# Patient Record
Sex: Female | Born: 1945 | Race: White | Hispanic: No | Marital: Married | Smoking: Former smoker
Health system: Southern US, Community
[De-identification: ages and names within clinical notes are randomized; demographics above are authoritative.]

## PROBLEM LIST (undated history)

## (undated) DIAGNOSIS — R42 Dizziness and giddiness: Secondary | ICD-10-CM

## (undated) DIAGNOSIS — Z9221 Personal history of antineoplastic chemotherapy: Secondary | ICD-10-CM

## (undated) DIAGNOSIS — R87619 Unspecified abnormal cytological findings in specimens from cervix uteri: Secondary | ICD-10-CM

## (undated) DIAGNOSIS — M810 Age-related osteoporosis without current pathological fracture: Secondary | ICD-10-CM

## (undated) DIAGNOSIS — Z87891 Personal history of nicotine dependence: Principal | ICD-10-CM

## (undated) DIAGNOSIS — K219 Gastro-esophageal reflux disease without esophagitis: Secondary | ICD-10-CM

## (undated) DIAGNOSIS — M1990 Unspecified osteoarthritis, unspecified site: Secondary | ICD-10-CM

## (undated) DIAGNOSIS — F988 Other specified behavioral and emotional disorders with onset usually occurring in childhood and adolescence: Secondary | ICD-10-CM

## (undated) DIAGNOSIS — A6000 Herpesviral infection of urogenital system, unspecified: Secondary | ICD-10-CM

## (undated) DIAGNOSIS — Z9049 Acquired absence of other specified parts of digestive tract: Secondary | ICD-10-CM

## (undated) DIAGNOSIS — E039 Hypothyroidism, unspecified: Secondary | ICD-10-CM

## (undated) DIAGNOSIS — L219 Seborrheic dermatitis, unspecified: Secondary | ICD-10-CM

## (undated) DIAGNOSIS — D649 Anemia, unspecified: Secondary | ICD-10-CM

## (undated) DIAGNOSIS — E785 Hyperlipidemia, unspecified: Secondary | ICD-10-CM

## (undated) DIAGNOSIS — I10 Essential (primary) hypertension: Secondary | ICD-10-CM

## (undated) DIAGNOSIS — C50919 Malignant neoplasm of unspecified site of unspecified female breast: Secondary | ICD-10-CM

## (undated) DIAGNOSIS — M719 Bursopathy, unspecified: Secondary | ICD-10-CM

## (undated) DIAGNOSIS — Z87898 Personal history of other specified conditions: Secondary | ICD-10-CM

## (undated) DIAGNOSIS — K529 Noninfective gastroenteritis and colitis, unspecified: Secondary | ICD-10-CM

## (undated) DIAGNOSIS — M8580 Other specified disorders of bone density and structure, unspecified site: Secondary | ICD-10-CM

## (undated) DIAGNOSIS — B009 Herpesviral infection, unspecified: Secondary | ICD-10-CM

## (undated) HISTORY — DX: Unspecified abnormal cytological findings in specimens from cervix uteri: R87.619

## (undated) HISTORY — DX: Personal history of nicotine dependence: Z87.891

## (undated) HISTORY — PX: TUBAL LIGATION: SHX77

## (undated) HISTORY — PX: CERVICAL BIOPSY  W/ LOOP ELECTRODE EXCISION: SUR135

## (undated) HISTORY — DX: Other specified disorders of bone density and structure, unspecified site: M85.80

## (undated) HISTORY — DX: Herpesviral infection of urogenital system, unspecified: A60.00

## (undated) HISTORY — DX: Essential (primary) hypertension: I10

## (undated) HISTORY — DX: Age-related osteoporosis without current pathological fracture: M81.0

## (undated) HISTORY — PX: ENDOMETRIAL BIOPSY: SHX622

## (undated) HISTORY — DX: Herpesviral infection, unspecified: B00.9

## (undated) HISTORY — DX: Bursopathy, unspecified: M71.9

## (undated) HISTORY — DX: Malignant neoplasm of unspecified site of unspecified female breast: C50.919

## (undated) HISTORY — PX: DILATION AND CURETTAGE OF UTERUS: SHX78

## (undated) HISTORY — PX: APPENDECTOMY: SHX54

## (undated) HISTORY — DX: Anemia, unspecified: D64.9

## (undated) HISTORY — DX: Acquired absence of other specified parts of digestive tract: Z90.49

## (undated) HISTORY — PX: MASTECTOMY: SHX3

## (undated) HISTORY — PX: COSMETIC SURGERY: SHX468

## (undated) HISTORY — PX: TONSILLECTOMY: SUR1361

## (undated) HISTORY — DX: Hyperlipidemia, unspecified: E78.5

## (undated) HISTORY — PX: OTHER SURGICAL HISTORY: SHX169

## (undated) HISTORY — PX: CARDIAC CATHETERIZATION: SHX172

## (undated) HISTORY — DX: Noninfective gastroenteritis and colitis, unspecified: K52.9

---

## 1979-03-24 DIAGNOSIS — Z9049 Acquired absence of other specified parts of digestive tract: Secondary | ICD-10-CM

## 1979-03-24 HISTORY — DX: Acquired absence of other specified parts of digestive tract: Z90.49

## 1980-03-23 HISTORY — PX: AUGMENTATION MAMMAPLASTY: SUR837

## 2003-03-24 HISTORY — PX: BREAST SURGERY: SHX581

## 2004-03-23 HISTORY — PX: BREAST SURGERY: SHX581

## 2004-07-08 ENCOUNTER — Ambulatory Visit: Payer: Self-pay | Admitting: Internal Medicine

## 2004-07-11 ENCOUNTER — Ambulatory Visit: Payer: Self-pay | Admitting: Internal Medicine

## 2004-07-25 ENCOUNTER — Ambulatory Visit: Payer: Self-pay

## 2004-07-31 ENCOUNTER — Ambulatory Visit: Payer: Self-pay | Admitting: Internal Medicine

## 2004-08-05 ENCOUNTER — Ambulatory Visit: Payer: Self-pay | Admitting: Internal Medicine

## 2004-08-26 ENCOUNTER — Ambulatory Visit: Payer: Self-pay | Admitting: Internal Medicine

## 2004-09-01 ENCOUNTER — Ambulatory Visit: Payer: Self-pay | Admitting: Internal Medicine

## 2004-10-14 ENCOUNTER — Other Ambulatory Visit: Admission: RE | Admit: 2004-10-14 | Discharge: 2004-10-14 | Payer: Self-pay | Admitting: Family Medicine

## 2004-11-13 ENCOUNTER — Ambulatory Visit: Payer: Self-pay | Admitting: Internal Medicine

## 2005-02-23 ENCOUNTER — Ambulatory Visit: Payer: Self-pay | Admitting: Internal Medicine

## 2008-03-05 ENCOUNTER — Other Ambulatory Visit: Admission: RE | Admit: 2008-03-05 | Discharge: 2008-03-05 | Payer: Self-pay | Admitting: Obstetrics & Gynecology

## 2008-03-13 ENCOUNTER — Encounter: Admission: RE | Admit: 2008-03-13 | Discharge: 2008-03-13 | Payer: Self-pay | Admitting: Obstetrics & Gynecology

## 2008-03-22 ENCOUNTER — Encounter: Admission: RE | Admit: 2008-03-22 | Discharge: 2008-03-22 | Payer: Self-pay | Admitting: Obstetrics & Gynecology

## 2008-05-23 ENCOUNTER — Ambulatory Visit: Payer: Self-pay

## 2009-02-21 ENCOUNTER — Ambulatory Visit: Payer: Self-pay | Admitting: Family Medicine

## 2009-02-21 DIAGNOSIS — E7800 Pure hypercholesterolemia, unspecified: Secondary | ICD-10-CM | POA: Insufficient documentation

## 2009-02-21 DIAGNOSIS — F900 Attention-deficit hyperactivity disorder, predominantly inattentive type: Secondary | ICD-10-CM | POA: Insufficient documentation

## 2009-02-21 DIAGNOSIS — J309 Allergic rhinitis, unspecified: Secondary | ICD-10-CM | POA: Insufficient documentation

## 2009-03-21 ENCOUNTER — Encounter: Admission: RE | Admit: 2009-03-21 | Discharge: 2009-03-21 | Payer: Self-pay | Admitting: Unknown Physician Specialty

## 2009-03-26 DIAGNOSIS — A6000 Herpesviral infection of urogenital system, unspecified: Secondary | ICD-10-CM | POA: Insufficient documentation

## 2009-03-26 HISTORY — DX: Herpesviral infection of urogenital system, unspecified: A60.00

## 2009-07-01 DIAGNOSIS — I10 Essential (primary) hypertension: Secondary | ICD-10-CM | POA: Insufficient documentation

## 2009-11-13 ENCOUNTER — Inpatient Hospital Stay: Payer: Self-pay | Admitting: Internal Medicine

## 2009-11-13 ENCOUNTER — Ambulatory Visit: Payer: Self-pay | Admitting: Cardiovascular Disease

## 2009-11-14 ENCOUNTER — Encounter: Payer: Self-pay | Admitting: Cardiovascular Disease

## 2009-11-15 ENCOUNTER — Encounter: Payer: Self-pay | Admitting: Cardiovascular Disease

## 2009-11-29 ENCOUNTER — Ambulatory Visit: Payer: Self-pay | Admitting: Family Medicine

## 2009-12-04 ENCOUNTER — Telehealth: Payer: Self-pay | Admitting: Cardiovascular Disease

## 2010-03-26 ENCOUNTER — Encounter
Admission: RE | Admit: 2010-03-26 | Discharge: 2010-03-26 | Payer: Self-pay | Source: Home / Self Care | Attending: Family Medicine | Admitting: Family Medicine

## 2010-04-02 ENCOUNTER — Encounter
Admission: RE | Admit: 2010-04-02 | Discharge: 2010-04-02 | Payer: Self-pay | Source: Home / Self Care | Attending: Family Medicine | Admitting: Family Medicine

## 2010-04-13 ENCOUNTER — Encounter: Payer: Self-pay | Admitting: Obstetrics & Gynecology

## 2010-04-22 NOTE — Progress Notes (Signed)
Summary: Leg  ---- Converted from flag ---- ---- 11/25/2009 1:55 PM, Dossie Arbour MD wrote: Can we call to see how her leg is? She had post cath hematoma. Does she want to be seen for quick look at leg?> ------------------------------  Phone Note Outgoing Call   Call placed by: Benedict Needy, RN,  December 04, 2009 2:18 PM Call placed to: Patient Summary of Call: Attempted to call to check on her leg. Pt has post cath hemotoma. Number is not a working number.  Initial call taken by: Benedict Needy, RN,  December 04, 2009 2:19 PM

## 2010-04-22 NOTE — Cardiovascular Report (Signed)
[facility]   Imported By: Harlon Flor 11/21/2009 14:17:49  _____________________________________________________________________  External Attachment:    Type:   Image     Comment:   External Document

## 2010-04-22 NOTE — Letter (Signed)
[facility]   Imported By: Harlon Flor 11/21/2009 14:18:07  _____________________________________________________________________  External Attachment:    Type:   Image     Comment:   External Document

## 2011-02-20 ENCOUNTER — Other Ambulatory Visit: Payer: Self-pay | Admitting: Family Medicine

## 2011-02-20 DIAGNOSIS — Z1231 Encounter for screening mammogram for malignant neoplasm of breast: Secondary | ICD-10-CM

## 2011-03-30 ENCOUNTER — Ambulatory Visit
Admission: RE | Admit: 2011-03-30 | Discharge: 2011-03-30 | Disposition: A | Discharge status: Home / Self Care | Payer: Federal, State, Local not specified - PPO | Source: Ambulatory Visit | Attending: Family Medicine | Admitting: Family Medicine

## 2011-03-30 DIAGNOSIS — Z1231 Encounter for screening mammogram for malignant neoplasm of breast: Secondary | ICD-10-CM

## 2012-02-16 ENCOUNTER — Ambulatory Visit: Payer: Self-pay

## 2012-02-25 ENCOUNTER — Other Ambulatory Visit: Payer: Self-pay | Admitting: Family Medicine

## 2012-02-25 DIAGNOSIS — Z1231 Encounter for screening mammogram for malignant neoplasm of breast: Secondary | ICD-10-CM

## 2012-03-10 ENCOUNTER — Ambulatory Visit: Payer: Self-pay

## 2012-04-06 ENCOUNTER — Ambulatory Visit
Admission: RE | Admit: 2012-04-06 | Discharge: 2012-04-06 | Disposition: A | Discharge status: Home / Self Care | Payer: Federal, State, Local not specified - PPO | Source: Ambulatory Visit | Attending: Family Medicine | Admitting: Family Medicine

## 2012-04-06 DIAGNOSIS — Z1231 Encounter for screening mammogram for malignant neoplasm of breast: Secondary | ICD-10-CM

## 2012-05-04 ENCOUNTER — Ambulatory Visit: Payer: Self-pay | Admitting: Family Medicine

## 2012-05-04 LAB — URINALYSIS, COMPLETE
Bilirubin,UR: NEGATIVE
Glucose,UR: NEGATIVE mg/dL (ref 0–75)
Ketone: NEGATIVE
Ph: 7 (ref 4.5–8.0)
Specific Gravity: 1.01 (ref 1.003–1.030)

## 2012-06-17 ENCOUNTER — Encounter: Payer: Self-pay | Admitting: *Deleted

## 2012-06-20 ENCOUNTER — Encounter: Payer: Self-pay | Admitting: Gynecology

## 2012-06-20 ENCOUNTER — Other Ambulatory Visit: Payer: Self-pay | Admitting: Gynecology

## 2012-06-20 ENCOUNTER — Ambulatory Visit (INDEPENDENT_AMBULATORY_CARE_PROVIDER_SITE_OTHER): Payer: Federal, State, Local not specified - PPO | Admitting: Gynecology

## 2012-06-20 VITALS — BP 130/64 | Ht 63.78 in | Wt 147.0 lb

## 2012-06-20 DIAGNOSIS — Z87898 Personal history of other specified conditions: Secondary | ICD-10-CM

## 2012-06-20 DIAGNOSIS — Z01419 Encounter for gynecological examination (general) (routine) without abnormal findings: Secondary | ICD-10-CM

## 2012-06-20 DIAGNOSIS — N952 Postmenopausal atrophic vaginitis: Secondary | ICD-10-CM

## 2012-06-20 DIAGNOSIS — Z8742 Personal history of other diseases of the female genital tract: Secondary | ICD-10-CM

## 2012-06-20 DIAGNOSIS — Z853 Personal history of malignant neoplasm of breast: Secondary | ICD-10-CM

## 2012-06-20 MED ORDER — ESTRADIOL 10 MCG VA TABS: 10.0000 ug | ORAL_TABLET | VAGINAL | Status: DC

## 2012-06-20 NOTE — Progress Notes (Addendum)
67 y.o. MarriedCaucasian female   G1P1001 here for annual exam.  Overall doing well, no postmeopausal bleeding, no dysparunia  Patient's last menstrual period was 03/23/1994.          Sexually active: yes  The current method of family planning is post menopausal status.    Exercising: walk; golf, yoga 3x/wk Last mammogram:  1/14 Last pap smear: 06/15/11 History of abnormal pap: no Smoking: No Alcohol: No Last colonoscopy: 2007 f/u in 10 years Last Bone Density:  2009 Last tetanus shot: 2013 Last cholesterol check: 2013 BSE: yes  Hgb:  PCP             Urine: PCP    Health Maintenance  Topic Date Due  . Influenza Vaccine  11/21/2012  . Mammogram  04/06/2013  . Colonoscopy  03/24/2015  . Pap Smear  06/21/2015  . Tetanus/tdap  08/21/2020  . Zostavax  Completed    Family History  Problem Relation Age of Onset  . Breast cancer Mother 73  . Heart disease Father   . Skin cancer Brother     Patient Active Problem List  Diagnosis  . Encounter for routine gynecological examination  . Personal history of malignant neoplasm of breast  . History of abnormal Pap smear    Past Medical History  Diagnosis Date  . HSV-2 (herpes simplex virus 2) infection   . Hyperlipidemia   . Hypertension   . Breast cancer     Left; Age 67  . S/P appendectomy 1981  . Anemia     Past Surgical History  Procedure Laterality Date  . Tonsillectomy  child  . Tubal ligation    . Cesarean section  1985  . Mastectomy Left     new implant on the Right  . Augmentation mammaplasty  1982  . Breast surgery  2005    Breast Implant replaceddue to leaking  . Breast surgery Left 2006    due to asymmetry  . Cervical biopsy  w/ loop electrode excision      unsure pathology     Allergies: Review of patient's allergies indicates no known allergies.  Current Outpatient Prescriptions  Medication Sig Dispense Refill  . amphetamine-dextroamphetamine (ADDERALL) 10 MG tablet Take 10 mg by mouth daily.       . Ascorbic Acid (VITAMIN C PO) Take by mouth.      . cholecalciferol (VITAMIN D) 1000 UNITS tablet Take 1,000 Units by mouth daily.      . cyanocobalamin (,VITAMIN B-12,) 1000 MCG/ML injection Inject 1,000 mcg into the muscle once a week.      . Estradiol (VAGIFEM) 10 MCG TABS Place 1 tablet (10 mcg total) vaginally 2 (two) times a week.  26 tablet  3  . IRON PO Take by mouth.      . losartan (COZAAR) 50 MG tablet Take 50 mg by mouth daily.      . Multiple Vitamins-Minerals (MULTIVITAMIN PO) Take by mouth.      . Probiotic Product (PROBIOTIC DAILY PO) Take by mouth.      . ValACYclovir HCl (VALTREX PO) Take by mouth as needed.      Marland Kitchen amoxicillin-clavulanate (AUGMENTIN) 875-125 MG per tablet       . hydrOXYzine (ATARAX/VISTARIL) 25 MG tablet        No current facility-administered medications for this visit.    ROS: Pertinent items are noted in HPI.  Exam:    BP 130/64  Ht 5' 3.78" (1.62 m)  Wt 147 lb [phone]  kg)  BMI 25.41 kg/m2  LMP 03/23/1994   Wt Readings from Last 3 Encounters:  06/20/12 147 lb (66.679 kg)     Ht Readings from Last 3 Encounters:  06/20/12 5' 3.78" (1.62 m)    General appearance: alert, cooperative and appears stated age Head: Normocephalic, without obvious abnormality, atraumatic Neck: no adenopathy, supple, symmetrical, trachea midline and thyroid not enlarged, symmetric, no tenderness/mass/nodules Lungs: clear to auscultation bilaterally Breasts:Right s/p implant-no mass, discharge or skin changes No nipple retraction or dimpling, No nipple discharge or bleeding, No axillary or supraclavicular adenopathy, Normal to palpation without dominant masses.  Left surgically absent-no subclavicular masses or skin changes Heart: regular rate and rhythm Abdomen: soft, non-tender; bowel sounds normal; no masses,  no organomegaly Extremities: extremities normal, atraumatic, no cyanosis or edema Skin: Skin color, texture, turgor normal. No rashes or lesions Lymph  nodes: Cervical, supraclavicular, and axillary nodes normal. No abnormal inguinal nodes palpated Neurologic: Grossly normal   Pelvic: External genitalia:  no lesions              Urethra:  normal appearing urethra with no masses, tenderness or lesions              Bartholins and Skenes: normal                 Vagina: normal appearing vagina with normal color and discharge, no lesions              Cervix: markedly scarred c/w prior LEEP,               Pap taken: yes        Bimanual Exam:  Uterus:  uterus is normal size, shape, consistency and nontender retroverted                                      Adnexa: no fullness                                      Rectovaginal: Confirms                                      Anus:  normal sphincter tone, no lesions  A: well woman History of abnormal PAP     P: mammogram pap smear with HR HPV S/p LEEP, almost 20y ago, will get records regarding path, if unavailable will continue PAP qyear x20 return annually or prn     An After Visit Summary was printed and given to the patient.  Pt called to update her history regarding dates on Tdap, zovirax, DEXA. Additionally pt reports having BRCA1/2 testing done 12/13-both negative 2003 cx bx-negative for CIN done in [place]

## 2012-06-20 NOTE — Patient Instructions (Signed)

## 2012-06-22 LAB — IPS PAP TEST WITH REFLEX TO HPV

## 2012-06-23 ENCOUNTER — Other Ambulatory Visit: Payer: Self-pay | Admitting: Gynecology

## 2012-06-23 NOTE — Addendum Note (Signed)
Addended by: Douglass Rivers on: 06/23/2012 01:57 PM   Modules accepted: Orders

## 2012-06-24 ENCOUNTER — Encounter: Payer: Self-pay | Admitting: Gynecology

## 2012-06-27 ENCOUNTER — Encounter: Payer: Self-pay | Admitting: Gynecology

## 2012-06-27 LAB — IPS HPV ON A LIQUID BASED SPECIMEN

## 2012-07-01 NOTE — Telephone Encounter (Signed)
Pt wanting to know if she should come back in because she had an abnormal pap result.

## 2012-07-04 ENCOUNTER — Encounter: Payer: Self-pay | Admitting: Gynecology

## 2012-07-07 ENCOUNTER — Encounter: Payer: Self-pay | Admitting: Gynecology

## 2012-08-09 ENCOUNTER — Ambulatory Visit (INDEPENDENT_AMBULATORY_CARE_PROVIDER_SITE_OTHER): Payer: Federal, State, Local not specified - PPO | Admitting: Gynecology

## 2012-08-09 VITALS — BP 130/76 | Resp 18 | Wt 149.0 lb

## 2012-08-09 DIAGNOSIS — N762 Acute vulvitis: Secondary | ICD-10-CM

## 2012-08-09 DIAGNOSIS — N760 Acute vaginitis: Secondary | ICD-10-CM

## 2012-08-09 LAB — POCT WET PREP (WET MOUNT)

## 2012-08-09 MED ORDER — DESONIDE 0.05 % EX OINT: TOPICAL_OINTMENT | Freq: Two times a day (BID) | CUTANEOUS | Status: DC

## 2012-08-09 NOTE — Progress Notes (Signed)
67 y.o.Married Caucasian female G1P1001 with a 2 week(s) history of the following:blisters, dyspareunia and local irritation Sexually active: yes Last sexual activity:2days ago. Pt also reports the following associated symptoms: none pt reports recent UTI, seen by PCP and rx with amoxicillin, reports initially got better but reports a second infection about 1w later treated without being seen also noted a single vesicle at that time.  Pt was also given an rx for a vaginal cream for yeast infection.  Pt has had HSV for years, last outbreak about 78m before, pt treated with valtrex 500mg  BID x3d then continued QD.       Exam:  Ext:see diagram of ext genitalia                     Vag:no lesions, discharge: normal and physiologic, pH 4.5, wet prep done                Cx:  scarred                Uterus:not examined                Adnexa: not evaluated  Wet Prep shows:vaginal discharge   ZO:XWRUEA vulvar lesion   VW:[id number] local care discussed. topical steroid to area F/u 10-14d

## 2012-08-09 NOTE — Patient Instructions (Signed)
Pea size amount of steroid to affected area.  EVOO or cocoanut oil prn

## 2013-01-04 ENCOUNTER — Ambulatory Visit: Payer: Self-pay | Admitting: Family Medicine

## 2013-01-09 ENCOUNTER — Other Ambulatory Visit: Payer: Self-pay

## 2013-01-09 NOTE — Telephone Encounter (Signed)
aex was 06-20-12 7 no rx was given then. Please approve or deny rx

## 2013-01-10 MED ORDER — VALACYCLOVIR HCL 500 MG PO TABS: 500.0000 mg | ORAL_TABLET | Freq: Two times a day (BID) | ORAL | Status: DC

## 2013-01-10 NOTE — Telephone Encounter (Signed)
Per Dr. Farrel Gobble patient only needs 30 since she only takes Valtrex 3 times a day @ onset of outbreak. If she needs more than that she should be taking it daily.  #30/0rfs sent to pharmacy.

## 2013-01-10 NOTE — Telephone Encounter (Signed)
done

## 2013-01-26 ENCOUNTER — Other Ambulatory Visit: Payer: Self-pay

## 2013-03-10 ENCOUNTER — Other Ambulatory Visit: Payer: Self-pay

## 2013-03-10 DIAGNOSIS — Z1231 Encounter for screening mammogram for malignant neoplasm of breast: Secondary | ICD-10-CM

## 2013-04-06 ENCOUNTER — Other Ambulatory Visit: Payer: Self-pay

## 2013-04-06 DIAGNOSIS — Z1231 Encounter for screening mammogram for malignant neoplasm of breast: Secondary | ICD-10-CM

## 2013-04-06 DIAGNOSIS — Z9882 Breast implant status: Secondary | ICD-10-CM

## 2013-04-18 DIAGNOSIS — Z9889 Other specified postprocedural states: Secondary | ICD-10-CM | POA: Insufficient documentation

## 2013-04-26 ENCOUNTER — Other Ambulatory Visit: Payer: Self-pay | Admitting: Family Medicine

## 2013-04-26 ENCOUNTER — Ambulatory Visit
Admission: RE | Admit: 2013-04-26 | Discharge: 2013-04-26 | Disposition: A | Discharge status: Home / Self Care | Payer: Medicare Other | Source: Ambulatory Visit

## 2013-04-26 DIAGNOSIS — Z9882 Breast implant status: Secondary | ICD-10-CM

## 2013-04-26 DIAGNOSIS — Z1231 Encounter for screening mammogram for malignant neoplasm of breast: Secondary | ICD-10-CM

## 2013-04-26 DIAGNOSIS — N63 Unspecified lump in unspecified breast: Secondary | ICD-10-CM

## 2013-04-26 DIAGNOSIS — Z9012 Acquired absence of left breast and nipple: Secondary | ICD-10-CM

## 2013-05-05 ENCOUNTER — Ambulatory Visit
Admission: RE | Admit: 2013-05-05 | Discharge: 2013-05-05 | Disposition: A | Discharge status: Home / Self Care | Payer: Federal, State, Local not specified - PPO | Source: Ambulatory Visit | Attending: Family Medicine | Admitting: Family Medicine

## 2013-05-05 ENCOUNTER — Other Ambulatory Visit: Payer: Self-pay | Admitting: Family Medicine

## 2013-05-05 DIAGNOSIS — N6310 Unspecified lump in the right breast, unspecified quadrant: Secondary | ICD-10-CM

## 2013-05-05 DIAGNOSIS — N63 Unspecified lump in unspecified breast: Secondary | ICD-10-CM

## 2013-05-05 DIAGNOSIS — Z9012 Acquired absence of left breast and nipple: Secondary | ICD-10-CM

## 2013-05-18 ENCOUNTER — Other Ambulatory Visit: Payer: Self-pay | Admitting: Gynecology

## 2013-05-19 NOTE — Telephone Encounter (Signed)
Last AEX 06/20/12 Last refill 01/09/13 #30/0 refill Next appt 07/06/13  Please approve or deny rx.

## 2013-05-21 DIAGNOSIS — R87619 Unspecified abnormal cytological findings in specimens from cervix uteri: Secondary | ICD-10-CM

## 2013-05-21 HISTORY — DX: Unspecified abnormal cytological findings in specimens from cervix uteri: R87.619

## 2013-06-26 ENCOUNTER — Ambulatory Visit: Payer: Federal, State, Local not specified - PPO | Admitting: Gynecology

## 2013-07-11 ENCOUNTER — Ambulatory Visit: Payer: Federal, State, Local not specified - PPO | Admitting: Certified Nurse Midwife

## 2013-07-25 ENCOUNTER — Encounter: Payer: Self-pay | Admitting: Certified Nurse Midwife

## 2013-07-25 ENCOUNTER — Ambulatory Visit (INDEPENDENT_AMBULATORY_CARE_PROVIDER_SITE_OTHER): Payer: Federal, State, Local not specified - PPO | Admitting: Certified Nurse Midwife

## 2013-07-25 VITALS — BP 120/70 | HR 68 | Resp 16 | Ht 62.75 in | Wt 137.0 lb

## 2013-07-25 DIAGNOSIS — Z01419 Encounter for gynecological examination (general) (routine) without abnormal findings: Secondary | ICD-10-CM

## 2013-07-25 DIAGNOSIS — Z1211 Encounter for screening for malignant neoplasm of colon: Secondary | ICD-10-CM

## 2013-07-25 DIAGNOSIS — N952 Postmenopausal atrophic vaginitis: Secondary | ICD-10-CM

## 2013-07-25 DIAGNOSIS — Z0000 Encounter for general adult medical examination without abnormal findings: Secondary | ICD-10-CM

## 2013-07-25 LAB — POCT URINALYSIS DIPSTICK
Bilirubin, UA: NEGATIVE
Glucose, UA: NEGATIVE
KETONES UA: NEGATIVE
Leukocytes, UA: NEGATIVE
Nitrite, UA: NEGATIVE
PH UA: 5
Protein, UA: NEGATIVE
RBC UA: NEGATIVE
Urobilinogen, UA: NEGATIVE

## 2013-07-25 MED ORDER — ESTRADIOL 10 MCG VA TABS: 10.0000 ug | ORAL_TABLET | VAGINAL | Status: DC

## 2013-07-25 NOTE — Progress Notes (Signed)
68 y.o. G10P1001 Married Caucasian Fe here for annual exam.  Menopausal no HRT, denies vaginal bleeding, using Vagifem for vaginal dryness with good result. Mother injured in [place] 01/19/13. Had numerous fractures, but doing well. Stressful time for patient. Sees PCP for aex and medication management, and labs, no medication changes from PCP. No other health issues today.  Patient's last menstrual period was 03/23/1994.          Sexually active: yes  The current method of family planning is tubal ligation.    Exercising: yes  golf, gardening & walking Smoker:  no  Health Maintenance: Pap: 06-20-12 ASCUS HPV- MMG:  2015 rt breast negative Colonoscopy: 2007 f/u 8yrs BMD:   2015 TDaP:  2011 Labs: Poct urine-neg Self breast exam: done occ   reports that she has quit smoking. She does not have any smokeless tobacco history on file. She reports that she does not drink alcohol or use illicit drugs.  Past Medical History  Diagnosis Date  . HSV-2 (herpes simplex virus 2) infection   . Hyperlipidemia   . Hypertension   . Breast cancer     Left; Age 14  . S/P appendectomy 1981  . Anemia   . Abnormal Pap smear of cervix 3/15    ASCUS HPV not detected    Past Surgical History  Procedure Laterality Date  . Tonsillectomy  child  . Tubal ligation    . Cesarean section  1985  . Mastectomy Left     new implant on the Right  . Augmentation mammaplasty  1982  . Breast surgery  2005    Breast Implant replaceddue to leaking  . Breast surgery Left 2006    due to asymmetry  . Cervical biopsy  w/ loop electrode excision      unsure pathology   . Appendectomy      Current Outpatient Prescriptions  Medication Sig Dispense Refill  . ALPRAZolam (XANAX) 0.5 MG tablet at bedtime.      Marland Kitchen amphetamine-dextroamphetamine (ADDERALL) 20 MG tablet Take 20 mg by mouth daily.      . Ascorbic Acid (VITAMIN C PO) Take by mouth.      . cholecalciferol (VITAMIN D) 1000 UNITS tablet Take 1,000 Units by mouth  daily.      . cyanocobalamin (,VITAMIN B-12,) 1000 MCG/ML injection Inject 1,000 mcg into the muscle once a week.      . desonide (DESOWEN) 0.05 % ointment Apply topically as needed.      . Estradiol (VAGIFEM) 10 MCG TABS Place 1 tablet (10 mcg total) vaginally 2 (two) times a week.  26 tablet  3  . IRON PO Take by mouth.      . losartan (COZAAR) 50 MG tablet Take 50 mg by mouth daily.      . Multiple Vitamins-Minerals (MULTIVITAMIN PO) Take by mouth.      . S-Adenosylmethionine (SAM-E PO) Take by mouth daily.      . valACYclovir (VALTREX) 500 MG tablet TAKE 1 TABLET BY MOUTH 2 TIMES A DAY FOR 3 DAYS AT ONSET  30 tablet  0   No current facility-administered medications for this visit.    Family History  Problem Relation Age of Onset  . Breast cancer Mother 80  . Heart disease Father   . Skin cancer Brother   . Crohn's disease Brother     ROS:  Pertinent items are noted in HPI.  Otherwise, a comprehensive ROS was negative.  Exam:   BP 120/70  Pulse  68  Resp 16  Ht 5' 2.75" (1.594 m)  Wt 137 lb (62.143 kg)  BMI 24.46 kg/m2  LMP 03/23/1994 Height: 5' 2.75" (159.4 cm)  Ht Readings from Last 3 Encounters:  07/25/13 5' 2.75" (1.594 m)  06/20/12 5' 3.78" (1.62 m)    General appearance: alert, cooperative and appears stated age Head: Normocephalic, without obvious abnormality, atraumatic Neck: no adenopathy, supple, symmetrical, trachea midline and thyroid normal to inspection and palpation Lungs: clear to auscultation bilaterally Breasts: normal appearance, no masses or tenderness, No nipple retraction or dimpling, No nipple discharge or bleeding, No axillary or supraclavicular adenopathy on right with implant noted. Left transflap with tattoo, no change in skin or masses noted, implant noted intact. Heart: regular rate and rhythm Abdomen: soft, non-tender; no masses,  no organomegaly Extremities: extremities normal, atraumatic, no cyanosis or edema Skin: Skin color, texture,  turgor normal. No rashes or lesions Lymph nodes: Cervical, supraclavicular, and axillary nodes normal. No abnormal inguinal nodes palpated Neurologic: Grossly normal   Pelvic: External genitalia:  no lesions              Urethra:  normal appearing urethra with no masses, tenderness or lesions              Bartholin's and Skene's: normal                 Vagina: normal appearing vagina with normal color and discharge, no lesions              Cervix: flat appearance from LEEP, non tender, scant blood from pap              Pap taken: yes Bimanual Exam:  Uterus:  normal size, contour, position, consistency, mobility, non-tender and anteverted              Adnexa: normal adnexa and no mass, fullness, tenderness               Rectovaginal: Confirms               Anus:  normal sphincter tone, no lesions  A:  Well Woman with normal exam  Menopausal no HRT  Atrophic vaginitis Vagifem working well to control  History of abnomal pap with [place],? Date(many years ago) unknown findings   Previous pap 2014 ASCUS - HPVHR  History of Left breast cancer with Transflap, last mammogram normal  Hypertension stable medication with PCP management  History of HSV 2 has Valtrex for use, occasional outbreak, does not need RX  Social stress with mother accident/care  P:   Reviewed health and wellness pertinent to exam  Aware of need to evaluate if vaginal bleeding  Rx Vagifem see order  [facility] smear taken today with [facility]  Stressed importance of SBE  Mammogram yearly and as indicated  Continue follow up with PCP as indicated  Encouraged to seek friend and family support as needed.   counseled on breast self exam, mammography screening, adequate intake of calcium and vitamin D, diet and exercise. IFOB dispensed, colonoscopy due in 2017.  return annually or prn  An After Visit Summary was printed and given to the patient.

## 2013-07-25 NOTE — Progress Notes (Signed)
Reviewed personally.  M. Suzanne Marcio Hoque, MD.  

## 2013-07-25 NOTE — Patient Instructions (Signed)

## 2013-07-28 LAB — IPS PAP TEST WITH HPV

## 2013-08-08 ENCOUNTER — Telehealth: Payer: Self-pay | Admitting: Certified Nurse Midwife

## 2013-08-08 NOTE — Telephone Encounter (Signed)
Pt is calling to get her results from her last visit.

## 2013-08-09 NOTE — Telephone Encounter (Signed)
Spoke with patient. Results given as seen below. Patient agreeable and verbalizes understanding.  Notes Recorded by Regina Eck, CNM on 07/30/2013 at 6:15 PM Pap smear reviewed negative, HPVHR not detected 02  Routing to provider for final review. Patient agreeable to disposition. Will close encounter

## 2013-08-16 LAB — FECAL OCCULT BLOOD, IMMUNOCHEMICAL: IMMUNOLOGICAL FECAL OCCULT BLOOD TEST: NEGATIVE

## 2014-01-22 ENCOUNTER — Encounter: Payer: Self-pay | Admitting: Certified Nurse Midwife

## 2014-02-14 ENCOUNTER — Other Ambulatory Visit: Payer: Self-pay | Admitting: *Deleted

## 2014-02-14 MED ORDER — VALACYCLOVIR HCL 500 MG PO TABS: ORAL_TABLET | ORAL | Status: DC

## 2014-02-14 NOTE — Addendum Note (Signed)
Addended by: Alfonzo Feller on: 02/14/2014 04:55 PM   Modules accepted: Orders

## 2014-02-14 NOTE — Telephone Encounter (Signed)
Fax From: [facility] for Valtrex 500 mg #30/0 rfs Last Refilled:  05/19/13 #30/0 rfs by Dr. Charlies Constable Last AEX: 07/25/13 with Ms. Debbie rx was not needed at the time. Aex Scheduled: 08/06/14 with Ms. Debbie  Please advise okay to send x 1 year?

## 2014-04-11 ENCOUNTER — Other Ambulatory Visit: Payer: Self-pay

## 2014-04-11 DIAGNOSIS — Z1231 Encounter for screening mammogram for malignant neoplasm of breast: Secondary | ICD-10-CM

## 2014-04-20 ENCOUNTER — Ambulatory Visit: Payer: Self-pay | Admitting: Family Medicine

## 2014-05-08 ENCOUNTER — Ambulatory Visit: Payer: Federal, State, Local not specified - PPO

## 2014-05-09 ENCOUNTER — Other Ambulatory Visit: Payer: Self-pay

## 2014-05-09 ENCOUNTER — Ambulatory Visit
Admission: RE | Admit: 2014-05-09 | Discharge: 2014-05-09 | Disposition: A | Discharge status: Home / Self Care | Payer: Federal, State, Local not specified - PPO | Source: Ambulatory Visit

## 2014-05-09 DIAGNOSIS — Z1231 Encounter for screening mammogram for malignant neoplasm of breast: Secondary | ICD-10-CM

## 2014-07-11 ENCOUNTER — Other Ambulatory Visit: Payer: Self-pay | Admitting: Certified Nurse Midwife

## 2014-07-12 NOTE — Telephone Encounter (Signed)
02/14/14 #30/11 rfs was sent to [place].

## 2014-08-02 ENCOUNTER — Other Ambulatory Visit: Payer: Self-pay | Admitting: Certified Nurse Midwife

## 2014-08-02 NOTE — Telephone Encounter (Signed)
Medication refill request: Valtrex  Last AEX:  07/25/13 DL Next AEX: 08/06/14 DL Last MMG (if hormonal medication request): 05/09/14 [id number]:Neg Refill authorized: 02/14/14 #30tabs/ 11R To [facility] [place]

## 2014-08-06 ENCOUNTER — Encounter: Payer: Self-pay | Admitting: Certified Nurse Midwife

## 2014-08-06 ENCOUNTER — Ambulatory Visit (INDEPENDENT_AMBULATORY_CARE_PROVIDER_SITE_OTHER): Payer: Federal, State, Local not specified - PPO | Admitting: Certified Nurse Midwife

## 2014-08-06 VITALS — BP 130/72 | HR 72 | Resp 20 | Ht 62.75 in | Wt 140.0 lb

## 2014-08-06 DIAGNOSIS — Z01419 Encounter for gynecological examination (general) (routine) without abnormal findings: Secondary | ICD-10-CM | POA: Diagnosis not present

## 2014-08-06 DIAGNOSIS — A609 Anogenital herpesviral infection, unspecified: Secondary | ICD-10-CM

## 2014-08-06 DIAGNOSIS — Z124 Encounter for screening for malignant neoplasm of cervix: Secondary | ICD-10-CM | POA: Diagnosis not present

## 2014-08-06 DIAGNOSIS — Z0000 Encounter for general adult medical examination without abnormal findings: Secondary | ICD-10-CM

## 2014-08-06 DIAGNOSIS — A6009 Herpesviral infection of other urogenital tract: Secondary | ICD-10-CM

## 2014-08-06 DIAGNOSIS — N952 Postmenopausal atrophic vaginitis: Secondary | ICD-10-CM | POA: Diagnosis not present

## 2014-08-06 LAB — POCT URINALYSIS DIPSTICK
BILIRUBIN UA: NEGATIVE
Blood, UA: NEGATIVE
Glucose, UA: NEGATIVE
KETONES UA: NEGATIVE
Leukocytes, UA: NEGATIVE
Nitrite, UA: NEGATIVE
PH UA: 5
PROTEIN UA: NEGATIVE
Urobilinogen, UA: NEGATIVE

## 2014-08-06 MED ORDER — ESTRADIOL 10 MCG VA TABS: 10.0000 ug | ORAL_TABLET | VAGINAL | Status: DC

## 2014-08-06 MED ORDER — VALACYCLOVIR HCL 500 MG PO TABS: ORAL_TABLET | ORAL | Status: DC

## 2014-08-06 NOTE — Patient Instructions (Signed)

## 2014-08-06 NOTE — Progress Notes (Signed)
69 y.o. G77P1001 Married  Caucasian Fe here for annual exam. Menopausal no HRT.  Denies vaginal bleeding or vaginal dryness. Vaginal dryness better. Has had several outbreaks in past year at least 1-2 a month. 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 No health issues today.  Patient's last menstrual period was 03/23/1994.          Sexually active: Yes.    The current method of family planning is post menopausal status.    Exercising: Yes.    golf,walk Smoker:  no  Health Maintenance: Pap: 07-25-13 neg HPV HR neg MMG: 05-09-14 category b density,birads 1;neg Colonoscopy:  2007 f/u 3yrs BMD:  2015 TDaP:  2011 Labs: Poct urine-neg Self breast exam:done occ   reports that she has quit smoking. She does not have any smokeless tobacco history on file. She reports that she does not drink alcohol or use illicit drugs.  Past Medical History  Diagnosis Date  . HSV-2 (herpes simplex virus 2) infection   . Hyperlipidemia   . Hypertension   . Breast cancer     Left; Age 64  . S/P appendectomy 1981  . Anemia   . Abnormal Pap smear of cervix 3/15    ASCUS HPV not detected    Past Surgical History  Procedure Laterality Date  . Tonsillectomy  child  . Tubal ligation    . Cesarean section  1985  . Mastectomy Left     new implant on the Right  . Augmentation mammaplasty  1982  . Breast surgery  2005    Breast Implant replaceddue to leaking  . Breast surgery Left 2006    due to asymmetry  . Cervical biopsy  w/ loop electrode excision      unsure pathology   . Appendectomy      Current Outpatient Prescriptions  Medication Sig Dispense Refill  . ALPRAZolam (XANAX) 0.5 MG tablet at bedtime. Takes 1/2    . amphetamine-dextroamphetamine (ADDERALL) 20 MG tablet Take 20 mg by mouth daily.    . Ascorbic Acid (VITAMIN C PO) Take by mouth.    . cholecalciferol  (VITAMIN D) 1000 UNITS tablet Take 1,000 Units by mouth daily.    . cyanocobalamin (,VITAMIN B-12,) 1000 MCG/ML injection Inject 1,000 mcg into the muscle once a week.    . desonide (DESOWEN) 0.05 % ointment Apply topically as needed.    . Estradiol (VAGIFEM) 10 MCG TABS vaginal tablet Place 1 tablet (10 mcg total) vaginally 2 (two) times a week. 24 tablet 4  . levothyroxine (SYNTHROID, LEVOTHROID) 75 MCG tablet Take 75 mcg by mouth daily before breakfast.    . losartan (COZAAR) 50 MG tablet Take 50 mg by mouth daily.    . Multiple Vitamins-Minerals (MULTIVITAMIN PO) Take by mouth.    . S-Adenosylmethionine (SAM-E PO) Take by mouth daily.    Marland Kitchen UNABLE TO FIND daily. vitamins    . valACYclovir (VALTREX) 500 MG tablet One daily and increase to 2 x daily x 3 d at onset of outbreak 45 tablet 12   No current facility-administered medications for this visit.    Family History  Problem Relation Age of Onset  . Breast cancer Mother 75  . Heart disease Father   . Skin cancer Brother   . Crohn's disease Brother     ROS:  Pertinent items are noted in HPI.  Otherwise, a comprehensive ROS was negative.  Exam:   BP 130/72 mmHg  Pulse 72  Resp 20  Ht 5' 2.75" (1.594 m)  Wt 140 lb (63.504 kg)  BMI 24.99 kg/m2  LMP 03/23/1994 Height: 5' 2.75" (159.4 cm) Ht Readings from Last 3 Encounters:  08/06/14 5' 2.75" (1.594 m)  07/25/13 5' 2.75" (1.594 m)  06/20/12 5' 3.78" (1.62 m)    General appearance: alert, cooperative and appears stated age Head: Normocephalic, without obvious abnormality, atraumatic Neck: no adenopathy, supple, symmetrical, trachea midline and thyroid normal to inspection and palpation Lungs: clear to auscultation bilaterally Breasts: normal appearance, no masses or tenderness, No nipple retraction or dimpling, No nipple discharge or bleeding, No axillary or supraclavicular adenopathy, transflap repair with implant noted on left Heart: regular rate and rhythm Abdomen: soft,  non-tender; no masses,  no organomegaly Extremities: extremities normal, atraumatic, no cyanosis or edema Skin: Skin color, texture, turgor normal. No rashes or lesions Lymph nodes: Cervical, supraclavicular, and axillary nodes normal. No abnormal inguinal nodes palpated Neurologic: Grossly normal   Pelvic: External genitalia:  no lesions              Urethra:  normal appearing urethra with no masses, tenderness or lesions              Bartholin's and Skene's: normal                 Vagina: normal appearing vagina with normal color and discharge, no lesions              Cervix: adhered to posterior vaginal wall, very little cervical appearance, no lesions              Pap taken: Yes.   Bimanual Exam:  Uterus:  normal size, contour, position, consistency, mobility, non-tender              Adnexa: normal adnexa and no mass, fullness, tenderness               Rectovaginal: Confirms               Anus:  normal sphincter tone, no lesions  Chaperone present: Yes  A:  Well Woman with normal exam  Post Menopausal no HRT  Atrophic vaginitis with Vagifem prn use  History of Breast cancer left age 64  Hypertension/cholesterol/hypothyroid stable medication with PCP management  HSV 2 frequent outbreaks  P:   Reviewed health and wellness pertinent to exam  Aware of need to evaluate if vaginal bleeding  Rx Vagifem see order, continue coconut oil use prn  Continue yearly mammograms and SBE  Follow up with MD as indicated  Discussed Valtrex suppression dosage daily to decrease outbreaks again. Patient would like to go on daily. Patient will advise in 3 months of outcome with use.  Rx Valtrex see order  Pap smear taken today with HPV reflex   counseled on breast self exam, mammography screening, menopause, adequate intake of calcium and vitamin D, diet and exercise  return annually or prn  An After Visit Summary was printed and given to the patient.

## 2014-08-07 LAB — IPS PAP TEST WITH REFLEX TO HPV

## 2014-08-07 NOTE — Progress Notes (Signed)
Reviewed personally.  M. Suzanne Hilari Wethington, MD.  

## 2014-09-04 ENCOUNTER — Telehealth: Payer: Self-pay | Admitting: Family Medicine

## 2014-09-04 NOTE — Telephone Encounter (Signed)
Needs TSH for hypothyroid. Probably good chart to abstract also. .Thanks

## 2014-09-04 NOTE — Telephone Encounter (Signed)
Pt is requesting a lab slip to recheck thyroid.  CB#[phone]

## 2014-09-05 ENCOUNTER — Other Ambulatory Visit: Payer: Self-pay | Admitting: Family Medicine

## 2014-09-05 DIAGNOSIS — G4700 Insomnia, unspecified: Secondary | ICD-10-CM

## 2014-09-08 ENCOUNTER — Other Ambulatory Visit: Payer: Self-pay | Admitting: Family Medicine

## 2014-09-08 DIAGNOSIS — G4700 Insomnia, unspecified: Secondary | ICD-10-CM

## 2014-09-09 ENCOUNTER — Other Ambulatory Visit: Payer: Self-pay | Admitting: Family Medicine

## 2014-09-09 DIAGNOSIS — E039 Hypothyroidism, unspecified: Secondary | ICD-10-CM

## 2014-09-10 ENCOUNTER — Other Ambulatory Visit: Payer: Self-pay

## 2014-09-10 ENCOUNTER — Other Ambulatory Visit: Payer: Self-pay | Admitting: Family Medicine

## 2014-09-10 DIAGNOSIS — E039 Hypothyroidism, unspecified: Secondary | ICD-10-CM

## 2014-09-15 LAB — TSH: TSH: 1.48 u[IU]/mL (ref 0.450–4.500)

## 2014-09-17 ENCOUNTER — Telehealth: Payer: Self-pay

## 2014-09-17 NOTE — Telephone Encounter (Signed)
LMTCB Kaydee Magel Drozdowski, CMA  

## 2014-09-17 NOTE — Telephone Encounter (Signed)
Informed pt of lab results. Renaldo Fiddler, CMA

## 2014-09-17 NOTE — Telephone Encounter (Signed)
-----   Message from Margarita Rana, MD sent at 09/15/2014  1:55 PM EDT ----- TSH normal. Please notify patient.  Thanks.

## 2014-11-06 ENCOUNTER — Other Ambulatory Visit: Payer: Self-pay | Admitting: Family Medicine

## 2014-12-19 ENCOUNTER — Other Ambulatory Visit: Payer: Self-pay | Admitting: Family Medicine

## 2014-12-19 DIAGNOSIS — I10 Essential (primary) hypertension: Secondary | ICD-10-CM

## 2014-12-20 NOTE — Telephone Encounter (Signed)
Last OV 06/2014  Thanks,   -Diezel Mazur  

## 2015-01-03 ENCOUNTER — Other Ambulatory Visit (INDEPENDENT_AMBULATORY_CARE_PROVIDER_SITE_OTHER): Payer: Federal, State, Local not specified - PPO | Admitting: Family Medicine

## 2015-01-03 ENCOUNTER — Ambulatory Visit (INDEPENDENT_AMBULATORY_CARE_PROVIDER_SITE_OTHER): Payer: Federal, State, Local not specified - PPO

## 2015-01-03 DIAGNOSIS — Z23 Encounter for immunization: Secondary | ICD-10-CM | POA: Diagnosis not present

## 2015-01-29 ENCOUNTER — Ambulatory Visit (INDEPENDENT_AMBULATORY_CARE_PROVIDER_SITE_OTHER): Payer: Federal, State, Local not specified - PPO | Admitting: Family Medicine

## 2015-01-29 ENCOUNTER — Encounter: Payer: Self-pay | Admitting: Family Medicine

## 2015-01-29 VITALS — BP 134/72 | HR 76 | Temp 97.6°F | Resp 16 | Ht 63.0 in | Wt 143.0 lb

## 2015-01-29 DIAGNOSIS — R252 Cramp and spasm: Secondary | ICD-10-CM | POA: Diagnosis not present

## 2015-01-29 DIAGNOSIS — M1990 Unspecified osteoarthritis, unspecified site: Secondary | ICD-10-CM

## 2015-01-29 DIAGNOSIS — E039 Hypothyroidism, unspecified: Secondary | ICD-10-CM

## 2015-01-29 NOTE — Progress Notes (Signed)
Patient ID: Adriana Hicks, female   DOB: 05/12/1945, 69 y.o.   MRN: [id number]       Patient: Adriana Hicks Female    DOB: [birth year]   69 y.o.   MRN: [id number] Visit Date: 01/29/2015  Today's Provider: Margarita Rana, MD   Chief Complaint  Patient presents with  . Joint Swelling    Describes it has a pain. Patient reports that its been going on for 2 weeks.    Subjective:    HPI Patient reports that she has had joint pain all over for about 2 weeks. She reports that the pain is constant, and she also has muscle pain. Patient reports that she has been taking Aleve, which has not helped.  Leg cramps at night. Also, with some hand pain. Knees are really sore. Knees and thighs are sore.  Shoulder is throbbing.  No change in medication. Under a lot of stress with her brother.  Does not always sleep all night.  No recent illness.  Did get her flu shot, and then her booster.      No Known Allergies Previous Medications   ALPRAZOLAM (XANAX) 0.5 MG TABLET    at bedtime. Takes 1/2   AMPHETAMINE-DEXTROAMPHETAMINE (ADDERALL) 20 MG TABLET    Take 20 mg by mouth daily.   ASCORBIC ACID (VITAMIN C PO)    Take by mouth.   CHOLECALCIFEROL (VITAMIN D) 1000 UNITS TABLET    Take 1,000 Units by mouth daily.   CYANOCOBALAMIN (,VITAMIN B-12,) 1000 MCG/ML INJECTION    Inject 1,000 mcg into the muscle once a week.   DESONIDE (DESOWEN) 0.05 % OINTMENT    Apply topically as needed.   DOXEPIN (SINEQUAN) 10 MG CAPSULE    TAKE 1 CAPSULE BY MOUTH AT BEDTIME   ESTRADIOL (VAGIFEM) 10 MCG TABS VAGINAL TABLET    Place 1 tablet (10 mcg total) vaginally 2 (two) times a week.   LEVOTHYROXINE (SYNTHROID, LEVOTHROID) 75 MCG TABLET    TAKE 1 TABLET BY MOUTH DAILY   LOSARTAN (COZAAR) 50 MG TABLET    TAKE 1 TABLET BY MOUTH EVERY DAY   MULTIPLE VITAMINS-MINERALS (MULTIVITAMIN PO)    Take by mouth.   S-ADENOSYLMETHIONINE (SAM-E PO)    Take by mouth daily.   UNABLE TO FIND    daily. vitamins   VALACYCLOVIR (VALTREX) 500 MG  TABLET    One daily and increase to 2 x daily x 3 d at onset of outbreak    Review of Systems  Constitutional: Negative.   Cardiovascular: Negative.   Musculoskeletal: Positive for myalgias and joint swelling.  Neurological: Negative.     Social History  Substance Use Topics  . Smoking status: Former Research scientist (life sciences)  . Smokeless tobacco: Not on file  . Alcohol Use: No   Objective:   BP 134/72 mmHg  Pulse 76  Temp(Src) 97.6 F (36.4 C)  Resp 16  Ht 5\' 3"  (1.6 m)  Wt 143 lb (64.864 kg)  BMI 25.34 kg/m2  LMP 03/23/1994  Physical Exam  Constitutional: She is oriented to person, place, and time. She appears well-developed and well-nourished.  Cardiovascular: Normal rate and regular rhythm.   Pulmonary/Chest: Effort normal and breath sounds normal.  Musculoskeletal: Normal range of motion. She exhibits no edema.  Neurological: She is alert and oriented to person, place, and time.  Psychiatric: She has a normal mood and affect. Her behavior is normal. Judgment and thought content normal.      Assessment & Plan:  1. Arthritis New problem. Worsening.  Will check labs.  - Sedimentation rate - B. Burgdorfi Antibodies - Rheumatoid Factor - Cyclic citrul peptide antibody, IgG - CBC with Differential/Platelet  2. Hypothyroidism, unspecified hypothyroidism type Stable. Will check labs.   - TSH  3. Cramp of both lower extremities Will check labs.  - Magnesium    Margarita Rana, MD  [facility]

## 2015-01-31 LAB — CBC WITH DIFFERENTIAL/PLATELET
BASOS ABS: 0.1 10*3/uL (ref 0.0–0.2)
Basos: 1 %
EOS (ABSOLUTE): 0.1 10*3/uL (ref 0.0–0.4)
Eos: 2 %
Hematocrit: 36 % (ref 34.0–46.6)
Hemoglobin: 11.9 g/dL (ref 11.1–15.9)
Immature Grans (Abs): 0 10*3/uL (ref 0.0–0.1)
Immature Granulocytes: 0 %
LYMPHS ABS: 1.9 10*3/uL (ref 0.7–3.1)
Lymphs: 35 %
MCH: 30.1 pg (ref 26.6–33.0)
MCHC: 33.1 g/dL (ref 31.5–35.7)
MCV: 91 fL (ref 79–97)
MONOCYTES: 12 %
MONOS ABS: 0.7 10*3/uL (ref 0.1–0.9)
Neutrophils Absolute: 2.7 10*3/uL (ref 1.4–7.0)
Neutrophils: 50 %
Platelets: 300 10*3/uL (ref 150–379)
RBC: 3.95 x10E6/uL (ref 3.77–5.28)
RDW: 13.9 % (ref 12.3–15.4)
WBC: 5.4 10*3/uL (ref 3.4–10.8)

## 2015-01-31 LAB — RHEUMATOID FACTOR

## 2015-01-31 LAB — SEDIMENTATION RATE: SED RATE: 8 mm/h (ref 0–40)

## 2015-01-31 LAB — MAGNESIUM: MAGNESIUM: 2.5 mg/dL — AB (ref 1.6–2.3)

## 2015-01-31 LAB — B. BURGDORFI ANTIBODIES: Lyme IgG/IgM Ab: <0.91 {ISR} (ref 0.00–0.90)

## 2015-01-31 LAB — TSH: TSH: 3.23 u[IU]/mL (ref 0.450–4.500)

## 2015-02-01 ENCOUNTER — Telehealth: Payer: Self-pay

## 2015-02-01 NOTE — Telephone Encounter (Signed)
-----   Message from Margarita Rana, MD sent at 01/31/2015  2:58 PM EST ----- Labs normal except CCP was ordered to wrong lab. Please call lab and see if we can order from blood already drawn. Also, please notify patient of results so far. Thanks.

## 2015-02-01 NOTE — Telephone Encounter (Signed)
Spoke with Danae Chen at [facility] and ordered the [place]. Advised pt of lab results. Renaldo Fiddler, CMA

## 2015-02-12 ENCOUNTER — Encounter: Payer: Self-pay | Admitting: Family Medicine

## 2015-02-12 ENCOUNTER — Ambulatory Visit (INDEPENDENT_AMBULATORY_CARE_PROVIDER_SITE_OTHER): Payer: Federal, State, Local not specified - PPO | Admitting: Family Medicine

## 2015-02-12 VITALS — BP 124/70 | HR 71 | Temp 97.6°F | Resp 16 | Wt 144.8 lb

## 2015-02-12 DIAGNOSIS — K219 Gastro-esophageal reflux disease without esophagitis: Secondary | ICD-10-CM | POA: Insufficient documentation

## 2015-02-12 DIAGNOSIS — H9190 Unspecified hearing loss, unspecified ear: Secondary | ICD-10-CM | POA: Insufficient documentation

## 2015-02-12 DIAGNOSIS — R300 Dysuria: Secondary | ICD-10-CM

## 2015-02-12 DIAGNOSIS — E538 Deficiency of other specified B group vitamins: Secondary | ICD-10-CM | POA: Insufficient documentation

## 2015-02-12 DIAGNOSIS — R7309 Other abnormal glucose: Secondary | ICD-10-CM | POA: Insufficient documentation

## 2015-02-12 DIAGNOSIS — R252 Cramp and spasm: Secondary | ICD-10-CM | POA: Insufficient documentation

## 2015-02-12 DIAGNOSIS — K5900 Constipation, unspecified: Secondary | ICD-10-CM

## 2015-02-12 LAB — POCT URINALYSIS DIPSTICK
Bilirubin, UA: NEGATIVE
GLUCOSE UA: NEGATIVE
Ketones, UA: NEGATIVE
Leukocytes, UA: NEGATIVE
NITRITE UA: NEGATIVE
PH UA: 7
PROTEIN UA: NEGATIVE
Spec Grav, UA: 1.02
UROBILINOGEN UA: 0.2

## 2015-02-12 NOTE — Progress Notes (Signed)
Patient: Adriana Hicks Female    DOB: 1945-04-28   69 y.o.   MRN: [id number] Visit Date: 02/12/2015  Today's Provider: Margarita Rana, MD   Chief Complaint  Patient presents with  . Urinary Tract Infection   Subjective:    Urinary Tract Infection  Associated symptoms include urgency. Pertinent negatives include no chills or hematuria.  69 yo female here for possible UTI. Started Sunday with bilateral lower abdominal pain, intermittent. 4 to 5 out of 10. Also has fatigue and constipation.  Does have some frequency, urge, darker, no blood.  No back pain, no fever.  Several small stools. Also history of UTIs, and yeast infections.   No dysuria.  Has not taken Miralax for her stool.  Would definitely say she is still constipated.        No Known Allergies Previous Medications   AMPHETAMINE-DEXTROAMPHETAMINE (ADDERALL) 20 MG TABLET    Take 20 mg by mouth daily.   ASCORBIC ACID (VITAMIN C PO)    Take by mouth.   CHOLECALCIFEROL (VITAMIN D) 1000 UNITS TABLET    Take 1,000 Units by mouth daily.   CYANOCOBALAMIN (,VITAMIN B-12,) 1000 MCG/ML INJECTION    Inject 1,000 mcg into the muscle once a week.   DESONIDE (DESOWEN) 0.05 % OINTMENT    Apply topically as needed.   DOXEPIN (SINEQUAN) 10 MG CAPSULE    TAKE 1 CAPSULE BY MOUTH AT BEDTIME   ESTRADIOL (VAGIFEM) 10 MCG TABS VAGINAL TABLET    Place 1 tablet (10 mcg total) vaginally 2 (two) times a week.   FERROUS SULFATE 324 (65 FE) MG TBEC    Take by mouth.   LEVOTHYROXINE (SYNTHROID, LEVOTHROID) 75 MCG TABLET    TAKE 1 TABLET BY MOUTH DAILY   LOSARTAN (COZAAR) 50 MG TABLET    TAKE 1 TABLET BY MOUTH EVERY DAY   MULTIPLE VITAMINS-MINERALS (MULTIVITAMIN PO)    Take by mouth.   S-ADENOSYLMETHIONINE (SAM-E PO)    Take by mouth daily.   UNABLE TO FIND    daily. vitamins   VALACYCLOVIR (VALTREX) 500 MG TABLET    One daily and increase to 2 x daily x 3 d at onset of outbreak    Review of Systems  Constitutional: Positive for fatigue.  Negative for fever, chills, diaphoresis, activity change and appetite change.                                      Gastrointestinal: Positive for abdominal pain.  Genitourinary: Positive for urgency. Negative for hematuria, vaginal discharge and pelvic pain.    Social History  Substance Use Topics  . Smoking status: Former Research scientist (life sciences)  . Smokeless tobacco: Not on file  . Alcohol Use: No   Objective:   LMP 03/23/1994  Physical Exam  Constitutional: She is oriented to person, place, and time. She appears well-developed and well-nourished.  Cardiovascular: Normal rate and regular rhythm.   Pulmonary/Chest: Effort normal and breath sounds normal.  Abdominal: Soft. Bowel sounds are normal. There is no tenderness. There is no rebound.  Neurological: She is alert and oriented to person, place, and time.  BP 124/70 mmHg  Pulse 71  Temp(Src) 97.6 F (36.4 C) (Oral)  Resp 16  Wt 144 lb 12.8 oz (65.681 kg)  LMP 03/23/1994      Assessment & Plan:     1. Dysuria New problem. Dip normal. Will send  for culture to be sure not UTI.  - POCT urinalysis dipstick - Urinalysis, microscopic only - Urine Culture  2. Constipation, unspecified constipation type New problem Suspect cause of current symptoms. Restart Miralax daily, and increase fluid intake. Call if worsens or does not improve.  Margarita Rana, MD  [facility]

## 2015-02-13 DIAGNOSIS — K5900 Constipation, unspecified: Secondary | ICD-10-CM | POA: Insufficient documentation

## 2015-02-13 LAB — URINALYSIS, MICROSCOPIC ONLY
Bacteria, UA: NONE SEEN
Casts: NONE SEEN /LPF

## 2015-02-13 LAB — URINE CULTURE

## 2015-02-15 ENCOUNTER — Telehealth: Payer: Self-pay

## 2015-02-15 NOTE — Telephone Encounter (Signed)
Left message to call back  

## 2015-02-15 NOTE — Telephone Encounter (Signed)
-----   Message from Margarita Rana, MD sent at 02/14/2015  6:06 PM EST ----- No infection. Please see how patient is doing. Thanks.

## 2015-02-15 NOTE — Telephone Encounter (Signed)
Advised patient as below.  

## 2015-02-16 ENCOUNTER — Other Ambulatory Visit: Payer: Self-pay | Admitting: Family Medicine

## 2015-02-18 NOTE — Telephone Encounter (Signed)
Dr. Venia Minks  This came to Dr Darnell Level but I think she is your patient.  Thanks  ED

## 2015-02-19 ENCOUNTER — Telehealth: Payer: Self-pay

## 2015-02-19 ENCOUNTER — Telehealth: Payer: Self-pay | Admitting: Student

## 2015-02-19 LAB — CYCLIC CITRUL PEPTIDE ANTIBODY, IGG/IGA: Cyclic Citrullin Peptide Ab: 8 units (ref 0–19)

## 2015-02-19 LAB — SPECIMEN STATUS REPORT

## 2015-02-19 NOTE — Telephone Encounter (Signed)
Pt advised.   Thanks,   -Ofilia Rayon  

## 2015-02-19 NOTE — Telephone Encounter (Signed)
Called pt to f/u on lab results and UTI sx. Pt agrees urinary frequency, urgency is related to constipation; resolved with Miralax. Feeling better. She did not take the abx. She received the message about labs negative for infection. Romie Minus Rayquan Amrhein, MS3

## 2015-02-19 NOTE — Telephone Encounter (Signed)
-----   Message from Margarita Rana, MD sent at 02/19/2015 12:03 PM EST ----- Last arthritis test finally  Came back. Was negative. Thanks.

## 2015-02-21 ENCOUNTER — Ambulatory Visit: Payer: Federal, State, Local not specified - PPO | Admitting: Certified Nurse Midwife

## 2015-02-26 ENCOUNTER — Ambulatory Visit: Payer: Federal, State, Local not specified - PPO | Admitting: Certified Nurse Midwife

## 2015-02-26 ENCOUNTER — Encounter: Payer: Self-pay | Admitting: Certified Nurse Midwife

## 2015-04-01 ENCOUNTER — Other Ambulatory Visit: Payer: Self-pay | Admitting: Family Medicine

## 2015-04-01 DIAGNOSIS — E538 Deficiency of other specified B group vitamins: Secondary | ICD-10-CM

## 2015-04-05 ENCOUNTER — Other Ambulatory Visit: Payer: Self-pay

## 2015-04-05 DIAGNOSIS — Z1231 Encounter for screening mammogram for malignant neoplasm of breast: Secondary | ICD-10-CM

## 2015-05-10 ENCOUNTER — Other Ambulatory Visit: Payer: Self-pay

## 2015-05-10 DIAGNOSIS — G4700 Insomnia, unspecified: Secondary | ICD-10-CM

## 2015-05-10 MED ORDER — DOXEPIN HCL 10 MG PO CAPS: 10.0000 mg | ORAL_CAPSULE | Freq: Every day | ORAL | Status: DC

## 2015-05-10 NOTE — Telephone Encounter (Signed)
Pharmacy requesting refill.

## 2015-05-15 ENCOUNTER — Other Ambulatory Visit: Payer: Self-pay

## 2015-05-15 ENCOUNTER — Ambulatory Visit
Admission: RE | Admit: 2015-05-15 | Discharge: 2015-05-15 | Disposition: A | Discharge status: Home / Self Care | Payer: Federal, State, Local not specified - PPO | Source: Ambulatory Visit

## 2015-05-15 DIAGNOSIS — Z1231 Encounter for screening mammogram for malignant neoplasm of breast: Secondary | ICD-10-CM

## 2015-05-17 ENCOUNTER — Ambulatory Visit (INDEPENDENT_AMBULATORY_CARE_PROVIDER_SITE_OTHER): Payer: Federal, State, Local not specified - PPO | Admitting: Family Medicine

## 2015-05-17 ENCOUNTER — Encounter: Payer: Self-pay | Admitting: Family Medicine

## 2015-05-17 VITALS — BP 98/56 | HR 72 | Temp 98.0°F | Resp 16 | Ht 62.5 in | Wt 143.0 lb

## 2015-05-17 DIAGNOSIS — Z87891 Personal history of nicotine dependence: Secondary | ICD-10-CM | POA: Diagnosis not present

## 2015-05-17 DIAGNOSIS — E039 Hypothyroidism, unspecified: Secondary | ICD-10-CM | POA: Diagnosis not present

## 2015-05-17 DIAGNOSIS — I10 Essential (primary) hypertension: Secondary | ICD-10-CM

## 2015-05-17 DIAGNOSIS — Z0000 Encounter for general adult medical examination without abnormal findings: Secondary | ICD-10-CM | POA: Diagnosis not present

## 2015-05-17 DIAGNOSIS — Z1211 Encounter for screening for malignant neoplasm of colon: Secondary | ICD-10-CM

## 2015-05-17 DIAGNOSIS — E7800 Pure hypercholesterolemia, unspecified: Secondary | ICD-10-CM

## 2015-05-17 DIAGNOSIS — Z780 Asymptomatic menopausal state: Secondary | ICD-10-CM

## 2015-05-17 DIAGNOSIS — R7309 Other abnormal glucose: Secondary | ICD-10-CM | POA: Diagnosis not present

## 2015-05-17 MED ORDER — LOSARTAN POTASSIUM 25 MG PO TABS: 25.0000 mg | ORAL_TABLET | Freq: Every day | ORAL | Status: DC

## 2015-05-17 NOTE — Progress Notes (Signed)
Patient ID: Adriana Hicks, female   DOB: 1945/10/16, 70 y.o.   MRN: [id number]       Patient: Adriana Hicks, Female    DOB: July 04, 1945, 70 y.o.   MRN: [id number] Visit Date: 05/17/2015  Today's Provider: Margarita Rana, MD   Chief Complaint  Patient presents with  . Annual Exam   Subjective:    Annual physical exam Adriana Hicks is a 70 y.o. female who presents today for health maintenance and complete physical. She feels well. She reports exercising 2 times a week. She reports she is sleeping well.  01/08/14 CPE 08/06/14  Pap-normal at [place] 05/15/15 Mammo-BI-RADS 1 01/06/13 BMD-osteopenia 06/04/05 Colonoscopy-internal hemorrhoids, recheck in 10 yrs  -----------------------------------------------------------------   Review of Systems  Constitutional: Negative.   HENT: Positive for postnasal drip.   Eyes: Negative.   Respiratory: Negative.   Cardiovascular: Negative.   Gastrointestinal: Negative.   Endocrine: Negative.   Genitourinary: Negative.   Musculoskeletal: Negative.   Skin: Negative.   Allergic/Immunologic: Negative.   Neurological: Negative.   Hematological: Negative.   Psychiatric/Behavioral: Negative.     Social History      She  reports that she quit smoking about 11 years ago. She has never used smokeless tobacco. She reports that she does not drink alcohol or use illicit drugs.       Social History   Social History  . Marital Status: Married    Spouse Name: N/A  . Number of Children: N/A  . Years of Education: N/A   Social History Main Topics  . Smoking status: Former Smoker    Quit date: 03/22/2004  . Smokeless tobacco: Never Used  . Alcohol Use: No  . Drug Use: No  . Sexual Activity:    Partners: Male    Birth Control/ Protection: Surgical     Comment: btl   Other Topics Concern  . None   Social History Narrative    Past Medical History  Diagnosis Date  . HSV-2 (herpes simplex virus 2) infection   .  Hyperlipidemia   . Hypertension   . Breast cancer ([place])     Left; Age 89  . S/P appendectomy 1981  . Anemia   . Abnormal Pap smear of cervix 3/15    ASCUS HPV not detected  . Genital herpes 03/26/2009     Patient Active Problem List   Diagnosis Date Noted  . Constipation 02/13/2015  . Cramp in lower leg 02/12/2015  . Difficulty hearing 02/12/2015  . Acid reflux 02/12/2015  . B12 deficiency 02/12/2015  . Abnormal blood sugar 02/12/2015  . Hypothyroidism 09/10/2014  . Insomnia 09/05/2014  . Other specified postprocedural states 04/18/2013  . Personal history of malignant neoplasm of breast 06/20/2012  . History of abnormal Pap smear 06/20/2012  . Benign hypertension 07/01/2009  . Genital herpes 03/26/2009  . 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 Hypercholesterolemia without hypertriglyceridemia 02/21/2009  . Allergic rhinitis 02/21/2009    Past Surgical History  Procedure Laterality Date  . Tonsillectomy  child  . Tubal ligation    . Cesarean section  1985  . Mastectomy Left     new implant on the Right  . Augmentation mammaplasty  1982  . Breast surgery  2005    Breast Implant replaceddue to leaking  . Breast surgery Left 2006    due to asymmetry  . Cervical biopsy  w/ loop electrode excision      unsure pathology   .  Appendectomy      Family History        Family Status  Relation Status Death Age  . Mother Alive   . Father Deceased   . Brother Alive         Her family history includes Breast cancer (age of onset: 28) in her mother; Crohn's disease in her brother; Heart disease in her father; Skin cancer in her brother.    Allergies  Allergen Reactions  . Sulfamethoxazole Other (See Comments)    Vaginal itching     Previous Medications   AMPHETAMINE-DEXTROAMPHETAMINE (ADDERALL) 20 MG TABLET    Take 20 mg by mouth daily.   ASCORBIC ACID (VITAMIN C PO)    Take by mouth.   CHOLECALCIFEROL (VITAMIN D) 1000 UNITS  TABLET    Take 1,000 Units by mouth daily.   CYANOCOBALAMIN (,VITAMIN B-12,) 1000 MCG/ML INJECTION    USE 1 ML PER INJECTION, IM WEEKLY   DOXEPIN (SINEQUAN) 10 MG CAPSULE    Take 1 capsule (10 mg total) by mouth at bedtime.   ESTRADIOL (VAGIFEM) 10 MCG TABS VAGINAL TABLET    Place 1 tablet (10 mcg total) vaginally 2 (two) times a week.   FERROUS SULFATE 324 (65 FE) MG TBEC    Take by mouth as needed.    LEVOTHYROXINE (SYNTHROID, LEVOTHROID) 75 MCG TABLET    TAKE 1 TABLET BY MOUTH DAILY   LOSARTAN (COZAAR) 50 MG TABLET    TAKE 1 TABLET BY MOUTH EVERY DAY   MULTIPLE VITAMINS-MINERALS (MULTIVITAMIN PO)    Take by mouth.   S-ADENOSYLMETHIONINE (SAM-E PO)    Take by mouth daily.   VALACYCLOVIR (VALTREX) 500 MG TABLET    One daily and increase to 2 x daily x 3 d at onset of outbreak    Patient Care Team: Margarita Rana, MD as PCP - General (Family Medicine)     Objective:   Vitals: BP 98/56 mmHg  Pulse 72  Temp(Src) 98 F (36.7 C) (Oral)  Resp 16  Ht 5' 2.5" (1.588 m)  Wt 143 lb (64.864 kg)  BMI 25.72 kg/m2  SpO2 98%  LMP 03/23/1994   Physical Exam  Constitutional: She is oriented to person, place, and time. She appears well-developed and well-nourished.  HENT:  Head: Normocephalic and atraumatic.  Right Ear: Tympanic membrane, external ear and ear canal normal.  Left Ear: Tympanic membrane, external ear and ear canal normal.  Nose: Nose normal.  Mouth/Throat: Uvula is midline, oropharynx is clear and moist and mucous membranes are normal.  Eyes: Conjunctivae, EOM and lids are normal. Pupils are equal, round, and reactive to light.  Neck: Trachea normal and normal range of motion. Neck supple. Carotid bruit is not present. No thyroid mass and no thyromegaly present.  Cardiovascular: Normal rate, regular rhythm and normal heart sounds.   Pulmonary/Chest: Effort normal and breath sounds normal.  Abdominal: Soft. Normal appearance and bowel sounds are normal. There is no  hepatosplenomegaly. There is no tenderness.  Musculoskeletal: Normal range of motion.  Lymphadenopathy:    She has no cervical adenopathy.    She has no axillary adenopathy.  Neurological: She is alert and oriented to person, place, and time. She has normal strength. No cranial nerve deficit.  Skin: Skin is warm, dry and intact.  Psychiatric: She has a normal mood and affect. Her speech is normal and behavior is normal. Judgment and thought content normal. Cognition and memory are normal.     Depression Screen PHQ 2/9 Scores 05/17/2015  PHQ - 2 Score 0      Assessment & Plan:     Routine Health Maintenance and Physical Exam  Exercise Activities and Dietary recommendations Goals    None      Immunization History  Administered Date(s) Administered  . Influenza, High Dose Seasonal PF 01/03/2015  . Pneumococcal Conjugate-13 01/03/2015  . Pneumococcal-Unspecified 03/23/2013  . Td 03/23/2009  . Zoster 04/23/2013       1. Annual physical exam Stable. Patient advised to continue eating healthy and exercise daily. Patient will continue to have pap and mammogram done by GYN.  2. Benign hypertension Blood pressure low today. Patient advised to start losartan 25 mg daily as below. Patient advised to discontinue losartan 50 mg. - CBC with Differential/Platelet - Comprehensive metabolic panel - losartan (COZAAR) 25 MG tablet; Take 1 tablet (25 mg total) by mouth daily.  Dispense: 30 tablet; Refill: 5  3. Hypothyroidism, unspecified hypothyroidism type - TSH  4. Abnormal blood sugar - Hemoglobin A1c  5. Colon cancer screening - Ambulatory referral to Gastroenterology  6. Postmenopausal - DG Bone Density; Future  7. History of smoking 30 or more pack years - CT CHEST LUNG CA SCREEN LOW DOSE W/O CM; Future  8. Hypercholesterolemia without hypertriglyceridemia - Lipid Panel With LDL/HDL Ratio     Patient seen and examined by Dr. Jerrell Belfast, and note scribed  by Philbert Riser. Dimas, CMA.  I have reviewed the document for accuracy and completeness and I agree with above. Jerrell Belfast, MD   Margarita Rana, MD   --------------------------------------------------------------------

## 2015-05-19 ENCOUNTER — Other Ambulatory Visit: Payer: Self-pay | Admitting: Family Medicine

## 2015-05-19 DIAGNOSIS — E538 Deficiency of other specified B group vitamins: Secondary | ICD-10-CM

## 2015-05-20 ENCOUNTER — Other Ambulatory Visit: Payer: Self-pay | Admitting: Family Medicine

## 2015-05-20 DIAGNOSIS — E538 Deficiency of other specified B group vitamins: Secondary | ICD-10-CM

## 2015-05-22 LAB — COMPREHENSIVE METABOLIC PANEL
A/G RATIO: 1.7 (ref 1.1–2.5)
ALK PHOS: 63 IU/L (ref 39–117)
ALT: 25 IU/L (ref 0–32)
AST: 34 IU/L (ref 0–40)
Albumin: 4.5 g/dL (ref 3.6–4.8)
BUN/Creatinine Ratio: 27 — ABNORMAL HIGH (ref 11–26)
BUN: 18 mg/dL (ref 8–27)
Bilirubin Total: 0.3 mg/dL (ref 0.0–1.2)
CALCIUM: 9.4 mg/dL (ref 8.7–10.3)
CO2: 19 mmol/L (ref 18–29)
Chloride: 101 mmol/L (ref 96–106)
Creatinine, Ser: 0.67 mg/dL (ref 0.57–1.00)
GFR calc Af Amer: 105 mL/min/{1.73_m2} (ref >59)
GFR, EST NON AFRICAN AMERICAN: 91 mL/min/{1.73_m2} (ref >59)
GLOBULIN, TOTAL: 2.6 g/dL (ref 1.5–4.5)
Glucose: 89 mg/dL (ref 65–99)
POTASSIUM: 4.6 mmol/L (ref 3.5–5.2)
SODIUM: 139 mmol/L (ref 134–144)
Total Protein: 7.1 g/dL (ref 6.0–8.5)

## 2015-05-22 LAB — CBC WITH DIFFERENTIAL/PLATELET
BASOS ABS: 0.1 10*3/uL (ref 0.0–0.2)
Basos: 1 %
EOS (ABSOLUTE): 0.1 10*3/uL (ref 0.0–0.4)
Eos: 2 %
HEMATOCRIT: 35 % (ref 34.0–46.6)
Hemoglobin: 12.1 g/dL (ref 11.1–15.9)
Immature Grans (Abs): 0 10*3/uL (ref 0.0–0.1)
Immature Granulocytes: 0 %
LYMPHS ABS: 2 10*3/uL (ref 0.7–3.1)
Lymphs: 39 %
MCH: 30.4 pg (ref 26.6–33.0)
MCHC: 34.6 g/dL (ref 31.5–35.7)
MCV: 88 fL (ref 79–97)
MONOCYTES: 7 %
Monocytes Absolute: 0.4 10*3/uL (ref 0.1–0.9)
NEUTROS ABS: 2.6 10*3/uL (ref 1.4–7.0)
Neutrophils: 51 %
Platelets: 231 10*3/uL (ref 150–379)
RBC: 3.98 x10E6/uL (ref 3.77–5.28)
RDW: 13.6 % (ref 12.3–15.4)
WBC: 5.1 10*3/uL (ref 3.4–10.8)

## 2015-05-22 LAB — HEMOGLOBIN A1C
Est. average glucose Bld gHb Est-mCnc: 114 mg/dL
Hgb A1c MFr Bld: 5.6 % (ref 4.8–5.6)

## 2015-05-22 LAB — LIPID PANEL WITH LDL/HDL RATIO
Cholesterol, Total: 231 mg/dL — ABNORMAL HIGH (ref 100–199)
HDL: 86 mg/dL (ref >39)
LDL Calculated: 131 mg/dL — ABNORMAL HIGH (ref 0–99)
LDL/HDL RATIO: 1.5 ratio (ref 0.0–3.2)
TRIGLYCERIDES: 70 mg/dL (ref 0–149)
VLDL CHOLESTEROL CAL: 14 mg/dL (ref 5–40)

## 2015-05-22 LAB — TSH: TSH: 3.26 u[IU]/mL (ref 0.450–4.500)

## 2015-05-24 ENCOUNTER — Telehealth: Payer: Self-pay

## 2015-05-24 ENCOUNTER — Other Ambulatory Visit: Payer: Self-pay

## 2015-05-24 NOTE — Telephone Encounter (Signed)
LMTCB 05/24/2015  Thanks,   -Mickel Baas

## 2015-05-24 NOTE — Telephone Encounter (Signed)
-----   Message from Margarita Rana, MD sent at 05/24/2015  6:38 AM EST ----- Labs stable. Please notify patient.  Cholesterol is elevated, but has high good cholesterol. Thanks.

## 2015-05-24 NOTE — Telephone Encounter (Signed)
Gastroenterology Pre-Procedure Review  Request Date: 06/17/15 Requesting Physician: Dr. Venia Minks  PATIENT REVIEW QUESTIONS: The patient responded to the following health history questions as indicated:    1. Are you having any GI issues? no 2. Do you have a personal history of Polyps? no 3. Do you have a family history of Colon Cancer or Polyps? no 4. Diabetes Mellitus? no 5. Joint replacements in the past 12 months?no 6. Major health problems in the past 3 months?no 7. Any artificial heart valves, MVP, or defibrillator?no    MEDICATIONS & ALLERGIES:    Patient reports the following regarding taking any anticoagulation/antiplatelet therapy:   Plavix, Coumadin, Eliquis, Xarelto, Lovenox, Pradaxa, Brilinta, or Effient? no Aspirin? no  Patient confirms/reports the following medications:  Current Outpatient Prescriptions  Medication Sig Dispense Refill  . amphetamine-dextroamphetamine (ADDERALL) 20 MG tablet Take 20 mg by mouth daily.    . Ascorbic Acid (VITAMIN C PO) Take by mouth.    . cholecalciferol (VITAMIN D) 1000 UNITS tablet Take 1,000 Units by mouth daily.    . cyanocobalamin (,VITAMIN B-12,) 1000 MCG/ML injection USE 1 ML PER INJECTION, IM WEEKLY 10 mL 5  . doxepin (SINEQUAN) 10 MG capsule Take 1 capsule (10 mg total) by mouth at bedtime. 30 capsule 4  . Estradiol (VAGIFEM) 10 MCG TABS vaginal tablet Place 1 tablet (10 mcg total) vaginally 2 (two) times a week. 24 tablet 4  . ferrous sulfate 324 (65 FE) MG TBEC Take by mouth as needed.     Marland Kitchen levothyroxine (SYNTHROID, LEVOTHROID) 75 MCG tablet TAKE 1 TABLET BY MOUTH DAILY 30 tablet 12  . losartan (COZAAR) 25 MG tablet Take 1 tablet (25 mg total) by mouth daily. 30 tablet 5  . Multiple Vitamins-Minerals (MULTIVITAMIN PO) Take by mouth.    . S-Adenosylmethionine (SAM-E PO) Take by mouth daily.    . valACYclovir (VALTREX) 500 MG tablet One daily and increase to 2 x daily x 3 d at onset of outbreak 45 tablet 12   No current  facility-administered medications for this visit.    Patient confirms/reports the following allergies:  Allergies  Allergen Reactions  . Sulfamethoxazole Other (See Comments)    Vaginal itching     No orders of the defined types were placed in this encounter.    AUTHORIZATION INFORMATION Primary Insurance: 1D#: Group #:  Secondary Insurance: 1D#: Group #:  SCHEDULE INFORMATION: Date: 06/17/15 Time: Location: [place]

## 2015-05-27 ENCOUNTER — Other Ambulatory Visit: Payer: Self-pay | Admitting: Family Medicine

## 2015-05-27 DIAGNOSIS — E538 Deficiency of other specified B group vitamins: Secondary | ICD-10-CM

## 2015-05-28 NOTE — Telephone Encounter (Signed)
Please see if this has been done.  Thanks.

## 2015-05-28 NOTE — Telephone Encounter (Signed)
Pt advised of lab work.  She was also checking to see if her Chest CT was scheduled yet.  Thanks,   -Mickel Baas

## 2015-05-28 NOTE — Telephone Encounter (Signed)
[facility].He will contact pt with appointment

## 2015-05-28 NOTE — Telephone Encounter (Signed)
LMTCB 05/28/2015  Thanks,   -Mickel Baas

## 2015-06-06 ENCOUNTER — Ambulatory Visit
Admission: RE | Admit: 2015-06-06 | Discharge: 2015-06-06 | Disposition: A | Discharge status: Home / Self Care | Payer: Federal, State, Local not specified - PPO | Source: Ambulatory Visit | Attending: Family Medicine | Admitting: Family Medicine

## 2015-06-06 DIAGNOSIS — Z780 Asymptomatic menopausal state: Secondary | ICD-10-CM | POA: Diagnosis not present

## 2015-06-06 DIAGNOSIS — M85851 Other specified disorders of bone density and structure, right thigh: Secondary | ICD-10-CM | POA: Insufficient documentation

## 2015-06-06 DIAGNOSIS — M8588 Other specified disorders of bone density and structure, other site: Secondary | ICD-10-CM | POA: Diagnosis not present

## 2015-06-10 ENCOUNTER — Encounter: Payer: Self-pay | Admitting: *Deleted

## 2015-06-14 NOTE — Discharge Instructions (Signed)

## 2015-06-17 ENCOUNTER — Encounter
Admission: RE | Disposition: A | Discharge status: Home / Self Care | Payer: Self-pay | Source: Ambulatory Visit | Attending: Gastroenterology

## 2015-06-17 ENCOUNTER — Ambulatory Visit: Payer: Federal, State, Local not specified - PPO | Admitting: Anesthesiology

## 2015-06-17 ENCOUNTER — Ambulatory Visit
Admission: RE | Admit: 2015-06-17 | Discharge: 2015-06-17 | Disposition: A | Discharge status: Home / Self Care | Payer: Federal, State, Local not specified - PPO | Source: Ambulatory Visit | Attending: Gastroenterology | Admitting: Gastroenterology

## 2015-06-17 ENCOUNTER — Telehealth: Payer: Self-pay

## 2015-06-17 DIAGNOSIS — Z882 Allergy status to sulfonamides status: Secondary | ICD-10-CM | POA: Diagnosis not present

## 2015-06-17 DIAGNOSIS — Z8379 Family history of other diseases of the digestive system: Secondary | ICD-10-CM | POA: Insufficient documentation

## 2015-06-17 DIAGNOSIS — Z79899 Other long term (current) drug therapy: Secondary | ICD-10-CM | POA: Diagnosis not present

## 2015-06-17 DIAGNOSIS — F988 Other specified behavioral and emotional disorders with onset usually occurring in childhood and adolescence: Secondary | ICD-10-CM | POA: Insufficient documentation

## 2015-06-17 DIAGNOSIS — Z808 Family history of malignant neoplasm of other organs or systems: Secondary | ICD-10-CM | POA: Insufficient documentation

## 2015-06-17 DIAGNOSIS — K641 Second degree hemorrhoids: Secondary | ICD-10-CM | POA: Insufficient documentation

## 2015-06-17 DIAGNOSIS — K219 Gastro-esophageal reflux disease without esophagitis: Secondary | ICD-10-CM | POA: Diagnosis not present

## 2015-06-17 DIAGNOSIS — E039 Hypothyroidism, unspecified: Secondary | ICD-10-CM | POA: Diagnosis not present

## 2015-06-17 DIAGNOSIS — Z9012 Acquired absence of left breast and nipple: Secondary | ICD-10-CM | POA: Insufficient documentation

## 2015-06-17 DIAGNOSIS — Z8249 Family history of ischemic heart disease and other diseases of the circulatory system: Secondary | ICD-10-CM | POA: Insufficient documentation

## 2015-06-17 DIAGNOSIS — Z1211 Encounter for screening for malignant neoplasm of colon: Secondary | ICD-10-CM | POA: Insufficient documentation

## 2015-06-17 DIAGNOSIS — Z853 Personal history of malignant neoplasm of breast: Secondary | ICD-10-CM | POA: Insufficient documentation

## 2015-06-17 DIAGNOSIS — M1990 Unspecified osteoarthritis, unspecified site: Secondary | ICD-10-CM | POA: Insufficient documentation

## 2015-06-17 DIAGNOSIS — Z9010 Acquired absence of unspecified breast and nipple: Secondary | ICD-10-CM | POA: Insufficient documentation

## 2015-06-17 DIAGNOSIS — E785 Hyperlipidemia, unspecified: Secondary | ICD-10-CM | POA: Insufficient documentation

## 2015-06-17 DIAGNOSIS — I10 Essential (primary) hypertension: Secondary | ICD-10-CM | POA: Insufficient documentation

## 2015-06-17 DIAGNOSIS — Z803 Family history of malignant neoplasm of breast: Secondary | ICD-10-CM | POA: Diagnosis not present

## 2015-06-17 DIAGNOSIS — Z87891 Personal history of nicotine dependence: Secondary | ICD-10-CM | POA: Diagnosis not present

## 2015-06-17 DIAGNOSIS — K621 Rectal polyp: Secondary | ICD-10-CM | POA: Diagnosis not present

## 2015-06-17 HISTORY — DX: Unspecified osteoarthritis, unspecified site: M19.90

## 2015-06-17 HISTORY — DX: Hypothyroidism, unspecified: E03.9

## 2015-06-17 HISTORY — DX: Gastro-esophageal reflux disease without esophagitis: K21.9

## 2015-06-17 HISTORY — DX: Other specified behavioral and emotional disorders with onset usually occurring in childhood and adolescence: F98.8

## 2015-06-17 HISTORY — PX: POLYPECTOMY: SHX5525

## 2015-06-17 HISTORY — DX: Dizziness and giddiness: R42

## 2015-06-17 HISTORY — PX: COLONOSCOPY WITH PROPOFOL: SHX5780

## 2015-06-17 SURGERY — COLONOSCOPY WITH PROPOFOL
Anesthesia: Monitor Anesthesia Care

## 2015-06-17 MED ORDER — STERILE WATER FOR IRRIGATION IR SOLN
Status: DC | PRN
  Administered 2015-06-17: 09:00:00

## 2015-06-17 MED ORDER — SODIUM CHLORIDE 0.9 % IV SOLN: INTRAVENOUS | Status: DC

## 2015-06-17 MED ORDER — LIDOCAINE HCL (CARDIAC) 20 MG/ML IV SOLN
INTRAVENOUS | Status: DC | PRN
  Administered 2015-06-17: 50 mg via INTRAVENOUS

## 2015-06-17 MED ORDER — LACTATED RINGERS IV SOLN
INTRAVENOUS | Status: DC
  Administered 2015-06-17: 08:00:00 via INTRAVENOUS

## 2015-06-17 MED ORDER — PROPOFOL 10 MG/ML IV BOLUS
INTRAVENOUS | Status: DC | PRN
  Administered 2015-06-17: 50 mg via INTRAVENOUS
  Administered 2015-06-17: 150 mg via INTRAVENOUS
  Administered 2015-06-17: 30 mg via INTRAVENOUS
  Administered 2015-06-17: 20 mg via INTRAVENOUS
  Administered 2015-06-17 (×3): 50 mg via INTRAVENOUS

## 2015-06-17 SURGICAL SUPPLY — 21 items
CANISTER SUCT 1200ML W/VALVE (MISCELLANEOUS) ×3 IMPLANT
CLIP HMST 235XBRD CATH ROT (MISCELLANEOUS) IMPLANT
CLIP RESOLUTION 360 11X235 (MISCELLANEOUS)
FCP ESCP3.2XJMB 240X2.8X (MISCELLANEOUS)
FORCEPS BIOP RAD 4 LRG CAP 4 (CUTTING FORCEPS) ×1 IMPLANT
FORCEPS BIOP RJ4 240 W/NDL (MISCELLANEOUS)
FORCEPS ESCP3.2XJMB 240X2.8X (MISCELLANEOUS) IMPLANT
GOWN CVR UNV OPN BCK APRN NK (MISCELLANEOUS) ×4 IMPLANT
GOWN ISOL THUMB LOOP REG UNIV (MISCELLANEOUS) ×6
INJECTOR VARIJECT VIN23 (MISCELLANEOUS) IMPLANT
KIT DEFENDO VALVE AND CONN (KITS) IMPLANT
KIT ENDO PROCEDURE OLY (KITS) ×3 IMPLANT
MARKER SPOT ENDO TATTOO 5ML (MISCELLANEOUS) IMPLANT
PAD GROUND ADULT SPLIT (MISCELLANEOUS) IMPLANT
PROBE APC STR FIRE (PROBE) ×3 IMPLANT
SNARE SHORT THROW 13M SML OVAL (MISCELLANEOUS) IMPLANT
SNARE SHORT THROW 30M LRG OVAL (MISCELLANEOUS) IMPLANT
SPOT EX ENDOSCOPIC TATTOO (MISCELLANEOUS)
VARIJECT INJECTOR VIN23 (MISCELLANEOUS)
WATER STERILE IRR 250ML POUR (IV SOLUTION) ×3 IMPLANT
WIDE-EYE POLYPTRAP (MISCELLANEOUS) IMPLANT

## 2015-06-17 NOTE — Transfer of Care (Signed)
Immediate Anesthesia Transfer of Care Note  Patient: Adriana Hicks  Procedure(s) Performed: Procedure(s) with comments: COLONOSCOPY WITH PROPOFOL (N/A) - PT PREFERS EARLY AM POLYPECTOMY  Patient Location: PACU  Anesthesia Type: MAC  Level of Consciousness: awake, alert  and patient cooperative  Airway and Oxygen Therapy: Patient Spontanous Breathing and Patient connected to supplemental oxygen  Post-op Assessment: Post-op Vital signs reviewed, Patient's Cardiovascular Status Stable, Respiratory Function Stable, Patent Airway and No signs of Nausea or vomiting  Post-op Vital Signs: Reviewed and stable  Complications: No apparent anesthesia complications

## 2015-06-17 NOTE — Telephone Encounter (Signed)
Pt advised.   Thanks,   -Aundra Pung  

## 2015-06-17 NOTE — Op Note (Signed)
[facility] Patient Name: Adriana Hicks Procedure Date: 06/17/2015 8:26 AM MRN: [id number] Account #: [id number] Date of Birth: [birth year] Admit Type: Outpatient Age: 70 Room: [facility] OR ROOM 01 Gender: Female Note Status: Finalized Procedure:            Colonoscopy Indications:          Screening for colorectal malignant neoplasm Providers:            Lucilla Lame, MD Referring MD:         Jerrell Belfast, MD (Referring MD) Medicines:            Propofol per Anesthesia Complications:        No immediate complications. Procedure:            Pre-Anesthesia Assessment:                       - Prior to the procedure, a History and Physical was                        performed, and patient medications and allergies were                        reviewed. The patient's tolerance of previous                        anesthesia was also reviewed. The risks and benefits of                        the procedure and the sedation options and risks were                        discussed with the patient. All questions were                        answered, and informed consent was obtained. Prior                        Anticoagulants: The patient has taken no previous                        anticoagulant or antiplatelet agents. ASA Grade                        Assessment: II - A patient with mild systemic disease.                        After reviewing the risks and benefits, the patient was                        deemed in satisfactory condition to undergo the                        procedure.                       After obtaining informed consent, the colonoscope was                        passed under direct vision. Throughout the procedure,  the patient's blood pressure, pulse, and oxygen                        saturations were monitored continuously. The Olympus CF                        H180AL colonoscope (S#: [id number]) was introduced through                         the anus and advanced to the the cecum, identified by                        appendiceal orifice and ileocecal valve. The                        colonoscopy was performed without difficulty. The                        patient tolerated the procedure well. The quality of                        the bowel preparation was excellent. Findings:      The perianal and digital rectal examinations were normal.      A 3 mm polyp was found in the rectum. The polyp was sessile. The polyp       was removed with a cold biopsy forceps. Resection and retrieval were       complete.      Non-bleeding internal hemorrhoids were found during retroflexion. The       hemorrhoids were Grade II (internal hemorrhoids that prolapse but reduce       spontaneously). Impression:           - One 3 mm polyp in the rectum, removed with a cold                        biopsy forceps. Resected and retrieved.                       - Non-bleeding internal hemorrhoids. Recommendation:       - Await pathology results.                       - Repeat colonoscopy in 5 years if polyp adenoma and 10                        years if hyperplastic Procedure Code(s):    --- Professional ---                       [phone], Colonoscopy, flexible; with biopsy, single or                        multiple Diagnosis Code(s):    --- Professional ---                       Z12.11, Encounter for screening for malignant neoplasm                        of colon  K62.1, Rectal polyp CPT copyright 2016 American Medical Association. All rights reserved. The codes documented in this report are preliminary and upon coder review may  be revised to meet current compliance requirements. Lucilla Lame, MD 06/17/2015 9:02:34 AM This report has been signed electronically. Number of Addenda: 0 Note Initiated On: 06/17/2015 8:26 AM Scope Withdrawal Time: 0 hours 6 minutes 56 seconds  Total Procedure Duration: 0 hours 19  minutes 24 seconds       [facility]

## 2015-06-17 NOTE — Anesthesia Postprocedure Evaluation (Signed)
Anesthesia Post Note  Patient: Adriana Hicks  Procedure(s) Performed: Procedure(s) (LRB): COLONOSCOPY WITH PROPOFOL (N/A) POLYPECTOMY  Patient location during evaluation: PACU Anesthesia Type: MAC Level of consciousness: awake and alert and oriented Pain management: satisfactory to patient Vital Signs Assessment: post-procedure vital signs reviewed and stable Respiratory status: spontaneous breathing, nonlabored ventilation and respiratory function stable Cardiovascular status: blood pressure returned to baseline and stable Postop Assessment: Adequate PO intake and No signs of nausea or vomiting Anesthetic complications: no    Raliegh Ip

## 2015-06-17 NOTE — H&P (Signed)
[facility]  [address], [place], [place] [address] Phone: [phone] Fax : [phone]  Primary Care Physician:  Margarita Rana, MD Primary Gastroenterologist:  Dr. Allen Norris  Pre-Procedure History & Physical: HPI:  Adriana Hicks is a 70 y.o. female is here for a screening colonoscopy.   Past Medical History  Diagnosis Date  . HSV-2 (herpes simplex virus 2) infection   . Hyperlipidemia   . Hypertension   . Breast cancer ([place])     Left; Age 57  . S/P appendectomy 1981  . Anemia   . Abnormal Pap smear of cervix 3/15    ASCUS HPV not detected  . Genital herpes 03/26/2009  . Hypothyroidism   . GERD (gastroesophageal reflux disease)   . ADD (attention deficit disorder)   . Arthritis     fingers  . Vertigo     x1 - approx 5 yrs ago    Past Surgical History  Procedure Laterality Date  . Tonsillectomy  child  . Tubal ligation    . Cesarean section  1985  . Mastectomy Left     new implant on the Right  . Augmentation mammaplasty  1982  . Breast surgery  2005    Breast Implant replaceddue to leaking  . Breast surgery Left 2006    due to asymmetry  . Cervical biopsy  w/ loop electrode excision      unsure pathology   . Appendectomy      Prior to Admission medications   Medication Sig Start Date End Date Taking? Authorizing Provider  amphetamine-dextroamphetamine (ADDERALL) 20 MG tablet Take 20 mg by mouth daily.   Yes Historical Provider, MD  Ascorbic Acid (VITAMIN C PO) Take by mouth.   Yes Historical Provider, MD  cholecalciferol (VITAMIN D) 1000 UNITS tablet Take 1,000 Units by mouth daily.   Yes Historical Provider, MD  cyanocobalamin (,VITAMIN B-12,) 1000 MCG/ML injection USE 1 ML PER INJECTION, IM WEEKLY 05/28/15  Yes Margarita Rana, MD  doxepin (SINEQUAN) 10 MG capsule Take 1 capsule (10 mg total) by mouth at bedtime. 05/10/15  Yes Margarita Rana, MD  Estradiol (VAGIFEM) 10 MCG TABS vaginal tablet Place 1 tablet (10 mcg total) vaginally 2 (two)  times a week. 08/06/14  Yes Regina Eck, CNM  levothyroxine (SYNTHROID, LEVOTHROID) 75 MCG tablet TAKE 1 TABLET BY MOUTH DAILY 11/06/14  Yes Birdie Sons, MD  losartan (COZAAR) 25 MG tablet Take 1 tablet (25 mg total) by mouth daily. 05/17/15  Yes Margarita Rana, MD  Multiple Vitamins-Minerals (MULTIVITAMIN PO) Take by mouth.   Yes Historical Provider, MD  S-Adenosylmethionine (SAM-E PO) Take by mouth daily.   Yes Historical Provider, MD  valACYclovir (VALTREX) 500 MG tablet One daily and increase to 2 x daily x 3 d at onset of outbreak 08/06/14  Yes Regina Eck, CNM  ferrous sulfate 324 (65 FE) MG TBEC Take by mouth as needed. Reported on 06/17/2015    Historical Provider, MD    Allergies as of 05/24/2015 - Review Complete 05/24/2015  Allergen Reaction Noted  . Sulfamethoxazole Other (See Comments) 05/17/2015    Family History  Problem Relation Age of Onset  . Breast cancer Mother 24  . Heart disease Father   . Skin cancer Brother   . Crohn's disease Brother     Social History   Social History  . Marital Status: Married    Spouse Name: N/A  . Number of Children: N/A  . Years of Education: N/A  Occupational History  . Not on file.   Social History Main Topics  . Smoking status: Former Smoker    Quit date: 03/22/2004  . Smokeless tobacco: Never Used  . Alcohol Use: 1.2 oz/week    2 Glasses of wine per week  . Drug Use: No  . Sexual Activity:    Partners: Male    Birth Control/ Protection: Surgical     Comment: btl   Other Topics Concern  . Not on file   Social History Narrative    Review of Systems: See HPI, otherwise negative ROS  Physical Exam: BP 161/76 mmHg  Pulse 63  Temp(Src) 98.2 F (36.8 C) (Temporal)  Resp 16  Ht 5\' 3"  (1.6 m)  Wt 141 lb (63.957 kg)  BMI 24.98 kg/m2  SpO2 99%  LMP 03/23/1994 General:   Alert,  pleasant and cooperative in NAD Head:  Normocephalic and atraumatic. Neck:  Supple; no masses or thyromegaly. Lungs:  Clear  throughout to auscultation.    Heart:  Regular rate and rhythm. Abdomen:  Soft, nontender and nondistended. Normal bowel sounds, without guarding, and without rebound.   Neurologic:  Alert and  oriented x4;  grossly normal neurologically.  Impression/Plan: [name] is now here to undergo a screening colonoscopy.  Risks, benefits, and alternatives regarding colonoscopy have been reviewed with the patient.  Questions have been answered.  All parties agreeable.

## 2015-06-17 NOTE — Anesthesia Preprocedure Evaluation (Signed)
Anesthesia Evaluation  Patient identified by MRN, date of birth, ID band  Reviewed: Allergy & Precautions, H&P , NPO status , Patient's Chart, lab work & pertinent test results  Airway Mallampati: II  TM Distance: >3 FB Neck ROM: full    Dental no notable dental hx.    Pulmonary former smoker,    Pulmonary exam normal        Cardiovascular hypertension,  Rhythm:regular Rate:Normal     Neuro/Psych    GI/Hepatic GERD  ,  Endo/Other  Hypothyroidism   Renal/GU      Musculoskeletal   Abdominal   Peds  Hematology   Anesthesia Other Findings   Reproductive/Obstetrics                             Anesthesia Physical Anesthesia Plan  ASA: II  Anesthesia Plan: MAC   Post-op Pain Management:    Induction:   Airway Management Planned:   Additional Equipment:   Intra-op Plan:   Post-operative Plan:   Informed Consent: I have reviewed the patients History and Physical, chart, labs and discussed the procedure including the risks, benefits and alternatives for the proposed anesthesia with the patient or authorized representative who has indicated his/her understanding and acceptance.     Plan Discussed with: CRNA  Anesthesia Plan Comments:         Anesthesia Quick Evaluation

## 2015-06-17 NOTE — Anesthesia Procedure Notes (Signed)
Procedure Name: MAC Date/Time: 06/17/2015 8:33 AM Performed by: Cameron Ali Pre-anesthesia Checklist: Patient identified, Emergency Drugs available, Suction available, Timeout performed and Patient being monitored Patient Re-evaluated:Patient Re-evaluated prior to inductionOxygen Delivery Method: Nasal cannula Placement Confirmation: positive ETCO2

## 2015-06-17 NOTE — Telephone Encounter (Signed)
-----   Message from Margarita Rana, MD sent at 06/14/2015  1:39 PM EDT ----- Bone density shows bone thinning.  If not history of fracture, 10 year risk of hip fracture is 1.5 percent,  If had previous fracture, goes up to 2.5 percent.  Still below threshold for treatment, but only slightly. Eat healthy, exercise.  Take Vit d supplement and recheck in 2 years.

## 2015-06-17 NOTE — Telephone Encounter (Signed)
LMTCB 06/17/2015  Thanks,   -Mickel Baas

## 2015-06-18 ENCOUNTER — Encounter: Payer: Self-pay | Admitting: Gastroenterology

## 2015-06-19 ENCOUNTER — Encounter: Payer: Self-pay | Admitting: Gastroenterology

## 2015-06-26 ENCOUNTER — Other Ambulatory Visit: Payer: Self-pay | Admitting: Family Medicine

## 2015-06-26 ENCOUNTER — Ambulatory Visit
Admission: RE | Admit: 2015-06-26 | Discharge: 2015-06-26 | Disposition: A | Discharge status: Home / Self Care | Payer: Federal, State, Local not specified - PPO | Source: Ambulatory Visit | Attending: Family Medicine | Admitting: Family Medicine

## 2015-06-26 ENCOUNTER — Encounter: Payer: Self-pay | Admitting: Family Medicine

## 2015-06-26 ENCOUNTER — Inpatient Hospital Stay: Payer: Federal, State, Local not specified - PPO | Attending: Family Medicine | Admitting: Family Medicine

## 2015-06-26 DIAGNOSIS — Z87891 Personal history of nicotine dependence: Secondary | ICD-10-CM | POA: Insufficient documentation

## 2015-06-26 DIAGNOSIS — I2510 Atherosclerotic heart disease of native coronary artery without angina pectoris: Secondary | ICD-10-CM | POA: Diagnosis not present

## 2015-06-26 DIAGNOSIS — K449 Diaphragmatic hernia without obstruction or gangrene: Secondary | ICD-10-CM | POA: Insufficient documentation

## 2015-06-26 DIAGNOSIS — R918 Other nonspecific abnormal finding of lung field: Secondary | ICD-10-CM | POA: Insufficient documentation

## 2015-06-26 DIAGNOSIS — Z122 Encounter for screening for malignant neoplasm of respiratory organs: Secondary | ICD-10-CM

## 2015-06-26 DIAGNOSIS — J432 Centrilobular emphysema: Secondary | ICD-10-CM | POA: Diagnosis not present

## 2015-06-26 HISTORY — DX: Personal history of nicotine dependence: Z87.891

## 2015-06-26 NOTE — Progress Notes (Addendum)
In accordance with CMS guidelines, patient has meet eligibility criteria including age, absence of signs or symptoms of lung cancer, the specific calculation of cigarette smoking pack-years was 30 years and is a former smoker, having quit 12 years ago.   A shared decision-making session was conducted prior to the performance of CT scan. This includes one or more decision aids, includes benefits and harms of screening, follow-up diagnostic testing, over-diagnosis, false positive rate, and total radiation exposure.  Counseling on the importance of adherence to annual lung cancer LDCT screening, impact of co-morbidities, and ability or willingness to undergo diagnosis and treatment is imperative for compliance of the program.  Counseling on the importance of continued smoking cessation for former smokers; the importance of smoking cessation for current smokers and information about tobacco cessation interventions have been given to patient including the [place] at [facility], 1800 quit [place], as well as [place] specific smoking cessation programs.  Written order for lung cancer screening with LDCT has been given to the patient and any and all questions have been answered to the best of my abilities.   Yearly follow up will be scheduled by Burgess Estelle, Thoracic Navigator.

## 2015-06-28 ENCOUNTER — Telehealth: Payer: Self-pay | Admitting: *Deleted

## 2015-06-28 NOTE — Telephone Encounter (Signed)
Notified patient of LDCT lung cancer screening results of Lung Rads 3 finding with recommendation for 12 month follow up imaging. Also notified of incidental finding noted below. Patient verbalizes understanding.   IMPRESSION: 1. Lung-Rads category 3, probably benign findings. Short-term follow-up in 6 months is recommended with repeat low-dose chest CT without contrast (please use the following order, "CT CHEST LCS NODULE FOLLOW-UP W/O CM"). Centrilobular emphysema with pulmonary nodules measuring maximally 6.5 mm. These results will be called to the ordering clinician or representative by the Radiologist Assistant, and communication documented in the PACS or zVision Dashboard. 2. Age advanced coronary artery atherosclerosis. Recommend assessment of coronary risk factors and consideration of medical therapy. 3. Small hiatal hernia.

## 2015-07-01 ENCOUNTER — Encounter: Payer: Self-pay | Admitting: Family Medicine

## 2015-07-04 ENCOUNTER — Encounter: Payer: Self-pay | Admitting: Family Medicine

## 2015-07-04 DIAGNOSIS — R9389 Abnormal findings on diagnostic imaging of other specified body structures: Secondary | ICD-10-CM

## 2015-07-08 ENCOUNTER — Encounter: Payer: Self-pay | Admitting: Gastroenterology

## 2015-07-08 ENCOUNTER — Telehealth: Payer: Self-pay

## 2015-07-08 NOTE — Telephone Encounter (Signed)
Please review pathology 

## 2015-07-22 DIAGNOSIS — I2510 Atherosclerotic heart disease of native coronary artery without angina pectoris: Secondary | ICD-10-CM | POA: Insufficient documentation

## 2015-08-13 ENCOUNTER — Ambulatory Visit (INDEPENDENT_AMBULATORY_CARE_PROVIDER_SITE_OTHER): Payer: Federal, State, Local not specified - PPO | Admitting: Certified Nurse Midwife

## 2015-08-13 ENCOUNTER — Encounter: Payer: Self-pay | Admitting: Certified Nurse Midwife

## 2015-08-13 VITALS — BP 116/68 | HR 70 | Resp 16 | Ht 62.75 in | Wt 142.0 lb

## 2015-08-13 DIAGNOSIS — Z124 Encounter for screening for malignant neoplasm of cervix: Secondary | ICD-10-CM | POA: Diagnosis not present

## 2015-08-13 DIAGNOSIS — Z01419 Encounter for gynecological examination (general) (routine) without abnormal findings: Secondary | ICD-10-CM | POA: Diagnosis not present

## 2015-08-13 DIAGNOSIS — Z0000 Encounter for general adult medical examination without abnormal findings: Secondary | ICD-10-CM | POA: Diagnosis not present

## 2015-08-13 DIAGNOSIS — Z8739 Personal history of other diseases of the musculoskeletal system and connective tissue: Secondary | ICD-10-CM | POA: Diagnosis not present

## 2015-08-13 LAB — POCT URINALYSIS DIPSTICK
Bilirubin, UA: NEGATIVE
Blood, UA: NEGATIVE
GLUCOSE UA: NEGATIVE
KETONES UA: NEGATIVE
Leukocytes, UA: NEGATIVE
Nitrite, UA: NEGATIVE
Protein, UA: NEGATIVE
Urobilinogen, UA: NEGATIVE
pH, UA: 5

## 2015-08-13 LAB — CBC
HEMATOCRIT: 35.8 % (ref 35.0–45.0)
HEMOGLOBIN: 11.9 g/dL (ref 11.7–15.5)
MCH: 30.4 pg (ref 27.0–33.0)
MCHC: 33.2 g/dL (ref 32.0–36.0)
MCV: 91.3 fL (ref 80.0–100.0)
MPV: 9.3 fL (ref 7.5–12.5)
Platelets: 265 10*3/uL (ref 140–400)
RBC: 3.92 MIL/uL (ref 3.80–5.10)
RDW: 14.1 % (ref 11.0–15.0)
WBC: 6 10*3/uL (ref 3.8–10.8)

## 2015-08-13 NOTE — Progress Notes (Signed)
70 y.o. G11P1001 Married  Caucasian Fe here for annual exam. Menopausal no vaginal bleeding. 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 Seeing PCP yearly for labs and aex. Sees PCP for cholesterol/hypothyroid,hypertnesion,adderall medication with labs. Hypothyroid medication increased. No health issues today.  Patient's last menstrual period was 03/23/1994.          Sexually active: Yes.    The current method of family planning is tubal ligation.    Exercising: No.  exercise Smoker:  no  Health Maintenance: Pap:  08-12-14 neg  Abnormal pap history with LEEP years ago. MMG:  05-15-15 rt breast category b density birads 1:neg Colonoscopy:  2017 neg f/u 72yrs BMD:   2017 TDaP:  2011 Shingles: 2015 Pneumonia: 2016 Hep C and HIV: not done Labs: poct urine-neg, hgb-11.5 Self breast exam: done occ   reports that she quit smoking about 11 years ago. She has never used smokeless tobacco. She reports that she drinks about 4.2 oz of alcohol per week. She reports that she does not use illicit drugs.  Past Medical History  Diagnosis Date  . HSV-2 (herpes simplex virus 2) infection   . Hyperlipidemia   . Hypertension   . Breast cancer ([place])     Left; Age 43  . S/P appendectomy 1981  . Anemia   . Abnormal Pap smear of cervix 3/15    ASCUS HPV not detected  . Genital herpes 03/26/2009  . Hypothyroidism   . GERD (gastroesophageal reflux disease)   . ADD (attention deficit disorder)   . Arthritis     fingers  . Vertigo     x1 - approx 5 yrs ago  . Personal history of tobacco use, presenting hazards to health 06/26/2015    Past Surgical History  Procedure Laterality Date  . Tonsillectomy  child  . Tubal ligation    . Cesarean section  1985  . Mastectomy Left     new implant on the Right  . Augmentation mammaplasty  1982  . Breast surgery  2005    Breast Implant replaceddue to leaking  . Breast surgery  Left 2006    due to asymmetry  . Cervical biopsy  w/ loop electrode excision      unsure pathology   . Appendectomy    . Colonoscopy with propofol N/A 06/17/2015    Procedure: COLONOSCOPY WITH PROPOFOL;  Surgeon: Lucilla Lame, MD;  Location: [place];  Service: Endoscopy;  Laterality: N/A;  PT PREFERS EARLY AM  . Polypectomy  06/17/2015    Procedure: POLYPECTOMY;  Surgeon: Lucilla Lame, MD;  Location: [place];  Service: Endoscopy;;    Current Outpatient Prescriptions  Medication Sig Dispense Refill  . amphetamine-dextroamphetamine (ADDERALL) 20 MG tablet Take 20 mg by mouth daily.    . Ascorbic Acid (VITAMIN C PO) Take by mouth.    . cholecalciferol (VITAMIN D) 1000 UNITS tablet Take 1,000 Units by mouth daily.    . cyanocobalamin (,VITAMIN B-12,) 1000 MCG/ML injection USE 1 ML PER INJECTION, IM WEEKLY 10 mL 3  . doxepin (SINEQUAN) 10 MG capsule Take 1 capsule (10 mg total) by mouth at bedtime. 30 capsule 4  . Estradiol (VAGIFEM) 10 MCG TABS vaginal tablet Place 1 tablet (10 mcg total) vaginally 2 (two) times a week. 24 tablet 4  . ferrous sulfate 324 (65 FE) MG TBEC Take by mouth as needed. Reported on 06/17/2015    . levothyroxine (SYNTHROID, LEVOTHROID)  75 MCG tablet TAKE 1 TABLET BY MOUTH DAILY 30 tablet 12  . losartan (COZAAR) 25 MG tablet Take 1 tablet (25 mg total) by mouth daily. 30 tablet 5  . Multiple Vitamins-Minerals (MULTIVITAMIN PO) Take by mouth.    . Probiotic Product (PROBIOTIC PO) Take by mouth daily.    . S-Adenosylmethionine (SAM-E PO) Take by mouth daily.    . valACYclovir (VALTREX) 500 MG tablet One daily and increase to 2 x daily x 3 d at onset of outbreak 45 tablet 12   No current facility-administered medications for this visit.    Family History  Problem Relation Age of Onset  . Breast cancer Mother 5  . Heart disease Father   . Skin cancer Brother   . Crohn's disease Brother     ROS:  Pertinent items are noted in HPI.  Otherwise, a  comprehensive ROS was negative.  Exam:   BP 116/68 mmHg  Pulse 70  Resp 16  Ht 5' 2.75" (1.594 m)  Wt 142 lb (64.411 kg)  BMI 25.35 kg/m2  LMP 03/23/1994 Height: 5' 2.75" (159.4 cm) Ht Readings from Last 3 Encounters:  08/13/15 5' 2.75" (1.594 m)  06/26/15 5\' 3"  (1.6 m)  06/17/15 5\' 3"  (1.6 m)    General appearance: alert, cooperative and appears stated age Head: Normocephalic, without obvious abnormality, atraumatic Neck: no adenopathy, supple, symmetrical, trachea midline and thyroid normal to inspection and palpation Lungs: clear to auscultation bilaterally Breasts: normal appearance, no masses or tenderness, No nipple retraction or dimpling, No nipple discharge or bleeding, No axillary or supraclavicular adenopathy, mastectomy scarring and bilateral  Implants feel intact Heart: regular rate and rhythm Abdomen: soft, non-tender; no masses,  no organomegaly Extremities: extremities normal, atraumatic, no cyanosis or edema Skin: Skin color, texture, turgor normal. No rashes or lesions Lymph nodes: Cervical, supraclavicular, and axillary nodes normal. No abnormal inguinal nodes palpated Neurologic: Grossly normal   Pelvic: External genitalia:  no lesions              Urethra:  normal appearing urethra with no masses, tenderness or lesions              Bartholin's and Skene's: normal                 Vagina: atrophic appearing vagina with normal color and discharge, no lesions              Cervix: normal,non tender, no lesions, adhered to posterior vaginal wall              Pap taken: Yes.   Bimanual Exam:  Uterus:  normal size, contour, position, consistency, mobility, non-tender              Adnexa: normal adnexa and no mass, fullness, tenderness               Rectovaginal: Confirms               Anus:  normal sphincter tone, no lesions  Chaperone present: yes  A:  Well Woman with normal exam  Menopausal no HRT  Atrophic vaginitis with Vagifem use with good  results  Cholesterol/hypertension/hypothyroid with MD management  Screening labs  P:   Reviewed health and wellness pertinent to exam  Aware of need to evaluate if vaginal bleeding  Risks and benefits reviewed with Vagifem use, desires continuance.  Rx vagifem see order  Continue MD follow up as indicated  Lab: HIV,Hep C, Vitamin D,CBC   Pap smear  as above   counseled on breast self exam, mammography screening, osteoporosis, adequate intake of calcium and vitamin D, diet and exercise  return annually or prn  An After Visit Summary was printed and given to the patient.

## 2015-08-13 NOTE — Progress Notes (Signed)
Reviewed personally.  M. Suzanne Gregrey Bloyd, MD.  

## 2015-08-13 NOTE — Patient Instructions (Signed)

## 2015-08-14 LAB — HIV ANTIBODY (ROUTINE TESTING W REFLEX): HIV 1&2 Ab, 4th Generation: NONREACTIVE

## 2015-08-14 LAB — HEPATITIS C ANTIBODY: HCV Ab: NEGATIVE

## 2015-08-14 LAB — IPS PAP SMEAR ONLY

## 2015-08-14 LAB — VITAMIN D 25 HYDROXY (VIT D DEFICIENCY, FRACTURES): Vit D, 25-Hydroxy: 36 ng/mL (ref 30–100)

## 2015-08-15 LAB — HEMOGLOBIN, FINGERSTICK: Hemoglobin, fingerstick: 11.5 g/dL — ABNORMAL LOW (ref 12.0–16.0)

## 2015-08-28 ENCOUNTER — Other Ambulatory Visit: Payer: Self-pay | Admitting: Family Medicine

## 2015-09-14 ENCOUNTER — Other Ambulatory Visit: Payer: Self-pay | Admitting: Certified Nurse Midwife

## 2015-09-16 NOTE — Telephone Encounter (Signed)
Medication refill request: valACYclovir 500mg  Last AEX:  08/13/15 DL Next AEX: 08/18/16 Last MMG (if hormonal medication request): 05/15/15 BIRADS1 negative  Refill authorized: 08/05/14 #45 w/12 refills; today #45 w/12refills? Please advise

## 2015-09-19 ENCOUNTER — Other Ambulatory Visit: Payer: Self-pay | Admitting: Certified Nurse Midwife

## 2015-09-19 NOTE — Telephone Encounter (Signed)
Medication refill request: YUVAFEM 10MCG Last AEX:  08/13/15 DL Next AEX: 08/18/16 Last MMG (if hormonal medication request): 05/15/15 BIRADS1 negative Refill authorized: 08/06/14 #24tablets w/4 refills; today #24 w/3 refills? Please advise

## 2015-09-27 ENCOUNTER — Other Ambulatory Visit: Payer: Self-pay

## 2015-09-27 DIAGNOSIS — G4700 Insomnia, unspecified: Secondary | ICD-10-CM

## 2015-09-27 DIAGNOSIS — I10 Essential (primary) hypertension: Secondary | ICD-10-CM

## 2015-09-27 MED ORDER — LOSARTAN POTASSIUM 25 MG PO TABS: 25.0000 mg | ORAL_TABLET | Freq: Every day | ORAL | Status: DC

## 2015-09-27 MED ORDER — DOXEPIN HCL 10 MG PO CAPS: 10.0000 mg | ORAL_CAPSULE | Freq: Every day | ORAL | Status: DC

## 2015-09-27 NOTE — Telephone Encounter (Signed)
Pharmacy requesting 90 day supply

## 2015-12-24 ENCOUNTER — Telehealth: Payer: Self-pay | Admitting: *Deleted

## 2015-12-24 NOTE — Telephone Encounter (Signed)
Left voicemail for patient notifyng them that it is time to schedule lung cancer screening CT scan. Instructed patient to call back to verify information prior to the scan being scheduled.

## 2015-12-24 NOTE — Telephone Encounter (Signed)
Notified patient that lung cancer screening follow up CT scan is due. Confirmed that patient is within the age range of 55-77, and asymptomatic, (no signs or symptoms of lung cancer). Patient denies illness that would prevent curative treatment for lung cancer if found. The patient is a former smoker, quit 2005, with a 35 pack year history. The shared decision making visit was done 4/5/f17. Patient is agreeable for CT scan being scheduled.

## 2016-01-02 ENCOUNTER — Other Ambulatory Visit: Payer: Self-pay | Admitting: *Deleted

## 2016-01-02 DIAGNOSIS — R9389 Abnormal findings on diagnostic imaging of other specified body structures: Secondary | ICD-10-CM

## 2016-01-08 ENCOUNTER — Telehealth: Payer: Self-pay | Admitting: Certified Nurse Midwife

## 2016-01-08 NOTE — Telephone Encounter (Signed)
Spoke with patient. Patient states that 1 week ago she found a lump in her right breast. She has been monitoring the area and reports the lump is still present. Lump is tender to the touch. Denies any breast swelling, redness, or nipple discharge. Requesting an appointment with Melvia Heaps CNM. Appointment scheduled for tomorrow 01/09/2016 at 12:45 pm with Melvia Heaps CNM. Patient is agreeable and verbalizes understanding.  Routing to provider for final review. Patient agreeable to disposition. Will close encounter.

## 2016-01-08 NOTE — Telephone Encounter (Signed)
Patient would like to see Melvia Heaps, CNM for "lumps in right breast".

## 2016-01-09 ENCOUNTER — Ambulatory Visit (INDEPENDENT_AMBULATORY_CARE_PROVIDER_SITE_OTHER): Payer: Federal, State, Local not specified - PPO | Admitting: Certified Nurse Midwife

## 2016-01-09 ENCOUNTER — Ambulatory Visit
Admission: RE | Admit: 2016-01-09 | Discharge: 2016-01-09 | Disposition: A | Discharge status: Home / Self Care | Payer: Federal, State, Local not specified - PPO | Source: Ambulatory Visit | Attending: Oncology | Admitting: Oncology

## 2016-01-09 ENCOUNTER — Encounter: Payer: Self-pay | Admitting: Certified Nurse Midwife

## 2016-01-09 VITALS — BP 110/64 | HR 70 | Resp 16 | Ht 62.75 in | Wt 146.0 lb

## 2016-01-09 DIAGNOSIS — K449 Diaphragmatic hernia without obstruction or gangrene: Secondary | ICD-10-CM | POA: Insufficient documentation

## 2016-01-09 DIAGNOSIS — J439 Emphysema, unspecified: Secondary | ICD-10-CM | POA: Insufficient documentation

## 2016-01-09 DIAGNOSIS — N6310 Unspecified lump in the right breast, unspecified quadrant: Secondary | ICD-10-CM | POA: Diagnosis not present

## 2016-01-09 DIAGNOSIS — Z803 Family history of malignant neoplasm of breast: Secondary | ICD-10-CM | POA: Diagnosis not present

## 2016-01-09 DIAGNOSIS — I2510 Atherosclerotic heart disease of native coronary artery without angina pectoris: Secondary | ICD-10-CM | POA: Diagnosis not present

## 2016-01-09 DIAGNOSIS — R938 Abnormal findings on diagnostic imaging of other specified body structures: Secondary | ICD-10-CM | POA: Diagnosis present

## 2016-01-09 DIAGNOSIS — R9389 Abnormal findings on diagnostic imaging of other specified body structures: Secondary | ICD-10-CM

## 2016-01-09 DIAGNOSIS — Z853 Personal history of malignant neoplasm of breast: Secondary | ICD-10-CM | POA: Diagnosis not present

## 2016-01-09 NOTE — Progress Notes (Signed)
Patient scheduled for Diagnostic MMG and ultrasound for 01/15/16 at 1:20pm at [facility]. Patient agreeable to date and time.

## 2016-01-09 NOTE — Progress Notes (Signed)
   Subjective:   70 y.o. MarriedCaucasian female presents for evaluation of right breast mass. Onset of the symptoms was 2 weeks. Patient sought evaluation because of breast lump and breast tenderness Contributing factors include mom(63) with breast CA. Denies chills, fevers and or skin change. Patient denies history of trauma, bites, or injuries. Last mammogram was 10 months ago, negative. Does have silcone and one saline implant >20 years..     Review of Systems Pertinent to HPI   Objective:   General appearance: alert, cooperative and appears stated age  Breasts: normal appearance, no masses or tenderness, No nipple retraction or dimpling, No nipple discharge or bleeding, No axillary or supraclavicular adenopathy, patient has bilateral implants and transflap on left with saline implant, right breast mass noted at 6 o'clock in central aerola area, firm 2-3 cm, area also noted at 3 o'clock implant feel which transects area of mass,  ? ruptured implant vs mass  Implant in right breast silicone.Area also is tender, no redness or enlarged lymph nodes.Patient had breast cancer in left breast with total mastectomy and then reconstruction. Assessment:   ASSESSMENT:Patient is diagnosed with breast mass  Right vs ruptured silicone implant in right  Plan:   PLAN: Discussed finding with patient and need for evaluation. Patient agreeable to [place] and diagnostic mammogram. Will schedule prior to leaving office.

## 2016-01-10 ENCOUNTER — Telehealth: Payer: Self-pay | Admitting: *Deleted

## 2016-01-10 NOTE — Telephone Encounter (Signed)
Voicemail left in attempt to notify patient of LDCT lung cancer screening results with recommendation for 12 month follow up imaging. Also upon return call, patient will be notified of incidental finding noted below. This note will be sent to PCP via Epic.  IMPRESSION: 1. Lung-RADS Category 2, benign appearance or behavior. Continue annual screening with low-dose chest CT without contrast in 12 months. 2. Emphysema. 3. Coronary artery atherosclerosis. 4. Small hiatal hernia.

## 2016-01-15 ENCOUNTER — Other Ambulatory Visit: Payer: Federal, State, Local not specified - PPO

## 2016-01-15 ENCOUNTER — Ambulatory Visit
Admission: RE | Admit: 2016-01-15 | Discharge: 2016-01-15 | Disposition: A | Discharge status: Home / Self Care | Payer: Federal, State, Local not specified - PPO | Source: Ambulatory Visit | Attending: Certified Nurse Midwife | Admitting: Certified Nurse Midwife

## 2016-01-15 DIAGNOSIS — Z803 Family history of malignant neoplasm of breast: Secondary | ICD-10-CM

## 2016-01-15 DIAGNOSIS — N6310 Unspecified lump in the right breast, unspecified quadrant: Secondary | ICD-10-CM

## 2016-01-15 DIAGNOSIS — Z853 Personal history of malignant neoplasm of breast: Secondary | ICD-10-CM

## 2016-01-16 ENCOUNTER — Telehealth: Payer: Self-pay | Admitting: *Deleted

## 2016-01-16 ENCOUNTER — Telehealth: Payer: Self-pay | Admitting: Certified Nurse Midwife

## 2016-01-16 NOTE — Telephone Encounter (Signed)
Patient returned call

## 2016-01-16 NOTE — Telephone Encounter (Signed)
Left message to call Sharee Pimple at [phone].    Schedule 6 week follow-up for breast recheck

## 2016-01-16 NOTE — Telephone Encounter (Signed)
-----   Message from Regina Eck, CNM sent at 01/16/2016  8:00 AM EDT ----- Reviewed result no change in implant noted in right breast, but patient was having pain, will need follow up visit as usual See [place] report also Continue mm hold

## 2016-01-16 NOTE — Telephone Encounter (Signed)
Spoke with patient. Advised as seen below per Melvia Heaps, CNM. Patient scheduled for 6wk re-check 02/25/16 at 10:00am. Patient asking if Melvia Heaps, CNM could refer a plastic surgeon. Patient states she has checked with Dr. Stephanie Coup, but states they don't take insurance. Patient also would like to know recommendations for breast MRI with silicone implants. Patient states she read on [facility] that initial MRI should be done after 3 yrs of placement then every 2 years following. Advised I review with Melvia Heaps, CNM for recommendations and return call. Patient verbalizes understanding and is agreeable.  Melvia Heaps, CNM, please advise?

## 2016-01-16 NOTE — Telephone Encounter (Signed)
Patient returned call. Previous phone note closed in error--please see for reference.

## 2016-01-17 NOTE — Progress Notes (Signed)
Encounter reviewed Jill Jertson, MD   

## 2016-01-17 NOTE — Telephone Encounter (Signed)
Spoke with patient. Advised as seen below. Patient verbalizes understanding and is agreeable.  Routing to provider for final review. Patient is agreeable to disposition. Will close encounter.

## 2016-01-17 NOTE — Telephone Encounter (Signed)
Dr. Audrea Muscat Contagiannis or Dr. Crissie Reese

## 2016-02-18 ENCOUNTER — Telehealth: Payer: Self-pay | Admitting: Certified Nurse Midwife

## 2016-02-18 NOTE — Telephone Encounter (Signed)
Patient cancelled her breast recheck appointment. She says she is fine and does not need to follow up.

## 2016-02-19 NOTE — Telephone Encounter (Signed)
Spoke with patient, advised as seen below per Melvia Heaps, CNM. Patient states she had the MMG and ultrasound in October and was reported as negative/normal. Patient states she was recommended to f/u with a plastic surgeon and has appt in January to consult about having implants removed and replaced so she sees no reason for f/u appt at this time. Advised again of recommendations, will notify Melvia Heaps, CNM and return call for any additional recommendations. Patient declines appt at this time.  Patient verbalizes understanding.

## 2016-02-19 NOTE — Telephone Encounter (Signed)
Patient needs follow up exam due to tenderness see mammogram and [place] previously done

## 2016-02-19 NOTE — Telephone Encounter (Signed)
Left detailed message, ok per current dpr. Advised as seen below per Melvia Heaps, CNM -call office for any additional questions/concerns.  Routing to provider for final review. Patient is agreeable to disposition. Will close encounter.

## 2016-02-19 NOTE — Telephone Encounter (Signed)
If having follow up and no tenderness will not have her come in.

## 2016-02-25 ENCOUNTER — Ambulatory Visit: Payer: Self-pay | Admitting: Certified Nurse Midwife

## 2016-03-06 ENCOUNTER — Other Ambulatory Visit: Payer: Self-pay | Admitting: Physician Assistant

## 2016-03-06 DIAGNOSIS — I10 Essential (primary) hypertension: Secondary | ICD-10-CM

## 2016-03-06 DIAGNOSIS — G4700 Insomnia, unspecified: Secondary | ICD-10-CM

## 2016-03-23 HISTORY — PX: AUGMENTATION MAMMAPLASTY: SUR837

## 2016-04-15 ENCOUNTER — Encounter (HOSPITAL_BASED_OUTPATIENT_CLINIC_OR_DEPARTMENT_OTHER): Payer: Self-pay | Admitting: *Deleted

## 2016-04-15 NOTE — Progress Notes (Signed)
Bring all medications.Coming tomorrow for BMET and EKG.

## 2016-04-16 ENCOUNTER — Other Ambulatory Visit: Payer: Self-pay

## 2016-04-16 ENCOUNTER — Encounter (HOSPITAL_BASED_OUTPATIENT_CLINIC_OR_DEPARTMENT_OTHER)
Admission: RE | Admit: 2016-04-16 | Discharge: 2016-04-16 | Disposition: A | Discharge status: Home / Self Care | Payer: Federal, State, Local not specified - PPO | Source: Ambulatory Visit | Attending: Plastic Surgery | Admitting: Plastic Surgery

## 2016-04-16 DIAGNOSIS — I10 Essential (primary) hypertension: Secondary | ICD-10-CM | POA: Insufficient documentation

## 2016-04-16 DIAGNOSIS — Z01812 Encounter for preprocedural laboratory examination: Secondary | ICD-10-CM | POA: Diagnosis not present

## 2016-04-16 DIAGNOSIS — C50919 Malignant neoplasm of unspecified site of unspecified female breast: Secondary | ICD-10-CM | POA: Diagnosis not present

## 2016-04-16 DIAGNOSIS — Z01818 Encounter for other preprocedural examination: Secondary | ICD-10-CM | POA: Insufficient documentation

## 2016-04-16 LAB — BASIC METABOLIC PANEL
ANION GAP: 8 (ref 5–15)
BUN: 13 mg/dL (ref 6–20)
CHLORIDE: 104 mmol/L (ref 101–111)
CO2: 26 mmol/L (ref 22–32)
CREATININE: 0.61 mg/dL (ref 0.44–1.00)
Calcium: 9.8 mg/dL (ref 8.9–10.3)
GFR calc Af Amer: >60 mL/min (ref >60)
GFR calc non Af Amer: >60 mL/min (ref >60)
Glucose, Bld: 100 mg/dL — ABNORMAL HIGH (ref 65–99)
POTASSIUM: 3.9 mmol/L (ref 3.5–5.1)
Sodium: 138 mmol/L (ref 135–145)

## 2016-04-20 ENCOUNTER — Ambulatory Visit: Payer: Self-pay | Admitting: Plastic Surgery

## 2016-04-21 ENCOUNTER — Ambulatory Visit (HOSPITAL_BASED_OUTPATIENT_CLINIC_OR_DEPARTMENT_OTHER)
Admission: RE | Admit: 2016-04-21 | Discharge: 2016-04-21 | Disposition: A | Discharge status: Home / Self Care | Payer: Federal, State, Local not specified - PPO | Source: Ambulatory Visit | Attending: Plastic Surgery | Admitting: Plastic Surgery

## 2016-04-21 ENCOUNTER — Ambulatory Visit (HOSPITAL_BASED_OUTPATIENT_CLINIC_OR_DEPARTMENT_OTHER): Payer: Federal, State, Local not specified - PPO | Admitting: Certified Registered"

## 2016-04-21 ENCOUNTER — Encounter (HOSPITAL_BASED_OUTPATIENT_CLINIC_OR_DEPARTMENT_OTHER): Payer: Self-pay | Admitting: Certified Registered"

## 2016-04-21 ENCOUNTER — Encounter (HOSPITAL_BASED_OUTPATIENT_CLINIC_OR_DEPARTMENT_OTHER)
Admission: RE | Disposition: A | Discharge status: Home / Self Care | Payer: Self-pay | Source: Ambulatory Visit | Attending: Plastic Surgery

## 2016-04-21 DIAGNOSIS — F419 Anxiety disorder, unspecified: Secondary | ICD-10-CM | POA: Diagnosis not present

## 2016-04-21 DIAGNOSIS — T8544XA Capsular contracture of breast implant, initial encounter: Secondary | ICD-10-CM | POA: Diagnosis present

## 2016-04-21 DIAGNOSIS — Z853 Personal history of malignant neoplasm of breast: Secondary | ICD-10-CM | POA: Diagnosis not present

## 2016-04-21 DIAGNOSIS — Z9851 Tubal ligation status: Secondary | ICD-10-CM | POA: Insufficient documentation

## 2016-04-21 DIAGNOSIS — E039 Hypothyroidism, unspecified: Secondary | ICD-10-CM | POA: Diagnosis not present

## 2016-04-21 DIAGNOSIS — D649 Anemia, unspecified: Secondary | ICD-10-CM | POA: Diagnosis not present

## 2016-04-21 DIAGNOSIS — N6481 Ptosis of breast: Secondary | ICD-10-CM | POA: Diagnosis not present

## 2016-04-21 DIAGNOSIS — F909 Attention-deficit hyperactivity disorder, unspecified type: Secondary | ICD-10-CM | POA: Insufficient documentation

## 2016-04-21 DIAGNOSIS — Z9013 Acquired absence of bilateral breasts and nipples: Secondary | ICD-10-CM | POA: Insufficient documentation

## 2016-04-21 DIAGNOSIS — M1990 Unspecified osteoarthritis, unspecified site: Secondary | ICD-10-CM | POA: Diagnosis not present

## 2016-04-21 DIAGNOSIS — Z9882 Breast implant status: Secondary | ICD-10-CM | POA: Insufficient documentation

## 2016-04-21 DIAGNOSIS — B009 Herpesviral infection, unspecified: Secondary | ICD-10-CM | POA: Diagnosis not present

## 2016-04-21 DIAGNOSIS — Z87891 Personal history of nicotine dependence: Secondary | ICD-10-CM | POA: Diagnosis not present

## 2016-04-21 DIAGNOSIS — E7800 Pure hypercholesterolemia, unspecified: Secondary | ICD-10-CM | POA: Insufficient documentation

## 2016-04-21 DIAGNOSIS — K219 Gastro-esophageal reflux disease without esophagitis: Secondary | ICD-10-CM | POA: Diagnosis not present

## 2016-04-21 DIAGNOSIS — I10 Essential (primary) hypertension: Secondary | ICD-10-CM | POA: Insufficient documentation

## 2016-04-21 DIAGNOSIS — Y838 Other surgical procedures as the cause of abnormal reaction of the patient, or of later complication, without mention of misadventure at the time of the procedure: Secondary | ICD-10-CM | POA: Insufficient documentation

## 2016-04-21 HISTORY — DX: Personal history of other specified conditions: Z87.898

## 2016-04-21 HISTORY — PX: MASTOPEXY: SHX5358

## 2016-04-21 HISTORY — PX: BREAST IMPLANT EXCHANGE: SHX6296

## 2016-04-21 LAB — POCT HEMOGLOBIN-HEMACUE: HEMOGLOBIN: 11.8 g/dL — AB (ref 12.0–15.0)

## 2016-04-21 SURGERY — REPLACEMENT, IMPLANT, BREAST
Anesthesia: General | Laterality: Right

## 2016-04-21 MED ORDER — SUGAMMADEX SODIUM 200 MG/2ML IV SOLN
INTRAVENOUS | Status: DC | PRN
  Administered 2016-04-21: 150 mg via INTRAVENOUS

## 2016-04-21 MED ORDER — ROCURONIUM BROMIDE 100 MG/10ML IV SOLN
INTRAVENOUS | Status: DC | PRN
  Administered 2016-04-21: 50 mg via INTRAVENOUS

## 2016-04-21 MED ORDER — SUGAMMADEX SODIUM 200 MG/2ML IV SOLN
INTRAVENOUS | Status: AC
  Filled 2016-04-21: qty 2

## 2016-04-21 MED ORDER — LIDOCAINE HCL (CARDIAC) 20 MG/ML IV SOLN
INTRAVENOUS | Status: DC | PRN
  Administered 2016-04-21: 80 mg via INTRAVENOUS

## 2016-04-21 MED ORDER — BUPIVACAINE-EPINEPHRINE 0.5% -1:200000 IJ SOLN
INTRAMUSCULAR | Status: DC | PRN
  Administered 2016-04-21: 30 mL

## 2016-04-21 MED ORDER — FENTANYL CITRATE (PF) 100 MCG/2ML IJ SOLN
INTRAMUSCULAR | Status: DC | PRN
  Administered 2016-04-21: 100 ug via INTRAVENOUS
  Administered 2016-04-21 (×2): 50 ug via INTRAVENOUS

## 2016-04-21 MED ORDER — PROPOFOL 500 MG/50ML IV EMUL
INTRAVENOUS | Status: AC
  Filled 2016-04-21: qty 50

## 2016-04-21 MED ORDER — MIDAZOLAM HCL 2 MG/2ML IJ SOLN: 1.0000 mg | INTRAMUSCULAR | Status: DC | PRN

## 2016-04-21 MED ORDER — DEXAMETHASONE SODIUM PHOSPHATE 10 MG/ML IJ SOLN
INTRAMUSCULAR | Status: AC
  Filled 2016-04-21: qty 1

## 2016-04-21 MED ORDER — FENTANYL CITRATE (PF) 100 MCG/2ML IJ SOLN
INTRAMUSCULAR | Status: AC
  Filled 2016-04-21: qty 2

## 2016-04-21 MED ORDER — CHLORHEXIDINE GLUCONATE CLOTH 2 % EX PADS: 6.0000 | MEDICATED_PAD | Freq: Once | CUTANEOUS | Status: DC

## 2016-04-21 MED ORDER — CEFAZOLIN IN D5W 1 GM/50ML IV SOLN
1.0000 g | Freq: Once | INTRAVENOUS | Status: DC
Start: 2016-04-21 — End: 2016-04-21

## 2016-04-21 MED ORDER — LACTATED RINGERS IV SOLN
INTRAVENOUS | Status: DC | PRN
  Administered 2016-04-21 (×3): via INTRAVENOUS

## 2016-04-21 MED ORDER — SODIUM CHLORIDE 0.9 % IR SOLN
Status: DC | PRN
  Administered 2016-04-21: 08:00:00

## 2016-04-21 MED ORDER — ONDANSETRON HCL 4 MG/2ML IJ SOLN
INTRAMUSCULAR | Status: AC
Start: 2016-04-21 — End: 2016-04-21
  Filled 2016-04-21: qty 2

## 2016-04-21 MED ORDER — FENTANYL CITRATE (PF) 100 MCG/2ML IJ SOLN: 50.0000 ug | INTRAMUSCULAR | Status: DC | PRN

## 2016-04-21 MED ORDER — LIDOCAINE 2% (20 MG/ML) 5 ML SYRINGE
INTRAMUSCULAR | Status: AC
  Filled 2016-04-21: qty 15

## 2016-04-21 MED ORDER — PROMETHAZINE HCL 25 MG/ML IJ SOLN: 6.2500 mg | INTRAMUSCULAR | Status: DC | PRN

## 2016-04-21 MED ORDER — PROPOFOL 10 MG/ML IV BOLUS
INTRAVENOUS | Status: DC | PRN
  Administered 2016-04-21: 150 mg via INTRAVENOUS

## 2016-04-21 MED ORDER — CEFAZOLIN SODIUM-DEXTROSE 2-4 GM/100ML-% IV SOLN
2.0000 g | INTRAVENOUS | Status: AC
  Administered 2016-04-21: 2 g via INTRAVENOUS

## 2016-04-21 MED ORDER — SODIUM CHLORIDE 0.9 % IV SOLN
INTRAVENOUS | Status: DC | PRN
  Administered 2016-04-21: 13:00:00

## 2016-04-21 MED ORDER — BACITRACIN ZINC 500 UNIT/GM EX OINT
TOPICAL_OINTMENT | CUTANEOUS | Status: DC | PRN
  Administered 2016-04-21: 1 via TOPICAL

## 2016-04-21 MED ORDER — HYDROMORPHONE HCL 1 MG/ML IJ SOLN
INTRAMUSCULAR | Status: AC
  Filled 2016-04-21: qty 1

## 2016-04-21 MED ORDER — LIDOCAINE HCL (PF) 1 % IJ SOLN
INTRAMUSCULAR | Status: DC | PRN
  Administered 2016-04-21: 30 mL

## 2016-04-21 MED ORDER — LIDOCAINE 2% (20 MG/ML) 5 ML SYRINGE
INTRAMUSCULAR | Status: AC
  Filled 2016-04-21: qty 5

## 2016-04-21 MED ORDER — SCOPOLAMINE 1 MG/3DAYS TD PT72: 1.0000 | MEDICATED_PATCH | Freq: Once | TRANSDERMAL | Status: DC | PRN

## 2016-04-21 MED ORDER — HYDROMORPHONE HCL 1 MG/ML IJ SOLN
0.2500 mg | INTRAMUSCULAR | Status: DC | PRN
  Administered 2016-04-21 (×3): 0.5 mg via INTRAVENOUS

## 2016-04-21 MED ORDER — DEXAMETHASONE SODIUM PHOSPHATE 4 MG/ML IJ SOLN
INTRAMUSCULAR | Status: DC | PRN
Start: 2016-04-21 — End: 2016-04-21
  Administered 2016-04-21: 10 mg via INTRAVENOUS

## 2016-04-21 MED ORDER — ONDANSETRON HCL 4 MG/2ML IJ SOLN
INTRAMUSCULAR | Status: AC
  Filled 2016-04-21: qty 6

## 2016-04-21 MED ORDER — ONDANSETRON HCL 4 MG/2ML IJ SOLN
INTRAMUSCULAR | Status: DC | PRN
  Administered 2016-04-21: 4 mg via INTRAVENOUS

## 2016-04-21 MED ORDER — CEFAZOLIN SODIUM-DEXTROSE 2-4 GM/100ML-% IV SOLN
INTRAVENOUS | Status: AC
  Filled 2016-04-21: qty 100

## 2016-04-21 MED ORDER — LACTATED RINGERS IV SOLN
INTRAVENOUS | Status: DC
  Administered 2016-04-21: 10 mL/h via INTRAVENOUS
  Administered 2016-04-21: 07:00:00 via INTRAVENOUS

## 2016-04-21 SURGICAL SUPPLY — 83 items
APL SKNCLS STERI-STRIP NONHPOA (GAUZE/BANDAGES/DRESSINGS) ×4
BAG DECANTER FOR FLEXI CONT (MISCELLANEOUS) ×3 IMPLANT
BENZOIN TINCTURE PRP APPL 2/3 (GAUZE/BANDAGES/DRESSINGS) ×6 IMPLANT
BLADE HEX COATED 2.75 (ELECTRODE) ×3 IMPLANT
BLADE SURG 10 STRL SS (BLADE) ×1 IMPLANT
BLADE SURG 15 STRL LF DISP TIS (BLADE) ×2 IMPLANT
BLADE SURG 15 STRL SS (BLADE) ×3
BNDG GAUZE ELAST 4 BULKY (GAUZE/BANDAGES/DRESSINGS) ×6 IMPLANT
CANISTER LIPO FAT HARVEST (MISCELLANEOUS) IMPLANT
CANISTER SUCT 1200ML W/VALVE (MISCELLANEOUS) ×3 IMPLANT
COVER BACK TABLE 60X90IN (DRAPES) ×3 IMPLANT
COVER MAYO STAND STRL (DRAPES) ×3 IMPLANT
DECANTER SPIKE VIAL GLASS SM (MISCELLANEOUS) ×3 IMPLANT
DRAIN CHANNEL 10M FLAT 3/4 FLT (DRAIN) IMPLANT
DRAIN CHANNEL 7F 3/4 FLAT (WOUND CARE) IMPLANT
DRAPE LAPAROSCOPIC ABDOMINAL (DRAPES) ×3 IMPLANT
DRSG EMULSION OIL 3X3 NADH (GAUZE/BANDAGES/DRESSINGS) ×4 IMPLANT
DRSG PAD ABDOMINAL 8X10 ST (GAUZE/BANDAGES/DRESSINGS) ×6 IMPLANT
DRSG TEGADERM 4X4.75 (GAUZE/BANDAGES/DRESSINGS) ×2 IMPLANT
ELECT BLADE 6.5 .24CM SHAFT (ELECTRODE) ×3 IMPLANT
ELECT NDL TIP 2.8 STRL (NEEDLE) IMPLANT
ELECT NEEDLE TIP 2.8 STRL (NEEDLE) IMPLANT
ELECT REM PT RETURN 9FT ADLT (ELECTROSURGICAL) ×3
ELECTRODE REM PT RTRN 9FT ADLT (ELECTROSURGICAL) ×2 IMPLANT
EVACUATOR SILICONE 100CC (DRAIN) IMPLANT
FILTER 7/8 IN (FILTER) ×3 IMPLANT
FILTER LIPOSUCTION (MISCELLANEOUS) IMPLANT
GAUZE SPONGE 4X4 12PLY STRL (GAUZE/BANDAGES/DRESSINGS) ×3 IMPLANT
GAUZE XEROFORM 1X8 LF (GAUZE/BANDAGES/DRESSINGS) ×1 IMPLANT
GLOVE BIO SURGEON STRL SZ7 (GLOVE) ×6 IMPLANT
GOWN STRL REUS W/ TWL LRG LVL3 (GOWN DISPOSABLE) IMPLANT
GOWN STRL REUS W/ TWL XL LVL3 (GOWN DISPOSABLE) IMPLANT
GOWN STRL REUS W/TWL LRG LVL3 (GOWN DISPOSABLE)
GOWN STRL REUS W/TWL XL LVL3 (GOWN DISPOSABLE)
IMPL GEL 325CC MOD PLUS (Breast) IMPLANT
IMPL GEL SMOOTH RND 400CC (Breast) IMPLANT
IMPLANT GEL 325CC MOD PLUS (Breast) ×3 IMPLANT
IMPLANT GEL SMOOTH RND 400CC (Breast) ×3 IMPLANT
IV LACTATED RINGERS 1000ML (IV SOLUTION) IMPLANT
KIT FILL SYSTEM UNIVERSAL (SET/KITS/TRAYS/PACK) ×2 IMPLANT
NDL HYPO 25X1 1.5 SAFETY (NEEDLE) ×4 IMPLANT
NDL KEITH (NEEDLE) IMPLANT
NDL SAFETY ECLIPSE 18X1.5 (NEEDLE) IMPLANT
NDL SPNL 18GX3.5 QUINCKE PK (NEEDLE) IMPLANT
NDL SPNL 22GX7 QUINCKE BK (NEEDLE) ×2 IMPLANT
NEEDLE HYPO 18GX1.5 SHARP (NEEDLE)
NEEDLE HYPO 25X1 1.5 SAFETY (NEEDLE) ×6 IMPLANT
NEEDLE KEITH (NEEDLE) ×3 IMPLANT
NEEDLE SPNL 18GX3.5 QUINCKE PK (NEEDLE) IMPLANT
NEEDLE SPNL 22GX7 QUINCKE BK (NEEDLE) ×3 IMPLANT
NS IRRIG 1000ML POUR BTL (IV SOLUTION) ×3 IMPLANT
PACK BASIN DAY SURGERY FS (CUSTOM PROCEDURE TRAY) ×3 IMPLANT
PIN SAFETY STERILE (MISCELLANEOUS) IMPLANT
SHEET MEDIUM DRAPE 40X70 STRL (DRAPES) IMPLANT
SIZER BREAST SGL USE 325CC (SIZER) ×3
SIZER BREAST SGL USE 400CC (SIZER) ×3
SIZER BRST SGL USE 325CC (SIZER) IMPLANT
SIZER BRST SGL USE 400CC (SIZER) IMPLANT
SPECIMEN JAR MEDIUM (MISCELLANEOUS) IMPLANT
SPONGE GAUZE 4X4 12PLY STER LF (GAUZE/BANDAGES/DRESSINGS) IMPLANT
SPONGE LAP 18X18 X RAY DECT (DISPOSABLE) ×7 IMPLANT
STAPLER VISISTAT 35W (STAPLE) ×4 IMPLANT
STRIP CLOSURE SKIN 1/2X4 (GAUZE/BANDAGES/DRESSINGS) ×7 IMPLANT
SUT ETHILON 3 0 FSL (SUTURE) IMPLANT
SUT MNCRL AB 3-0 PS2 18 (SUTURE) ×5 IMPLANT
SUT MNCRL AB 4-0 PS2 18 (SUTURE) ×6 IMPLANT
SUT PROLENE 4 0 PS 2 18 (SUTURE) ×2 IMPLANT
SYR 20CC LL (SYRINGE) IMPLANT
SYR 50ML LL SCALE MARK (SYRINGE) ×1 IMPLANT
SYR BULB IRRIGATION 50ML (SYRINGE) ×6 IMPLANT
SYR CONTROL 10ML LL (SYRINGE) ×6 IMPLANT
SYR TB 1ML LL NO SAFETY (SYRINGE) ×1 IMPLANT
TAPE MEASURE VINYL STERILE (MISCELLANEOUS) ×3 IMPLANT
TOWEL OR 17X24 6PK STRL BLUE (TOWEL DISPOSABLE) ×9 IMPLANT
TOWEL OR NON WOVEN STRL DISP B (DISPOSABLE) ×3 IMPLANT
TRAY DSU PREP LF (CUSTOM PROCEDURE TRAY) ×3 IMPLANT
TRAY FOLEY BAG SILVER LF 14FR (SET/KITS/TRAYS/PACK) IMPLANT
TRAY FOLEY BAG SILVER LF 16FR (SET/KITS/TRAYS/PACK) ×3 IMPLANT
TUBE CONNECTING 20X1/4 (TUBING) ×3 IMPLANT
TUBING INFILTRATION IT-10001 (TUBING) IMPLANT
TUBING SET GRADUATE ASPIR 12FT (MISCELLANEOUS) IMPLANT
VAC PENCILS W/TUBING CLEAR (MISCELLANEOUS) ×3 IMPLANT
YANKAUER SUCT BULB TIP NO VENT (SUCTIONS) ×3 IMPLANT

## 2016-04-21 NOTE — Anesthesia Preprocedure Evaluation (Addendum)
Anesthesia Evaluation  Patient identified by MRN, date of birth, ID band Patient awake    Reviewed: Allergy & Precautions, NPO status , Patient's Chart, lab work & pertinent test results  Airway Mallampati: II  TM Distance: >3 FB Neck ROM: Full    Dental  (+) Teeth Intact   Pulmonary former smoker,    breath sounds clear to auscultation       Cardiovascular hypertension, Pt. on medications  Rhythm:Regular Rate:Normal     Neuro/Psych Anxiety    GI/Hepatic GERD  Medicated,  Endo/Other  Hypothyroidism   Renal/GU      Musculoskeletal  (+) Arthritis ,   Abdominal   Peds  Hematology  (+) anemia ,   Anesthesia Other Findings   Reproductive/Obstetrics                           Anesthesia Physical Anesthesia Plan  ASA: II  Anesthesia Plan: General   Post-op Pain Management:    Induction: Intravenous  Airway Management Planned: Oral ETT  Additional Equipment:   Intra-op Plan:   Post-operative Plan: Extubation in OR  Informed Consent: I have reviewed the patients History and Physical, chart, labs and discussed the procedure including the risks, benefits and alternatives for the proposed anesthesia with the patient or authorized representative who has indicated his/her understanding and acceptance.   Dental advisory given  Plan Discussed with:   Anesthesia Plan Comments:         Anesthesia Quick Evaluation

## 2016-04-21 NOTE — Transfer of Care (Signed)
Immediate Anesthesia Transfer of Care Note  Patient: Adriana Hicks  Procedure(s) Performed: Procedure(s): REMOVAL OF BREAST IMPLANTS WITH IMMEDIATE REPLACMENTOF BREAST IMPLANTS (Bilateral) MASTOPEXY (Right)  Patient Location: PACU  Anesthesia Type:General  Level of Consciousness: awake and patient cooperative  Airway & Oxygen Therapy: Patient Spontanous Breathing and Patient connected to face mask oxygen  Post-op Assessment: Report given to RN and Post -op Vital signs reviewed and stable  Post vital signs: Reviewed and stable  Last Vitals:  Vitals:   04/21/16 0657  BP: 133/67  Pulse: 64  Resp: 18  Temp: 36.5 C    Last Pain:  Vitals:   04/21/16 0657  TempSrc: Oral  PainSc: 0-No pain         Complications: No apparent anesthesia complications

## 2016-04-21 NOTE — H&P (Signed)
  H&P faxed to surgical center.  -History and Physical Reviewed  -Patient has been re-examined  -No change in the plan of care  Adriana Hicks    

## 2016-04-21 NOTE — Brief Op Note (Signed)
04/21/2016  12:42 PM  PATIENT:  Adriana Hicks  71 y.o. female  PRE-OPERATIVE DIAGNOSIS: 1) H/O LEFT BREAST CANCER 2) AGED BILATERAL BREAST IMPLANTS 3) BILATERAL IMPLANT CAPSULAR CONTRACTURES 4) RECURRENT RIGHT BREAST PTOSIS 5)LEFT BREAST PTOSIS  POST-OPERATIVE DIAGNOSIS:  1) H/O LEFT BREAST CANCER 2) AGED BILATERAL BREAST IMPLANTS 3) BILATERAL IMPLANT CAPSULAR CONTRACTURES 4) RECURRENT RIGHT BREAST PTOSIS 5) LEFT BREAST PTOSIS  PROCEDURE:  Procedure(s): REMOVAL OF BREAST IMPLANTS WITH IMMEDIATE REPLACMENTOF BREAST IMPLANTS (Bilateral) MASTOPEXY (Right) LEFT MASTOPEXY BILATERAL CAPSULECTOMIES  SURGEON:  Surgeon(s) and Role:    * Youlanda Roys, MD - Primary  ANESTHESIA:   general  EBL:  Total I/O In: 2900 [I.V.:2900] Out: 875 [Urine:875]  BLOOD ADMINISTERED:none  DRAINS: none   LOCAL MEDICATIONS USED:  1.3% Exparel (total 266 mgs.)  SPECIMEN:  Source of Specimen:  Left Breast  DISPOSITION OF SPECIMEN:  PATHOLOGY  COUNTS:  YES  DICTATION: .Other Dictation: Dictation Number [id number]  PLAN OF CARE: Discharge to home after PACU  PATIENT DISPOSITION:  PACU - hemodynamically stable.   Delay start of Pharmacological VTE agent (>24hrs) due to surgical blood loss or risk of bleeding: not applicable

## 2016-04-21 NOTE — Anesthesia Procedure Notes (Signed)
Procedure Name: Intubation Date/Time: 04/21/2016 7:49 AM Performed by: Demaryius Imran D Pre-anesthesia Checklist: Patient identified, Emergency Drugs available, Suction available and Patient being monitored Patient Re-evaluated:Patient Re-evaluated prior to inductionOxygen Delivery Method: Circle system utilized Preoxygenation: Pre-oxygenation with 100% oxygen Intubation Type: IV induction Ventilation: Mask ventilation without difficulty Laryngoscope Size: Mac and 3 Tube type: Oral Tube size: 7.0 mm Number of attempts: 3 Airway Equipment and Method: Stylet and Oral airway Placement Confirmation: ETT inserted through vocal cords under direct vision,  positive ETCO2 and breath sounds checked- equal and bilateral Secured at: 21 cm Tube secured with: Tape Dental Injury: Teeth and Oropharynx as per pre-operative assessment  Comments: Smooth IV induction, easy mask ventilation, DL x1 by T Luan Maberry arytenoids visualized (small oral cavity) unable to visualize VC unable to pass ETT, Dr Delia Chimes attempt x 1 unsuccessful, glide scope per Dr Jamal Maes with visualization of VC easy passage of ETT with BBS, +ETCO2, teeth unchanged

## 2016-04-21 NOTE — Discharge Instructions (Signed)
1. No lifting greater than 5 lbs with arms for 4 weeks. 2. Percocet 5/325 mg tabs 1-2 tabs po q 4-6 hours prn pain- prescription given in office. 3. Duricef 1 tab po bid- prescription given in office. 4. Follow-up appointment Friday in office.      Post Anesthesia Home Care Instructions  Activity: Get plenty of rest for the remainder of the day. A responsible adult should stay with you for 24 hours following the procedure.  For the next 24 hours, DO NOT: -Drive a car -Paediatric nurse -Drink alcoholic beverages -Take any medication unless instructed by your physician -Make any legal decisions or sign important papers.  Meals: Start with liquid foods such as gelatin or soup. Progress to regular foods as tolerated. Avoid greasy, spicy, heavy foods. If nausea and/or vomiting occur, drink only clear liquids until the nausea and/or vomiting subsides. Call your physician if vomiting continues.  Special Instructions/Symptoms: Your throat may feel dry or sore from the anesthesia or the breathing tube placed in your throat during surgery. If this causes discomfort, gargle with warm salt water. The discomfort should disappear within 24 hours.  If you had a scopolamine patch placed behind your ear for the management of post- operative nausea and/or vomiting:  1. The medication in the patch is effective for 72 hours, after which it should be removed.  Wrap patch in a tissue and discard in the trash. Wash hands thoroughly with soap and water. 2. You may remove the patch earlier than 72 hours if you experience unpleasant side effects which may include dry mouth, dizziness or visual disturbances. 3. Avoid touching the patch. Wash your hands with soap and water after contact with the patch.

## 2016-04-22 ENCOUNTER — Encounter (HOSPITAL_BASED_OUTPATIENT_CLINIC_OR_DEPARTMENT_OTHER): Payer: Self-pay | Admitting: Plastic Surgery

## 2016-04-23 NOTE — Anesthesia Postprocedure Evaluation (Addendum)
Anesthesia Post Note  Patient: Adriana Hicks  Procedure(s) Performed: Procedure(s) (LRB): REMOVAL OF BREAST IMPLANTS WITH IMMEDIATE REPLACMENTOF BREAST IMPLANTS (Bilateral) MASTOPEXY (Right)  Patient location during evaluation: PACU Anesthesia Type: General Level of consciousness: awake and alert Pain management: pain level controlled Vital Signs Assessment: post-procedure vital signs reviewed and stable Respiratory status: spontaneous breathing, nonlabored ventilation, respiratory function stable and patient connected to nasal cannula oxygen Cardiovascular status: blood pressure returned to baseline and stable Postop Assessment: no signs of nausea or vomiting Anesthetic complications: no       Last Vitals:  Vitals:   04/21/16 1415 04/21/16 1434  BP: (!) 149/65 (!) 149/71  Pulse: 65 75  Resp: (!) 8   Temp:  36.7 C    Last Pain:  Vitals:   04/22/16 1027  TempSrc:   PainSc: 2                  Eiko Mcgowen,JAMES TERRILL

## 2016-05-28 NOTE — Brief Op Note (Signed)
04/21/2016  7:56 AM  PATIENT:  Adriana Hicks  71 y.o. female  PRE-OPERATIVE DIAGNOSIS: 1) H/O LEFT  BREAST CANCER 2) BILATERAL BREAST IMPLANT CAPSULAR CONTRACTURES 3) RECURRENT PTOSIS BILATERAL BREASTS  POST-OPERATIVE DIAGNOSIS: 1) H/O LEFT BREAST CANCER 2) BILATERAL BREAST IMPLANT CAPSULAR CONTRACTURES 3) RECURRENT PTOSIS BILATERAL BREASTS  PROCEDURE:  1) REMOVAL OF BILATERAL BREAST IMPLANTS WITH IMMEDIATE REPLACEMENT 2) BILATERAL BREAST IMPLANT CAPSULECTOMIES 3) REVISIONAL RIGHT BREAST PERI-AREOLAR MASTOPEXY 4) REVISIONAL LEFT BREAST MASTOPEXY  SURGEON:  Surgeon(s) and Role:    Youlanda Roys, MD - Primary   ANESTHESIA:   general  EBL:  No intake/output data recorded.  BLOOD ADMINISTERED:none  DRAINS: none   LOCAL MEDICATIONS USED:  1.3% Exparel (total 266 mgs)  SPECIMEN:  Source of Specimen:  Left Breast  DISPOSITION OF SPECIMEN:  PATHOLOGY  COUNTS:  YES  DICTATION:  OP NOTE #[id number]  PLAN OF CARE: Discharge to home after PACU  PATIENT DISPOSITION:  PACU - hemodynamically stable.   Delay start of Pharmacological VTE agent (>24hrs) due to surgical blood loss or risk of bleeding: not applicable

## 2016-05-28 NOTE — Op Note (Signed)
NAMEDESARAI, BARRACK               ACCOUNT NO.:  [id number]  MEDICAL RECORD NO.:  [id number]  LOCATION:                                 FACILITY:  PHYSICIAN:  Hetty Blend, M.D.DATE OF BIRTH:  [birth year]  DATE OF PROCEDURE:  04/21/2016 DATE OF DISCHARGE:                              OPERATIVE REPORT   PREOPERATIVE DIAGNOSES: 1. History of left breast cancer. 2. S/P Tram Flap Reconstruction Left Breast 3. S/p Mastopexy Augmentation Right Breast for Symmetry Procedure 4. Bilateral breast implant capsular contractures. 5. Recurrent ptosis of bilateral breasts.  POSTOPERATIVE DIAGNOSES: 1. History of Left Breast Cancer. 2/ S/P Tram Flap Reconstruction Left Breast 3. S/P Mastopexy Augmentation Right Breast for Symmetry Procedure 4. Bilateral Breast Implant Capsular Contractures. 5. Recurrent Ptosis of Bilateral Breasts.  PROCEDURE: 1. Removal of Bilateral Breast Implants with Immediate Replacement of     Breast Implants. 2. Bilateral Breast Implant Capsulectomies. 3. Revisional Right Breast Periareolar Mastopexy. 4. Revisional Left Breast Mastopexy.  SURGEON:  Dr. Audrea Muscat Tom Ragsdale.  ANESTHESIA:  General.  COMPLICATIONS:  None.  IMPLANTS USED:  Right breast is Mentor style #1000 smooth 400 mL moderate plus profile round smooth silicone gel implant, reference #[id number], lot #[id number]; left breast, Mentor style 1000 smooth 325 mL round smooth moderate plus profile silicone gel breast implant, reference #[id number], lot #[id number].  INDICATIONS FOR THE PROCEDURE:  The patient is a 71 year old Caucasian female, who had presented for evaluation of her breasts as she has a history of left breast cancer diagnosed in 1996.  She underwent a left mastectomy followed by breast reconstruction using a TRAM flap.  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 She also had an axillary node dissection.  At that time, she was reconstructed with a  left TRAM flap.  She also had undergone a symmetry procedure of the right breast with a mastopexy augmentation.  However, over time, she has had other issues that developed with her breasts, but she now presents because she has bilateral breast implant capsule contractures.  At one point, the patient needed to have a breast implant placed underneath her left TRAM flap to provide better symmetry for her breasts with the different reconstructions.  She also developed recurrent ptosis of both breasts.  She presents to undergo removal and replacement of the breast implants with bilateral breast implant capsulectomies to treat the capsular contractures and revisional mastopexies to treat the recurrent breast ptosis.  DESCRIPTION OF PROCEDURE:  The patient was brought into the operating room and taken to the OR.  She was placed on the table in the supine position.  After adequate general anesthesia was obtained, the patient's chest was prepped with Techni-Care and draped in a sterile fashion.  The bases of both breasts had been injected with 1% lidocaine with epinephrine.  After adequate hemostasis and anesthesia had been obtained, the procedure was begun.  It should be noted that the patient had been marked for the mastopexies in the preop holding area, and of course, we may need to revise them on the table as well.  I first proceeded to remove both of the breast implants.  She had previous  inframammary crease incisions, which were used for access.  The breast implants were removed.  I then performed bilateral capsulectomies using the Bovie electrocautery.  At that point, I placed implant sizers into the subpectoral pocket on the right side and beneath the TRAM flap on the left side.  A volume of 400 mL on the right with a corresponding volume of 325 mL on the left appeared to give her good symmetry in her size.  I then checked my markings for the bilateral mastopexies with the patient in the  upright position.  I did make minor adjustments to those markings.  She was then placed back in the recumbent position for completion of the mastopexies.  I first performed the right periareolar mastopexy.  The skin that had been marked was excised.  I preserved all of the breast tissue for the patient.  Using a 3-0 Gore-Tex suture, a periareolar stitch was placed beneath the dermis.  This allowed me to decrease the size of her nipple- areolar complex and provide the lift that was necessary.  The nipple- areolar complex was then examined, found to be pink and viable, then brought out through this aperture.  It was sutured in place using 4-0 Monocryl in the dermal layer, followed by a 4-0 Monocryl in an intracuticular stitch on the skin.  I then turned my attention to the left breast.  The entire upper two-thirds of the breast had to be elevated and so the mastopexy was performed around the TRAM flap.  The marked skin and soft tissue were sharply excised.  Again, most of the breast tissue was preserved.  The incision was then closed using 3-0 Monocryl in the dermal layer, followed by a 4-0 Monocryl running intracuticular stitch on the skin.  I did tailor part of the mastopexy laterally as well to help treat that area.  She did have some redundancy there and if any persists postoperatively, we may at some point need to come back to perform liposuction.  At this point, the implant sizers were removed.  The implant pockets were then irrigated first with saline followed by antibiotic irrigation and then dilute Betadine irrigation. 1.3% Exparel (total 266 mg) was then infiltrated into the bilateral breast tissue as well as chest musculature to provide postoperative pain control for the patient.  Using the No-Touch technique, a 400 mL round smooth moderate plus profile silicone gel implant (Mentor) was placed into the right subpectoral pocket.  Then, a 325 mL round smooth moderate plus profile  silicone gel implant (Mentor) was placed into the left implant pocket beneath the TRAM flap.  She was once again placed in the upright position.  This now gave her nice breast symmetry as well as the recurrent ptosis had been properly treated.  She was then placed back in the recumbent position.  It should be noted that the TRAM flap tissues were pink and viable.  I was careful of course to not perform a dissection in the area of the pedicle for the TRAM flap.  The inframammary crease incisions were then sutured using 3-0 Monocryl to close the breast fascial layer followed by 3-0 Monocryl on the dermal layer and a 4-0 Monocryl running intracuticular stitch on the skin.  The inframammary crease incisions were then dressed with Steri-Strips.  The mastopexy incisions were dressed with bacitracin ointment, Xeroform, 4 x 4 gauze, and Hypafix tape.  She was placed in light postoperative support bra.  There were no complications.  The patient tolerated the procedure  well.  The final needle and sponge counts were reported to be correct at the end of the procedure.   The patient was then extubated and taken to the recovery room in stable condition.  She was also recovered without complications.  Both the patient and her husband were given proper postoperative wound care instructions.  She was then discharged home in the care of her husband in stable condition.  Followup appointment will be within a few days in the office.          ______________________________ Hetty Blend, M.D.     MC/MEDQ  D:  05/28/2016  T:  05/28/2016  Job:  [id number]

## 2016-06-25 ENCOUNTER — Other Ambulatory Visit: Payer: Self-pay | Admitting: Family Medicine

## 2016-06-25 NOTE — Telephone Encounter (Signed)
Needs appointment for any more refills hasn't been seen since 04/2015 or had her TSH checked in >1 year

## 2016-06-25 NOTE — Telephone Encounter (Signed)
I called patient and advised her as below. Patient states that she has switched PCP and is no longer a patient at [facility]. Patient states this request must have been sent to our office in error. I called pharmacy and cancelled refill that was sent in since patient is no longer a patient here.

## 2016-06-26 DIAGNOSIS — R197 Diarrhea, unspecified: Secondary | ICD-10-CM | POA: Insufficient documentation

## 2016-07-08 ENCOUNTER — Other Ambulatory Visit
Admission: RE | Admit: 2016-07-08 | Discharge: 2016-07-08 | Disposition: A | Discharge status: Home / Self Care | Payer: Federal, State, Local not specified - PPO | Source: Ambulatory Visit | Attending: Internal Medicine | Admitting: Internal Medicine

## 2016-07-08 DIAGNOSIS — R197 Diarrhea, unspecified: Secondary | ICD-10-CM | POA: Diagnosis not present

## 2016-07-08 LAB — GASTROINTESTINAL PANEL BY PCR, STOOL (REPLACES STOOL CULTURE)
Adenovirus F40/41: NOT DETECTED
Astrovirus: NOT DETECTED
CAMPYLOBACTER SPECIES: NOT DETECTED
Cryptosporidium: NOT DETECTED
Cyclospora cayetanensis: NOT DETECTED
ENTEROAGGREGATIVE E COLI (EAEC): NOT DETECTED
Entamoeba histolytica: NOT DETECTED
Enteropathogenic E coli (EPEC): NOT DETECTED
Enterotoxigenic E coli (ETEC): NOT DETECTED
GIARDIA LAMBLIA: NOT DETECTED
NOROVIRUS GI/GII: NOT DETECTED
PLESIMONAS SHIGELLOIDES: NOT DETECTED
ROTAVIRUS A: NOT DETECTED
SALMONELLA SPECIES: NOT DETECTED
SHIGA LIKE TOXIN PRODUCING E COLI (STEC): NOT DETECTED
SHIGELLA/ENTEROINVASIVE E COLI (EIEC): NOT DETECTED
Sapovirus (I, II, IV, and V): NOT DETECTED
Vibrio cholerae: NOT DETECTED
Vibrio species: NOT DETECTED
Yersinia enterocolitica: NOT DETECTED

## 2016-07-10 ENCOUNTER — Other Ambulatory Visit: Payer: Self-pay

## 2016-07-10 ENCOUNTER — Telehealth: Payer: Self-pay

## 2016-07-10 DIAGNOSIS — K56600 Partial intestinal obstruction, unspecified as to cause: Secondary | ICD-10-CM

## 2016-07-10 MED ORDER — NA SULFATE-K SULFATE-MG SULF 17.5-3.13-1.6 GM/177ML PO SOLN: 1.0000 | ORAL | 0 refills | Status: DC

## 2016-07-10 NOTE — Telephone Encounter (Signed)
Gastroenterology Pre-Procedure Review  Request Date:  Requesting Physician: Dr.   PATIENT REVIEW QUESTIONS: The patient responded to the following health history questions as indicated:    1. Are you having any GI issues? no 2. Do you have a personal history of Polyps? no 3. Do you have a family history of Colon Cancer or Polyps? no 4. Diabetes Mellitus? no 5. Joint replacements in the past 12 months?no 6. Major health problems in the past 3 months?no 7. Any artificial heart valves, MVP, or defibrillator?no    MEDICATIONS & ALLERGIES:    Patient reports the following regarding taking any anticoagulation/antiplatelet therapy:   Plavix, Coumadin, Eliquis, Xarelto, Lovenox, Pradaxa, Brilinta, or Effient? no Aspirin? no  Patient confirms/reports the following medications:  Current Outpatient Prescriptions  Medication Sig Dispense Refill  . ALPRAZolam (XANAX) 0.5 MG tablet Take 0.5 mg by mouth at bedtime as needed for anxiety.    Marland Kitchen amphetamine-dextroamphetamine (ADDERALL) 20 MG tablet Take 20 mg by mouth daily.    . Ascorbic Acid (VITAMIN C PO) Take by mouth.    . cholecalciferol (VITAMIN D) 1000 UNITS tablet Take 1,000 Units by mouth daily.    . cyanocobalamin (,VITAMIN B-12,) 1000 MCG/ML injection USE 1 ML PER INJECTION, IM WEEKLY 10 mL 3  . doxepin (SINEQUAN) 10 MG capsule TAKE 1 CAPSULE (10 MG TOTAL) BY MOUTH AT BEDTIME. 90 capsule 1  . ferrous sulfate 324 (65 FE) MG TBEC Take by mouth as needed. Reported on 06/17/2015    . levothyroxine (SYNTHROID, LEVOTHROID) 75 MCG tablet TAKE 1 TABLET BY MOUTH DAILY 30 tablet 0  . losartan (COZAAR) 25 MG tablet TAKE 1 TABLET (25 MG TOTAL) BY MOUTH DAILY. 90 tablet 1  . Multiple Vitamins-Minerals (MULTIVITAMIN PO) Take by mouth.    . Probiotic Product (PROBIOTIC PO) Take by mouth daily.    . ranitidine (ZANTAC) 150 MG capsule Take 150 mg by mouth 2 (two) times daily.    . S-Adenosylmethionine (SAM-E PO) Take by mouth daily.    . valACYclovir  (VALTREX) 500 MG tablet TAKE 1 TABLET BY MOUTH DAILY AND INCREASE TO 2 TABLETS DAILY X 3 DAYS AT ONSET OF OUTBREAK 45 tablet 12  . YUVAFEM 10 MCG TABS vaginal tablet PLACE 1 TABLET (10 MCG TOTAL) VAGINALLY 2 (TWO) TIMES A WEEK. 24 tablet 3   No current facility-administered medications for this visit.     Patient confirms/reports the following allergies:  Allergies  Allergen Reactions  . Sulfamethoxazole Other (See Comments)    Vaginal itching     No orders of the defined types were placed in this encounter.   AUTHORIZATION INFORMATION Primary Insurance: 1D#: Group #:  Secondary Insurance: 1D#: Group #:  SCHEDULE INFORMATION: Date: 07/16/16 Time: Location: [place]

## 2016-07-13 ENCOUNTER — Telehealth: Payer: Self-pay | Admitting: Gastroenterology

## 2016-07-13 ENCOUNTER — Encounter: Payer: Self-pay | Admitting: *Deleted

## 2016-07-13 NOTE — Discharge Instructions (Signed)

## 2016-07-13 NOTE — Telephone Encounter (Signed)
07/13/16 Spoke with Hall Busing and NO auth required for Colonoscopy [phone] / [phone]

## 2016-07-16 ENCOUNTER — Encounter
Admission: RE | Disposition: A | Discharge status: Home / Self Care | Payer: Self-pay | Source: Ambulatory Visit | Attending: Gastroenterology

## 2016-07-16 ENCOUNTER — Ambulatory Visit
Admission: RE | Admit: 2016-07-16 | Discharge: 2016-07-16 | Disposition: A | Discharge status: Home / Self Care | Payer: Federal, State, Local not specified - PPO | Source: Ambulatory Visit | Attending: Gastroenterology | Admitting: Gastroenterology

## 2016-07-16 ENCOUNTER — Ambulatory Visit: Payer: Federal, State, Local not specified - PPO | Admitting: Anesthesiology

## 2016-07-16 DIAGNOSIS — E039 Hypothyroidism, unspecified: Secondary | ICD-10-CM | POA: Insufficient documentation

## 2016-07-16 DIAGNOSIS — Z79899 Other long term (current) drug therapy: Secondary | ICD-10-CM | POA: Diagnosis not present

## 2016-07-16 DIAGNOSIS — K529 Noninfective gastroenteritis and colitis, unspecified: Secondary | ICD-10-CM | POA: Diagnosis present

## 2016-07-16 DIAGNOSIS — K641 Second degree hemorrhoids: Secondary | ICD-10-CM | POA: Diagnosis not present

## 2016-07-16 DIAGNOSIS — K219 Gastro-esophageal reflux disease without esophagitis: Secondary | ICD-10-CM | POA: Insufficient documentation

## 2016-07-16 DIAGNOSIS — Z87891 Personal history of nicotine dependence: Secondary | ICD-10-CM | POA: Diagnosis not present

## 2016-07-16 DIAGNOSIS — D649 Anemia, unspecified: Secondary | ICD-10-CM | POA: Insufficient documentation

## 2016-07-16 DIAGNOSIS — E785 Hyperlipidemia, unspecified: Secondary | ICD-10-CM | POA: Insufficient documentation

## 2016-07-16 DIAGNOSIS — Z853 Personal history of malignant neoplasm of breast: Secondary | ICD-10-CM | POA: Diagnosis not present

## 2016-07-16 DIAGNOSIS — M1990 Unspecified osteoarthritis, unspecified site: Secondary | ICD-10-CM | POA: Insufficient documentation

## 2016-07-16 DIAGNOSIS — F909 Attention-deficit hyperactivity disorder, unspecified type: Secondary | ICD-10-CM | POA: Insufficient documentation

## 2016-07-16 DIAGNOSIS — I10 Essential (primary) hypertension: Secondary | ICD-10-CM | POA: Diagnosis not present

## 2016-07-16 HISTORY — PX: COLONOSCOPY WITH PROPOFOL: SHX5780

## 2016-07-16 HISTORY — DX: Seborrheic dermatitis, unspecified: L21.9

## 2016-07-16 SURGERY — COLONOSCOPY WITH PROPOFOL
Anesthesia: Monitor Anesthesia Care | Wound class: Contaminated

## 2016-07-16 MED ORDER — LACTATED RINGERS IV SOLN
INTRAVENOUS | Status: DC | PRN
  Administered 2016-07-16: 08:00:00 via INTRAVENOUS

## 2016-07-16 MED ORDER — PROPOFOL 10 MG/ML IV BOLUS
INTRAVENOUS | Status: DC | PRN
  Administered 2016-07-16 (×5): 50 mg via INTRAVENOUS
  Administered 2016-07-16: 100 mg via INTRAVENOUS

## 2016-07-16 MED ORDER — LIDOCAINE HCL (CARDIAC) 20 MG/ML IV SOLN
INTRAVENOUS | Status: DC | PRN
  Administered 2016-07-16: 50 mg via INTRAVENOUS

## 2016-07-16 MED ORDER — STERILE WATER FOR IRRIGATION IR SOLN
Status: DC | PRN
  Administered 2016-07-16: 09:00:00

## 2016-07-16 SURGICAL SUPPLY — 23 items
CANISTER SUCT 1200ML W/VALVE (MISCELLANEOUS) ×2 IMPLANT
CLIP HMST 235XBRD CATH ROT (MISCELLANEOUS) IMPLANT
CLIP RESOLUTION 360 11X235 (MISCELLANEOUS)
FCP ESCP3.2XJMB 240X2.8X (MISCELLANEOUS)
FORCEPS BIOP RAD 4 LRG CAP 4 (CUTTING FORCEPS) ×1 IMPLANT
FORCEPS BIOP RJ4 240 W/NDL (MISCELLANEOUS)
FORCEPS ESCP3.2XJMB 240X2.8X (MISCELLANEOUS) IMPLANT
GOWN CVR UNV OPN BCK APRN NK (MISCELLANEOUS) ×2 IMPLANT
GOWN ISOL THUMB LOOP REG UNIV (MISCELLANEOUS) ×4
INJECTOR VARIJECT VIN23 (MISCELLANEOUS) IMPLANT
KIT DEFENDO VALVE AND CONN (KITS) IMPLANT
KIT ENDO PROCEDURE OLY (KITS) ×2 IMPLANT
MARKER SPOT ENDO TATTOO 5ML (MISCELLANEOUS) IMPLANT
PAD GROUND ADULT SPLIT (MISCELLANEOUS) IMPLANT
PROBE APC STR FIRE (PROBE) IMPLANT
RETRIEVER NET ROTH 2.5X230 LF (MISCELLANEOUS) IMPLANT
SNARE SHORT THROW 13M SML OVAL (MISCELLANEOUS) IMPLANT
SNARE SHORT THROW 30M LRG OVAL (MISCELLANEOUS) IMPLANT
SNARE SNG USE RND 15MM (INSTRUMENTS) IMPLANT
SPOT EX ENDOSCOPIC TATTOO (MISCELLANEOUS)
TRAP ETRAP POLY (MISCELLANEOUS) IMPLANT
VARIJECT INJECTOR VIN23 (MISCELLANEOUS)
WATER STERILE IRR 250ML POUR (IV SOLUTION) ×2 IMPLANT

## 2016-07-16 NOTE — Transfer of Care (Signed)
Immediate Anesthesia Transfer of Care Note  Patient: Verlia Kaney Adsit  Procedure(s) Performed: Procedure(s) with comments: COLONOSCOPY WITH PROPOFOL (N/A) - requests early as possible  Patient Location: PACU  Anesthesia Type: MAC  Level of Consciousness: awake, alert  and patient cooperative  Airway and Oxygen Therapy: Patient Spontanous Breathing and Patient connected to supplemental oxygen  Post-op Assessment: Post-op Vital signs reviewed, Patient's Cardiovascular Status Stable, Respiratory Function Stable, Patent Airway and No signs of Nausea or vomiting  Post-op Vital Signs: Reviewed and stable  Complications: No apparent anesthesia complications

## 2016-07-16 NOTE — Op Note (Signed)
[facility] Gastroenterology Patient Name: Adriana Hicks Procedure Date: 07/16/2016 8:18 AM MRN: [id number] Account #: [id number] Date of Birth: 04-07-1945 Admit Type: Outpatient Age: 71 Room: [facility] OR ROOM 01 Gender: Female Note Status: Finalized Procedure:            Colonoscopy Indications:          Chronic diarrhea Providers:            Lucilla Lame MD, MD Referring MD:         Ramonita Lab, MD (Referring MD) Medicines:            Propofol per Anesthesia Complications:        No immediate complications. Procedure:            Pre-Anesthesia Assessment:                       - Prior to the procedure, a History and Physical was                        performed, and patient medications and allergies were                        reviewed. The patient's tolerance of previous                        anesthesia was also reviewed. The risks and benefits of                        the procedure and the sedation options and risks were                        discussed with the patient. All questions were                        answered, and informed consent was obtained. Prior                        Anticoagulants: The patient has taken no previous                        anticoagulant or antiplatelet agents. ASA Grade                        Assessment: II - A patient with mild systemic disease.                        After reviewing the risks and benefits, the patient was                        deemed in satisfactory condition to undergo the                        procedure.                       After obtaining informed consent, the colonoscope was                        passed under direct vision. Throughout the procedure,  the patient's blood pressure, pulse, and oxygen                        saturations were monitored continuously. The Olympus                        PCF H180AL Colonoscope (S#: [id number]) was introduced                        through the  anus and advanced to the the terminal                        ileum. The colonoscopy was performed without                        difficulty. The patient tolerated the procedure well.                        The quality of the bowel preparation was excellent. Findings:      The perianal and digital rectal examinations were normal.      The terminal ileum appeared normal. Biopsies were taken with a cold       forceps for histology.      Patchy mild inflammation characterized by erythema was found in the       sigmoid colon. Biopsies were taken with a cold forceps for histology.      Non-bleeding internal hemorrhoids were found during retroflexion. The       hemorrhoids were Grade II (internal hemorrhoids that prolapse but reduce       spontaneously).      Random biopsies were obtained with cold forceps for histology randomly       in the entire colon. Impression:           - The examined portion of the ileum was normal.                        Biopsied.                       - Patchy mild inflammation was found in the sigmoid                        colon secondary to colitis. Biopsied.                       - Non-bleeding internal hemorrhoids.                       - Random biopsies were obtained in the entire colon. Recommendation:       - Discharge patient to home.                       - Resume previous diet.                       - Continue present medications.                       - Await pathology results. Procedure Code(s):    --- Professional ---  45380, Colonoscopy, flexible; with biopsy, single or                        multiple Diagnosis Code(s):    --- Professional ---                       K52.9, Noninfective gastroenteritis and colitis,                        unspecified CPT copyright 2016 American Medical Association. All rights reserved. The codes documented in this report are preliminary and upon coder review may  be revised to meet current  compliance requirements. Lucilla Lame MD, MD 07/16/2016 8:49:48 AM This report has been signed electronically. Number of Addenda: 0 Note Initiated On: 07/16/2016 8:18 AM Scope Withdrawal Time: 0 hours 6 minutes 25 seconds  Total Procedure Duration: 0 hours 15 minutes 54 seconds       [facility]

## 2016-07-16 NOTE — Anesthesia Postprocedure Evaluation (Signed)
Anesthesia Post Note  Patient: Adriana Hicks  Procedure(s) Performed: Procedure(s) (LRB): COLONOSCOPY WITH PROPOFOL (N/A)  Patient location during evaluation: PACU Anesthesia Type: MAC Level of consciousness: awake and alert and oriented Pain management: satisfactory to patient Vital Signs Assessment: post-procedure vital signs reviewed and stable Respiratory status: spontaneous breathing, nonlabored ventilation and respiratory function stable Cardiovascular status: blood pressure returned to baseline and stable Postop Assessment: Adequate PO intake and No signs of nausea or vomiting Anesthetic complications: no    Raliegh Ip

## 2016-07-16 NOTE — Anesthesia Preprocedure Evaluation (Signed)
Anesthesia Evaluation  Patient identified by MRN, date of birth, ID band Patient awake    Reviewed: Allergy & Precautions, H&P , NPO status , Patient's Chart, lab work & pertinent test results  Airway Mallampati: II  TM Distance: >3 FB Neck ROM: full    Dental no notable dental hx.    Pulmonary former smoker,    Pulmonary exam normal        Cardiovascular hypertension, Normal cardiovascular exam     Neuro/Psych PSYCHIATRIC DISORDERS    GI/Hepatic GERD  ,  Endo/Other  Hypothyroidism   Renal/GU      Musculoskeletal   Abdominal   Peds  Hematology   Anesthesia Other Findings   Reproductive/Obstetrics                             Anesthesia Physical Anesthesia Plan  ASA: II  Anesthesia Plan: MAC   Post-op Pain Management:    Induction:   Airway Management Planned:   Additional Equipment:   Intra-op Plan:   Post-operative Plan:   Informed Consent: I have reviewed the patients History and Physical, chart, labs and discussed the procedure including the risks, benefits and alternatives for the proposed anesthesia with the patient or authorized representative who has indicated his/her understanding and acceptance.     Plan Discussed with:   Anesthesia Plan Comments:         Anesthesia Quick Evaluation

## 2016-07-16 NOTE — H&P (Signed)
Lucilla Lame, MD [address] [facility] [place], [place] [address] [phone] Fax : [phone]  Primary Care Physician:  Adin Hector, MD Primary Gastroenterologist:  Dr. Allen Norris  Pre-Procedure History & Physical: HPI:  Adriana Hicks is a 71 y.o. female is here for an colonoscopy.   Past Medical History:  Diagnosis Date  . Abnormal Pap smear of cervix 3/15   ASCUS HPV not detected  . ADD (attention deficit disorder)   . ADD (attention deficit disorder)   . Anemia   . Arthritis    fingers  . Breast cancer ([place])    Left; Age 73  . Genital herpes 03/26/2009  . GERD (gastroesophageal reflux disease)   . History of chest pain    Related to reflux  . HSV-2 (herpes simplex virus 2) infection   . Hyperlipidemia   . Hypertension   . Hypothyroidism   . Personal history of tobacco use, presenting hazards to health 06/26/2015  . S/P appendectomy 1981  . Seborrheic dermatitis   . Vertigo    x1 - approx 5 yrs ago    Past Surgical History:  Procedure Laterality Date  . APPENDECTOMY    . AUGMENTATION MAMMAPLASTY  1982  . BREAST IMPLANT EXCHANGE Bilateral 04/21/2016   Procedure: REMOVAL OF BREAST IMPLANTS WITH IMMEDIATE REPLACMENTOF BREAST IMPLANTS;  Surgeon: Youlanda Roys, MD;  Location: [facility];  Service: Plastics;  Laterality: Bilateral;  . BREAST SURGERY  2005   Breast Implant replaceddue to leaking  . BREAST SURGERY Left 2006   due to asymmetry  . CARDIAC CATHETERIZATION     2011 at [facility] - reflux related  . CERVICAL BIOPSY  W/ LOOP ELECTRODE EXCISION     unsure pathology   . CESAREAN SECTION  1985  . COLONOSCOPY WITH PROPOFOL N/A 06/17/2015   Procedure: COLONOSCOPY WITH PROPOFOL;  Surgeon: Lucilla Lame, MD;  Location: [place];  Service: Endoscopy;  Laterality: N/A;  PT PREFERS EARLY AM  . DILATION AND CURETTAGE OF UTERUS    . ENDOMETRIAL BIOPSY    . MASTECTOMY Left    new implant on the Right  . MASTOPEXY  Right 04/21/2016   Procedure: MASTOPEXY;  Surgeon: Youlanda Roys, MD;  Location: [place];  Service: Plastics;  Laterality: Right;  . POLYPECTOMY  06/17/2015   Procedure: POLYPECTOMY;  Surgeon: Lucilla Lame, MD;  Location: [place];  Service: Endoscopy;;  . TONSILLECTOMY  child  . TUBAL LIGATION      Prior to Admission medications   Medication Sig Start Date End Date Taking? Authorizing Provider  amphetamine-dextroamphetamine (ADDERALL) 20 MG tablet Take 20 mg by mouth daily.   Yes Historical Provider, MD  Ascorbic Acid (VITAMIN C PO) Take by mouth.   Yes Historical Provider, MD  azelastine (ASTELIN) 0.1 % nasal spray Place into both nostrils 2 (two) times daily as needed for rhinitis. Use in each nostril as directed   Yes Historical Provider, MD  bismuth subsalicylate (PEPTO BISMOL) 262 MG/15ML suspension Take 30 mLs by mouth every 6 (six) hours as needed.   Yes Historical Provider, MD  cholecalciferol (VITAMIN D) 1000 UNITS tablet Take 1,000 Units by mouth daily.   Yes Historical Provider, MD  Clobetasol Propionate (CLODAN) 0.05 % shampoo Apply topically daily.   Yes Historical Provider, MD  cyanocobalamin (,VITAMIN B-12,) 1000 MCG/ML injection USE 1 ML PER INJECTION, IM WEEKLY 05/28/15  Yes Margarita Rana, MD  doxepin (SINEQUAN) 10 MG capsule TAKE 1  CAPSULE (10 MG TOTAL) BY MOUTH AT BEDTIME. 03/06/16  Yes Clearnce Sorrel Burnette, PA-C  ferrous sulfate 324 (65 FE) MG TBEC Take by mouth as needed. Reported on 06/17/2015   Yes Historical Provider, MD  ketoconazole (NIZORAL) 2 % shampoo Apply 1 application topically 2 (two) times a week.   Yes Historical Provider, MD  levothyroxine (SYNTHROID, LEVOTHROID) 75 MCG tablet TAKE 1 TABLET BY MOUTH DAILY 06/25/16  Yes Adriana M Pollak, PA-C  losartan (COZAAR) 25 MG tablet TAKE 1 TABLET (25 MG TOTAL) BY MOUTH DAILY. 03/06/16  Yes Jennifer M Burnette, PA-C  Luliconazole (LUZU) 1 % CREA Apply topically.   Yes Historical Provider, MD   Melatonin 5 MG TABS Take by mouth at bedtime as needed.   Yes Historical Provider, MD  mometasone (ELOCON) 0.1 % lotion Apply topically daily.   Yes Historical Provider, MD  Multiple Vitamins-Minerals (MULTIVITAMIN PO) Take by mouth.   Yes Historical Provider, MD  Probiotic Product (PROBIOTIC PO) Take by mouth daily.   Yes Historical Provider, MD  psyllium (METAMUCIL) 58.6 % packet Take 1 packet by mouth daily.   Yes Historical Provider, MD  ranitidine (ZANTAC) 150 MG capsule Take 150 mg by mouth 2 (two) times daily.   Yes Historical Provider, MD  S-Adenosylmethionine (SAM-E PO) Take by mouth daily.   Yes Historical Provider, MD  valACYclovir (VALTREX) 500 MG tablet TAKE 1 TABLET BY MOUTH DAILY AND INCREASE TO 2 TABLETS DAILY X 3 DAYS AT ONSET OF OUTBREAK 09/17/15  Yes Regina Eck, CNM  YUVAFEM 10 MCG TABS vaginal tablet PLACE 1 TABLET (10 MCG TOTAL) VAGINALLY 2 (TWO) TIMES A WEEK. 09/19/15  Yes Regina Eck, CNM  Na Sulfate-K Sulfate-Mg Sulf (SUPREP BOWEL PREP KIT) 17.5-3.13-1.6 GM/180ML SOLN Take 1 kit by mouth as directed. 07/10/16   Lucilla Lame, MD    Allergies as of 07/10/2016 - Review Complete 04/21/2016  Allergen Reaction Noted  . Sulfamethoxazole Other (See Comments) 05/17/2015    Family History  Problem Relation Age of Onset  . Breast cancer Mother 12  . Heart disease Father   . Skin cancer Brother   . Crohn's disease Brother     Social History   Social History  . Marital status: Married    Spouse name: N/A  . Number of children: N/A  . Years of education: N/A   Occupational History  . Not on file.   Social History Main Topics  . Smoking status: Former Smoker    Packs/day: 1.00    Years: 35.00    Types: Cigarettes    Quit date: 03/22/2004  . Smokeless tobacco: Never Used  . Alcohol use 4.2 oz/week    7 Standard drinks or equivalent per week     Comment: Drinks a glass of wine daily  . Drug use: No  . Sexual activity: Yes    Partners: Male    Birth  control/ protection: Surgical     Comment: btl   Other Topics Concern  . Not on file   Social History Narrative  . No narrative on file    Review of Systems: See HPI, otherwise negative ROS  Physical Exam: BP 119/64   Pulse 66   Temp 98.8 F (37.1 C) (Temporal)   Ht '5\' 3"'$  (1.6 m)   Wt 135 lb (61.2 kg)   LMP 03/23/1994   SpO2 100%   BMI 23.91 kg/m  General:   Alert,  pleasant and cooperative in NAD Head:  Normocephalic and atraumatic. Neck:  Supple; no masses or thyromegaly. Lungs:  Clear throughout to auscultation.    Heart:  Regular rate and rhythm. Abdomen:  Soft, nontender and nondistended. Normal bowel sounds, without guarding, and without rebound.   Neurologic:  Alert and  oriented x4;  grossly normal neurologically.  Impression/Plan: Adriana Hicks is here for an colonoscopy to be performed for diarrhea  Risks, benefits, limitations, and alternatives regarding  colonoscopy have been reviewed with the patient.  Questions have been answered.  All parties agreeable.   Lucilla Lame, MD  07/16/2016, 7:59 AM

## 2016-07-16 NOTE — Anesthesia Procedure Notes (Signed)
Procedure Name: MAC Date/Time: 07/16/2016 8:27 AM Performed by: Cameron Ali Pre-anesthesia Checklist: Patient identified, Emergency Drugs available, Suction available, Timeout performed and Patient being monitored Patient Re-evaluated:Patient Re-evaluated prior to inductionOxygen Delivery Method: Nasal cannula Placement Confirmation: positive ETCO2

## 2016-07-17 ENCOUNTER — Encounter: Payer: Self-pay | Admitting: Gastroenterology

## 2016-07-20 ENCOUNTER — Telehealth: Payer: Self-pay | Admitting: Gastroenterology

## 2016-07-20 NOTE — Telephone Encounter (Signed)
Patient would like to get results and also will she need other tests since she had a blockage

## 2016-07-21 NOTE — Telephone Encounter (Signed)
Call patient with results please and she would like to know if Dr. Allen Norris can order other test for her or should she see her PCP for that? Please call

## 2016-07-22 ENCOUNTER — Other Ambulatory Visit: Payer: Self-pay

## 2016-07-22 DIAGNOSIS — K5669 Other partial intestinal obstruction: Secondary | ICD-10-CM

## 2016-07-22 NOTE — Telephone Encounter (Signed)
Pt scheduled for a Small bowel follow through at [facility] on 07/28/16. Pt has been notified of this appt and instructions.

## 2016-07-27 ENCOUNTER — Encounter: Payer: Self-pay | Admitting: Gastroenterology

## 2016-07-27 ENCOUNTER — Other Ambulatory Visit: Payer: Self-pay

## 2016-07-27 ENCOUNTER — Ambulatory Visit (INDEPENDENT_AMBULATORY_CARE_PROVIDER_SITE_OTHER): Payer: Federal, State, Local not specified - PPO | Admitting: Gastroenterology

## 2016-07-27 DIAGNOSIS — K52832 Lymphocytic colitis: Secondary | ICD-10-CM

## 2016-07-27 NOTE — Progress Notes (Signed)
Primary Care Physician: Adin Hector, MD  Primary Gastroenterologist:  Dr. Lucilla Lame  Chief Complaint  Patient presents with  . Follow up colonoscopy results    HPI: Adriana Hicks is a 71 y.o. female here for follow-up after having a colonoscopy for diarrhea.  The patient had biopsies of the terminal ileum and random biopsies of the colon. The patient was found to have lymphocytic infiltrate of the colon and small bowel consistent with lymphocytic colitis and Enteritis.  The patient is supposed to have a small bowel follow-through to evaluate for possible strictures tomorrow.  Current Outpatient Prescriptions  Medication Sig Dispense Refill  . amphetamine-dextroamphetamine (ADDERALL) 20 MG tablet Take 20 mg by mouth daily.    . Ascorbic Acid (VITAMIN C PO) Take by mouth.    Marland Kitchen aspirin EC 81 MG tablet Take by mouth.    Marland Kitchen azelastine (ASTELIN) 0.1 % nasal spray Place into both nostrils 2 (two) times daily as needed for rhinitis. Use in each nostril as directed    . bismuth subsalicylate (PEPTO BISMOL) 262 MG/15ML suspension Take 30 mLs by mouth every 6 (six) hours as needed.    . cholecalciferol (VITAMIN D) 1000 UNITS tablet Take 1,000 Units by mouth daily.    . Clobetasol Propionate (CLODAN) 0.05 % shampoo Apply topically daily.    . cyanocobalamin (,VITAMIN B-12,) 1000 MCG/ML injection USE 1 ML PER INJECTION, IM WEEKLY 10 mL 3  . doxepin (SINEQUAN) 10 MG capsule TAKE 1 CAPSULE (10 MG TOTAL) BY MOUTH AT BEDTIME. 90 capsule 1  . ferrous sulfate 324 (65 FE) MG TBEC Take by mouth as needed. Reported on 06/17/2015    . ketoconazole (NIZORAL) 2 % shampoo Apply 1 application topically 2 (two) times a week.    . levothyroxine (SYNTHROID, LEVOTHROID) 75 MCG tablet TAKE 1 TABLET BY MOUTH DAILY 30 tablet 0  . losartan (COZAAR) 25 MG tablet TAKE 1 TABLET (25 MG TOTAL) BY MOUTH DAILY. 90 tablet 1  . Luliconazole (LUZU) 1 % CREA Apply topically.    . Melatonin 5 MG TABS Take by mouth at  bedtime as needed.    . mometasone (ELOCON) 0.1 % lotion Apply topically daily.    . Multiple Vitamins-Minerals (MULTIVITAMIN PO) Take by mouth.    . Na Sulfate-K Sulfate-Mg Sulf (SUPREP BOWEL PREP KIT) 17.5-3.13-1.6 GM/180ML SOLN Take 1 kit by mouth as directed. 1 Bottle 0  . Probiotic Product (PROBIOTIC PO) Take by mouth daily.    . psyllium (METAMUCIL) 58.6 % packet Take 1 packet by mouth daily.    . ranitidine (ZANTAC) 150 MG capsule Take 150 mg by mouth 2 (two) times daily.    . S-Adenosylmethionine (SAM-E PO) Take by mouth daily.    . valACYclovir (VALTREX) 500 MG tablet TAKE 1 TABLET BY MOUTH DAILY AND INCREASE TO 2 TABLETS DAILY X 3 DAYS AT ONSET OF OUTBREAK 45 tablet 12  . YUVAFEM 10 MCG TABS vaginal tablet PLACE 1 TABLET (10 MCG TOTAL) VAGINALLY 2 (TWO) TIMES A WEEK. 24 tablet 3   No current facility-administered medications for this visit.     Allergies as of 07/27/2016 - Review Complete 07/27/2016  Allergen Reaction Noted  . Sulfamethoxazole Other (See Comments) 05/17/2015    ROS:  General: Negative for anorexia, weight loss, fever, chills, fatigue, weakness. ENT: Negative for hoarseness, difficulty swallowing , nasal congestion. CV: Negative for chest pain, angina, palpitations, dyspnea on exertion, peripheral edema.  Respiratory: Negative for dyspnea at rest, dyspnea on exertion,  cough, sputum, wheezing.  GI: See history of present illness. GU:  Negative for dysuria, hematuria, urinary incontinence, urinary frequency, nocturnal urination.  Endo: Negative for unusual weight change.    Physical Examination:   LMP 03/23/1994   General: Well-nourished, well-developed in no acute distress.  Eyes: No icterus. Conjunctivae pink. Mouth: Oropharyngeal mucosa moist and pink , no lesions erythema or exudate. Lungs: Clear to auscultation bilaterally. Non-labored. Heart: Regular rate and rhythm, no murmurs rubs or gallops.  Abdomen: Bowel sounds are normal, nontender,  nondistended, no hepatosplenomegaly or masses, no abdominal bruits or hernia , no rebound or guarding.   Extremities: No lower extremity edema. No clubbing or deformities. Neuro: Alert and oriented x 3.  Grossly intact. Skin: Warm and dry, no jaundice.   Psych: Alert and cooperative, normal mood and affect.  Labs:    Imaging Studies: No results found.  Assessment and Plan:   Adriana Hicks is a 71 y.o. 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  The patient will be started on budesonide 9 mg a day and she has been told to take Imodium as needed for her diarrhea.  The patient will contact me if her symptoms do not improve.    Lucilla Lame, MD. Marval Regal   Note: This dictation was prepared with Dragon dictation along with smaller phrase technology. Any transcriptional errors that result from this process are unintentional.

## 2016-07-28 ENCOUNTER — Ambulatory Visit
Admission: RE | Admit: 2016-07-28 | Discharge: 2016-07-28 | Disposition: A | Discharge status: Home / Self Care | Payer: Federal, State, Local not specified - PPO | Source: Ambulatory Visit | Attending: Gastroenterology | Admitting: Gastroenterology

## 2016-07-28 ENCOUNTER — Other Ambulatory Visit: Payer: Self-pay

## 2016-07-28 DIAGNOSIS — K449 Diaphragmatic hernia without obstruction or gangrene: Secondary | ICD-10-CM | POA: Insufficient documentation

## 2016-07-28 DIAGNOSIS — K5669 Other partial intestinal obstruction: Secondary | ICD-10-CM | POA: Diagnosis present

## 2016-07-28 MED ORDER — BUDESONIDE 3 MG PO CPEP: 9.0000 mg | ORAL_CAPSULE | ORAL | 1 refills | Status: DC

## 2016-07-29 ENCOUNTER — Telehealth: Payer: Self-pay

## 2016-07-29 NOTE — Telephone Encounter (Signed)
-----   Message from Lucilla Lame, MD sent at 07/29/2016  7:50 AM EDT ----- Let the patient know that the small bowel series did not show any obstruction or narrowing in the small bowel. The radiologist had suggested further investigation of the pelvis due to discomfort during the exam with a CT scan of the pelvis. I have forwarded this report to the primary doctor.

## 2016-07-29 NOTE — Telephone Encounter (Signed)
Pt notified of small bowel follow through results.  

## 2016-08-03 ENCOUNTER — Telehealth: Payer: Self-pay | Admitting: Gastroenterology

## 2016-08-03 NOTE — Telephone Encounter (Signed)
Budesonide 3 capsules daily.  She is a little constipated. Can she cut back a little? Please call

## 2016-08-18 ENCOUNTER — Encounter: Payer: Self-pay | Admitting: Certified Nurse Midwife

## 2016-08-18 ENCOUNTER — Other Ambulatory Visit (HOSPITAL_COMMUNITY)
Admission: RE | Admit: 2016-08-18 | Discharge: 2016-08-18 | Disposition: A | Discharge status: Home / Self Care | Payer: Federal, State, Local not specified - PPO | Source: Ambulatory Visit | Attending: Certified Nurse Midwife | Admitting: Certified Nurse Midwife

## 2016-08-18 ENCOUNTER — Ambulatory Visit (INDEPENDENT_AMBULATORY_CARE_PROVIDER_SITE_OTHER): Payer: Federal, State, Local not specified - PPO | Admitting: Certified Nurse Midwife

## 2016-08-18 VITALS — BP 120/68 | HR 68 | Resp 16 | Ht 62.5 in | Wt 139.2 lb

## 2016-08-18 DIAGNOSIS — N952 Postmenopausal atrophic vaginitis: Secondary | ICD-10-CM

## 2016-08-18 DIAGNOSIS — Z124 Encounter for screening for malignant neoplasm of cervix: Secondary | ICD-10-CM | POA: Diagnosis not present

## 2016-08-18 DIAGNOSIS — Z853 Personal history of malignant neoplasm of breast: Secondary | ICD-10-CM | POA: Diagnosis not present

## 2016-08-18 DIAGNOSIS — Z01419 Encounter for gynecological examination (general) (routine) without abnormal findings: Secondary | ICD-10-CM

## 2016-08-18 MED ORDER — ESTRADIOL 10 MCG VA TABS: ORAL_TABLET | VAGINAL | 3 refills | Status: DC

## 2016-08-18 NOTE — Patient Instructions (Signed)

## 2016-08-18 NOTE — Progress Notes (Signed)
71 y.o. G43P1001 Married  Caucasian Fe here for annual exam. Menopausal no vaginal bleeding. Vaginal dryness better with vagifem and coconut oil use. Caring for her mother, which has been stressful, she is 28 and will be going from hospital to nursing facility at this point. Patient seen by PCP for persistent diarrhea, had numerous labs and colonoscopy, just IBS. Now on medication which has controlled so far. All labs per patient normal. Patient had implants replaced this past year (1/17) with bilateral silicone. Has been happy with results, especially on left with transflap tissue. Does SBE as recommended. No other health concerns today. Would like to plan a vacation.  Patient's last menstrual period was 03/23/1994.          Sexually active: Yes.    The current method of family planning is tubal ligation.    Exercising: No.  The patient does not participate in regular exercise at present. Smoker:  no  Health Maintenance: Pap:  08-12-14 neg, 08-13-15 neg History of Abnormal Pap: Yes LEEP MMG:  Rt breast mmg & rt breast u/s 10/17 category b density birads 2:neg, mastectomy rt breast Self Breast exams: yes Colonoscopy:  07-16-16 inflammatory changes only BMD:   2017 osteopenia PCP management TDaP:  2012 Shingles: 2015 Pneumonia: 2016 Hep C and HIV: both neg 2017 Labs: PCP   reports that she quit smoking about 12 years ago. Her smoking use included Cigarettes. She has a 35.00 pack-year smoking history. She has never used smokeless tobacco. She reports that she drinks about 4.2 oz of alcohol per week . She reports that she does not use drugs.  Past Medical History:  Diagnosis Date  . Abnormal Pap smear of cervix 3/15   ASCUS HPV not detected  . ADD (attention deficit disorder)   . ADD (attention deficit disorder)   . Anemia   . Arthritis    fingers  . Breast cancer ([place])    Left; Age 49  . Genital herpes 03/26/2009  . GERD (gastroesophageal reflux disease)   . History of chest pain     Related to reflux  . HSV-2 (herpes simplex virus 2) infection   . Hyperlipidemia   . Hypertension   . Hypothyroidism   . Personal history of tobacco use, presenting hazards to health 06/26/2015  . S/P appendectomy 1981  . Seborrheic dermatitis   . Vertigo    x1 - approx 5 yrs ago    Past Surgical History:  Procedure Laterality Date  . APPENDECTOMY    . AUGMENTATION MAMMAPLASTY  1982  . BREAST IMPLANT EXCHANGE Bilateral 04/21/2016   Procedure: REMOVAL OF BREAST IMPLANTS WITH IMMEDIATE REPLACMENTOF BREAST IMPLANTS;  Surgeon: Youlanda Roys, MD;  Location: [place];  Service: Plastics;  Laterality: Bilateral;  . BREAST SURGERY  2005   Breast Implant replaceddue to leaking  . BREAST SURGERY Left 2006   due to asymmetry  . CARDIAC CATHETERIZATION     2011 at [facility] - reflux related  . CERVICAL BIOPSY  W/ LOOP ELECTRODE EXCISION     unsure pathology   . CESAREAN SECTION  1985  . COLONOSCOPY WITH PROPOFOL N/A 06/17/2015   Procedure: COLONOSCOPY WITH PROPOFOL;  Surgeon: Lucilla Lame, MD;  Location: [place];  Service: Endoscopy;  Laterality: N/A;  PT PREFERS EARLY AM  . COLONOSCOPY WITH PROPOFOL N/A 07/16/2016   Procedure: COLONOSCOPY WITH PROPOFOL;  Surgeon: Lucilla Lame, MD;  Location: [place];  Service: Endoscopy;  Laterality: N/A;  requests early as  possible  . DILATION AND CURETTAGE OF UTERUS    . ENDOMETRIAL BIOPSY    . MASTECTOMY Left    new implant on the Right  . MASTOPEXY Right 04/21/2016   Procedure: MASTOPEXY;  Surgeon: Youlanda Roys, MD;  Location: [place];  Service: Plastics;  Laterality: Right;  . POLYPECTOMY  06/17/2015   Procedure: POLYPECTOMY;  Surgeon: Lucilla Lame, MD;  Location: [place];  Service: Endoscopy;;  . TONSILLECTOMY  child  . TUBAL LIGATION      Current Outpatient Prescriptions  Medication Sig Dispense Refill  . amphetamine-dextroamphetamine (ADDERALL) 20 MG tablet  Take 20 mg by mouth daily.    . Ascorbic Acid (VITAMIN C PO) Take by mouth.    Marland Kitchen aspirin EC 81 MG tablet Take by mouth.    Marland Kitchen azelastine (ASTELIN) 0.1 % nasal spray Place into both nostrils 2 (two) times daily as needed for rhinitis. Use in each nostril as directed    . bismuth subsalicylate (PEPTO BISMOL) 262 MG/15ML suspension Take 30 mLs by mouth every 6 (six) hours as needed.    . budesonide (ENTOCORT EC) 3 MG 24 hr capsule Take 3 capsules (9 mg total) by mouth every morning. 90 capsule 1  . cholecalciferol (VITAMIN D) 1000 UNITS tablet Take 1,000 Units by mouth daily.    . Clobetasol Propionate (CLODAN) 0.05 % shampoo Apply topically daily.    . cyanocobalamin (,VITAMIN B-12,) 1000 MCG/ML injection USE 1 ML PER INJECTION, IM WEEKLY 10 mL 3  . doxepin (SINEQUAN) 10 MG capsule TAKE 1 CAPSULE (10 MG TOTAL) BY MOUTH AT BEDTIME. 90 capsule 1  . ferrous sulfate 324 (65 FE) MG TBEC Take by mouth as needed. Reported on 06/17/2015    . levothyroxine (SYNTHROID, LEVOTHROID) 75 MCG tablet TAKE 1 TABLET BY MOUTH DAILY 30 tablet 0  . losartan (COZAAR) 25 MG tablet TAKE 1 TABLET (25 MG TOTAL) BY MOUTH DAILY. 90 tablet 1  . Luliconazole (LUZU) 1 % CREA Apply topically.    . Melatonin 5 MG TABS Take by mouth at bedtime as needed.    . mometasone (ELOCON) 0.1 % lotion Apply topically daily.    . Multiple Vitamins-Minerals (MULTIVITAMIN PO) Take by mouth.    . Probiotic Product (PROBIOTIC PO) Take by mouth daily.    . S-Adenosylmethionine (SAM-E PO) Take by mouth daily.    . valACYclovir (VALTREX) 500 MG tablet TAKE 1 TABLET BY MOUTH DAILY AND INCREASE TO 2 TABLETS DAILY X 3 DAYS AT ONSET OF OUTBREAK 45 tablet 12  . YUVAFEM 10 MCG TABS vaginal tablet PLACE 1 TABLET (10 MCG TOTAL) VAGINALLY 2 (TWO) TIMES A WEEK. 24 tablet 3   No current facility-administered medications for this visit.     Family History  Problem Relation Age of Onset  . Breast cancer Mother 18  . Heart disease Father   . Skin cancer  Brother   . Crohn's disease Brother     ROS:  Pertinent items are noted in HPI.  Otherwise, a comprehensive ROS was negative.  Exam:   BP 120/68 (BP Location: Right Arm, Patient Position: Sitting, Cuff Size: Normal)   Pulse 68   Resp 16   Ht 5' 2.5" (1.588 m)   Wt 139 lb 3.2 oz (63.1 kg)   LMP 03/23/1994   BMI 25.05 kg/m  Height: 5' 2.5" (158.8 cm) Ht Readings from Last 3 Encounters:  08/18/16 5' 2.5" (1.588 m)  07/13/16 5\' 3"  (1.6 m)  04/21/16 5\' 3"  (1.6  m)    General appearance: alert, cooperative and appears stated age Head: Normocephalic, without obvious abnormality, atraumatic Neck: no adenopathy, supple, symmetrical, trachea midline and thyroid normal to inspection and palpation Lungs: clear to auscultation bilaterally Breasts: No nipple discharge or bleeding, No axillary or supraclavicular adenopathy, surgical scarring on left with transflap, no masses bilateral and implants feel intact. Heart: regular rate and rhythm Abdomen: soft, non-tender; no masses,  no organomegaly Extremities: extremities normal, atraumatic, no cyanosis or edema Skin: Skin color, texture, turgor normal. No rashes or lesions Lymph nodes: Cervical, supraclavicular, and axillary nodes normal. No abnormal inguinal nodes palpated Neurologic: Grossly normal   Pelvic: External genitalia:  no lesions              Urethra:  normal appearing urethra with no masses, tenderness or lesions              Bartholin's and Skene's: normal                 Vagina: normal appearing vagina with normal color and discharge, no lesions              Cervix: no bleeding following Pap, no cervical motion tenderness and no lesions              Pap taken: Yes.   per request Bimanual Exam:  Uterus:  normal size, contour, position, consistency, mobility, non-tender              Adnexa: normal adnexa and no mass, fullness, tenderness               Rectovaginal: Confirms               Anus:  normal sphincter tone, no  lesions  Chaperone present: yes  A:  Well Woman with normal exam  Menopausal no HRT  Atrophic vaginitis with Vagifem use with good results  History of left breast cancer recent bilateral implant replacement  Diagnosed with stress related IBS with social stress with caring for mother  Hypertension,Hypothyroid management with PCP  P:   Reviewed health and wellness pertinent to exam  Aware of need to evaluate if vaginal bleeding  Discussed risks/benefits of Vagifem use and history of breast cancer. Patient comfortable with use and only uses twice weekly. Uses coconut oil other times.  Rx Yuvafem see order with instructions  Continue SBE and follow up with implants as indicated.  Continue follow up with PCP as indicated.  Discussed seeking family, friends support as needed with caring for mother.  Pap smear: yes  counseled on breast self exam, mammography screening, adequate intake of calcium and vitamin D, diet and exercise  return annually or prn  An After Visit Summary was printed and given to the patient.

## 2016-08-20 LAB — CYTOLOGY - PAP
Diagnosis: UNDETERMINED — AB
HPV: NOT DETECTED

## 2016-08-21 ENCOUNTER — Other Ambulatory Visit: Payer: Self-pay | Admitting: Certified Nurse Midwife

## 2016-08-21 NOTE — Addendum Note (Signed)
Addendum  created 08/21/16 1014 by Rica Koyanagi, MD   Sign clinical note

## 2016-08-30 ENCOUNTER — Other Ambulatory Visit: Payer: Self-pay | Admitting: Physician Assistant

## 2016-08-30 DIAGNOSIS — I10 Essential (primary) hypertension: Secondary | ICD-10-CM

## 2016-08-30 DIAGNOSIS — G4700 Insomnia, unspecified: Secondary | ICD-10-CM

## 2016-09-07 ENCOUNTER — Other Ambulatory Visit: Payer: Self-pay | Admitting: Family Medicine

## 2016-09-15 ENCOUNTER — Other Ambulatory Visit: Payer: Self-pay

## 2016-09-15 MED ORDER — BUDESONIDE 3 MG PO CPEP: 9.0000 mg | ORAL_CAPSULE | ORAL | 1 refills | Status: DC

## 2016-10-14 ENCOUNTER — Other Ambulatory Visit: Payer: Self-pay

## 2016-10-14 MED ORDER — BUDESONIDE 3 MG PO CPEP: 9.0000 mg | ORAL_CAPSULE | ORAL | 3 refills | Status: DC

## 2016-12-07 ENCOUNTER — Other Ambulatory Visit: Payer: Self-pay | Admitting: Certified Nurse Midwife

## 2016-12-07 DIAGNOSIS — Z1239 Encounter for other screening for malignant neoplasm of breast: Secondary | ICD-10-CM

## 2016-12-07 DIAGNOSIS — K52832 Lymphocytic colitis: Secondary | ICD-10-CM | POA: Insufficient documentation

## 2016-12-31 ENCOUNTER — Telehealth: Payer: Self-pay | Admitting: *Deleted

## 2016-12-31 DIAGNOSIS — Z87891 Personal history of nicotine dependence: Secondary | ICD-10-CM

## 2016-12-31 DIAGNOSIS — Z122 Encounter for screening for malignant neoplasm of respiratory organs: Secondary | ICD-10-CM

## 2016-12-31 NOTE — Telephone Encounter (Signed)
Notified patient that annual lung cancer screening low dose CT scan is due currently or will be in near future. Confirmed that patient is within the age range of 55-77, and asymptomatic, (no signs or symptoms of lung cancer). Patient denies illness that would prevent curative treatment for lung cancer if found. Verified smoking history, (former, quit 2005, 35 pack year). The shared decision making visit was done 06/26/15. Patient is agreeable for CT scan being scheduled.

## 2017-01-08 ENCOUNTER — Ambulatory Visit
Admission: RE | Admit: 2017-01-08 | Discharge: 2017-01-08 | Disposition: A | Discharge status: Home / Self Care | Payer: Federal, State, Local not specified - PPO | Source: Ambulatory Visit | Attending: Oncology | Admitting: Oncology

## 2017-01-08 DIAGNOSIS — J439 Emphysema, unspecified: Secondary | ICD-10-CM | POA: Diagnosis not present

## 2017-01-08 DIAGNOSIS — Z122 Encounter for screening for malignant neoplasm of respiratory organs: Secondary | ICD-10-CM | POA: Insufficient documentation

## 2017-01-08 DIAGNOSIS — Z87891 Personal history of nicotine dependence: Secondary | ICD-10-CM | POA: Diagnosis present

## 2017-01-11 ENCOUNTER — Telehealth: Payer: Self-pay | Admitting: *Deleted

## 2017-01-11 ENCOUNTER — Encounter: Payer: Self-pay | Admitting: *Deleted

## 2017-01-11 ENCOUNTER — Encounter: Payer: Self-pay | Admitting: Certified Nurse Midwife

## 2017-01-11 NOTE — Telephone Encounter (Signed)
Call to patient. RN advised per result note from Melvia Heaps, CNM 09/01/16: pap ASCUS with negative HR HPV. 08 recall. Repeat pap in one year. No additional testing needed will repeat pap at aex. Patient verbalized understanding.   08 recall in place. Aex scheduled for 08/19/17.   Routing to provider for final review. Patient agreeable to disposition. Will close encounter.

## 2017-01-11 NOTE — Telephone Encounter (Signed)
MyChart message from patient:  Message   Hi Neoma Laming,  Should I have additional tests performed since my last pap smear showed  ATYPICAL SQUAMOUS CELLS OF UNDETERMINED SIGNIFICANCE (ASC-US)?

## 2017-01-19 DIAGNOSIS — I700 Atherosclerosis of aorta: Secondary | ICD-10-CM | POA: Insufficient documentation

## 2017-01-25 ENCOUNTER — Ambulatory Visit
Admission: RE | Admit: 2017-01-25 | Discharge: 2017-01-25 | Disposition: A | Discharge status: Home / Self Care | Payer: Medicare Other | Source: Ambulatory Visit | Attending: Certified Nurse Midwife | Admitting: Certified Nurse Midwife

## 2017-01-25 DIAGNOSIS — Z1239 Encounter for other screening for malignant neoplasm of breast: Secondary | ICD-10-CM

## 2017-05-14 ENCOUNTER — Other Ambulatory Visit: Payer: Self-pay | Admitting: Certified Nurse Midwife

## 2017-05-14 DIAGNOSIS — N952 Postmenopausal atrophic vaginitis: Secondary | ICD-10-CM

## 2017-05-14 MED ORDER — ESTRADIOL 10 MCG VA TABS: ORAL_TABLET | VAGINAL | 1 refills | Status: DC

## 2017-05-14 NOTE — Telephone Encounter (Signed)
Medication refill request: Yuvafem  Last AEX:  08/18/16 DL  Next AEX: 08/19/17  Last MMG (if hormonal medication request): 01/25/17 BIRADS 1 negative  Refill authorized: 08/18/16 #24, 3RF. Today, please advise.

## 2017-05-14 NOTE — Telephone Encounter (Signed)
Patient requests refill on Uvafem. No longer using mail order pharmacy would like refill sent to [facility] in [place] [address].

## 2017-06-09 ENCOUNTER — Other Ambulatory Visit: Payer: Self-pay | Admitting: Internal Medicine

## 2017-06-09 DIAGNOSIS — H539 Unspecified visual disturbance: Secondary | ICD-10-CM

## 2017-06-17 ENCOUNTER — Ambulatory Visit
Admission: RE | Admit: 2017-06-17 | Discharge: 2017-06-17 | Disposition: A | Discharge status: Home / Self Care | Payer: Federal, State, Local not specified - PPO | Source: Ambulatory Visit | Attending: Internal Medicine | Admitting: Internal Medicine

## 2017-06-17 DIAGNOSIS — H539 Unspecified visual disturbance: Secondary | ICD-10-CM | POA: Diagnosis not present

## 2017-06-17 DIAGNOSIS — I776 Arteritis, unspecified: Secondary | ICD-10-CM | POA: Diagnosis not present

## 2017-06-17 DIAGNOSIS — I6782 Cerebral ischemia: Secondary | ICD-10-CM | POA: Diagnosis not present

## 2017-06-17 DIAGNOSIS — R9082 White matter disease, unspecified: Secondary | ICD-10-CM | POA: Diagnosis not present

## 2017-06-17 DIAGNOSIS — G379 Demyelinating disease of central nervous system, unspecified: Secondary | ICD-10-CM | POA: Diagnosis not present

## 2017-06-17 MED ORDER — GADOBENATE DIMEGLUMINE 529 MG/ML IV SOLN
13.0000 mL | Freq: Once | INTRAVENOUS | Status: AC | PRN
  Administered 2017-06-17: 13 mL via INTRAVENOUS

## 2017-08-08 ENCOUNTER — Other Ambulatory Visit: Payer: Self-pay | Admitting: Certified Nurse Midwife

## 2017-08-09 NOTE — Telephone Encounter (Signed)
Medication refill request: Valtrex   Last AEX:  08-18-16  Next AEX: 08-19-17  Last MMG (if hormonal medication request): 01-25-17 WNL  Refill authorized: please advise

## 2017-08-19 ENCOUNTER — Other Ambulatory Visit: Payer: Self-pay

## 2017-08-19 ENCOUNTER — Other Ambulatory Visit (HOSPITAL_COMMUNITY)
Admission: RE | Admit: 2017-08-19 | Discharge: 2017-08-19 | Disposition: A | Discharge status: Home / Self Care | Payer: Federal, State, Local not specified - PPO | Source: Ambulatory Visit | Attending: Obstetrics & Gynecology | Admitting: Obstetrics & Gynecology

## 2017-08-19 ENCOUNTER — Encounter: Payer: Self-pay | Admitting: Certified Nurse Midwife

## 2017-08-19 ENCOUNTER — Ambulatory Visit (INDEPENDENT_AMBULATORY_CARE_PROVIDER_SITE_OTHER): Payer: Federal, State, Local not specified - PPO | Admitting: Certified Nurse Midwife

## 2017-08-19 VITALS — BP 116/68 | HR 70 | Resp 16 | Ht 62.5 in | Wt 139.0 lb

## 2017-08-19 DIAGNOSIS — A6009 Herpesviral infection of other urogenital tract: Secondary | ICD-10-CM

## 2017-08-19 DIAGNOSIS — Z8742 Personal history of other diseases of the female genital tract: Secondary | ICD-10-CM

## 2017-08-19 DIAGNOSIS — N952 Postmenopausal atrophic vaginitis: Secondary | ICD-10-CM | POA: Diagnosis not present

## 2017-08-19 DIAGNOSIS — Z124 Encounter for screening for malignant neoplasm of cervix: Secondary | ICD-10-CM

## 2017-08-19 DIAGNOSIS — Z01419 Encounter for gynecological examination (general) (routine) without abnormal findings: Secondary | ICD-10-CM

## 2017-08-19 DIAGNOSIS — R87610 Atypical squamous cells of undetermined significance on cytologic smear of cervix (ASC-US): Secondary | ICD-10-CM | POA: Insufficient documentation

## 2017-08-19 DIAGNOSIS — Z87898 Personal history of other specified conditions: Secondary | ICD-10-CM | POA: Diagnosis not present

## 2017-08-19 MED ORDER — VALACYCLOVIR HCL 500 MG PO TABS: ORAL_TABLET | ORAL | 12 refills | Status: DC

## 2017-08-19 MED ORDER — ESTRADIOL 10 MCG VA TABS: ORAL_TABLET | VAGINAL | 3 refills | Status: DC

## 2017-08-19 NOTE — Progress Notes (Signed)
72 y.o. G47P1001 Married  Caucasian Fe here for annual exam. Denies vaginal bleeding or vaginal dryness. Continues Yuvafem and coconut oil for vaginal dryness with good results.. Recently had eye surgery and had BP elevation and now MD has increased hypertension medication which is controlling.Sees PCP yearly in August for aex, labs, Vitamin D , hypertension and osteopenia management. Has stopped her Thyroid medication and is using Holistic iodine supplement with good results. Plans to continue if no issus and see how labs are at aex. HSV continues to occur at times and needs update on Rx. Has been reading about Josph Macho procedures, but feels not needed. No bladder or bowel issues. No other health issues today.  Patient's last menstrual period was 03/23/1994.          Sexually active: Yes.    The current method of family planning is tubal ligation.    Exercising: No.  exercise Smoker:  no  Health Maintenance: Pap:  08-13-15 neg, 08-18-16 ASCUS HPV HR neg History of Abnormal Pap: yes MMG:  01-25-17 rt breast category b density birads 1:neg, left breast mastectomy (implant in on both sides Self Breast exams: yes Colonoscopy:  2018 inflammatory changes only BMD:   2017  Due every 2 years Osteopenia TDaP:  2012 Shingles: 2015 Pneumonia: 2016 Hep C and HIV: both neg 2017 Labs: PCP   reports that she quit smoking about 13 years ago. Her smoking use included cigarettes. She has a 35.00 pack-year smoking history. She has never used smokeless tobacco. She reports that she drinks about 4.2 oz of alcohol per week. She reports that she does not use drugs.  Past Medical History:  Diagnosis Date  . Abnormal Pap smear of cervix 3/15   ASCUS HPV not detected  . ADD (attention deficit disorder)   . ADD (attention deficit disorder)   . Anemia   . Arthritis    fingers  . Breast cancer ([place])    Left; Age 25  . Colitis   . Genital herpes 03/26/2009  . GERD (gastroesophageal reflux disease)   . History of  chest pain    Related to reflux  . HSV-2 (herpes simplex virus 2) infection   . Hyperlipidemia   . Hypertension   . Hypothyroidism   . Personal history of tobacco use, presenting hazards to health 06/26/2015  . S/P appendectomy 1981  . Seborrheic dermatitis   . Vertigo    x1 - approx 5 yrs ago    Past Surgical History:  Procedure Laterality Date  . APPENDECTOMY    . AUGMENTATION MAMMAPLASTY  1982  . AUGMENTATION MAMMAPLASTY Bilateral 2355   silicone gel  . BREAST IMPLANT EXCHANGE Bilateral 04/21/2016   Procedure: REMOVAL OF BREAST IMPLANTS WITH IMMEDIATE REPLACMENTOF BREAST IMPLANTS;  Surgeon: Youlanda Roys, MD;  Location: [place];  Service: Plastics;  Laterality: Bilateral;  . BREAST SURGERY  2005   Breast Implant replaceddue to leaking  . BREAST SURGERY Left 2006   due to asymmetry  . CARDIAC CATHETERIZATION     2011 at [facility] - reflux related  . CERVICAL BIOPSY  W/ LOOP ELECTRODE EXCISION     unsure pathology   . CESAREAN SECTION  1985  . COLONOSCOPY WITH PROPOFOL N/A 06/17/2015   Procedure: COLONOSCOPY WITH PROPOFOL;  Surgeon: Lucilla Lame, MD;  Location: [place];  Service: Endoscopy;  Laterality: N/A;  PT PREFERS EARLY AM  . COLONOSCOPY WITH PROPOFOL N/A 07/16/2016   Procedure: COLONOSCOPY WITH PROPOFOL;  Surgeon: Evangeline Gula  Allen Norris, MD;  Location: [place];  Service: Endoscopy;  Laterality: N/A;  requests early as possible  . COSMETIC SURGERY     eyelid surgery  . DILATION AND CURETTAGE OF UTERUS    . ENDOMETRIAL BIOPSY    . MASTECTOMY Left    new implant on the Right  . MASTOPEXY Right 04/21/2016   Procedure: MASTOPEXY;  Surgeon: Youlanda Roys, MD;  Location: [place];  Service: Plastics;  Laterality: Right;  . POLYPECTOMY  06/17/2015   Procedure: POLYPECTOMY;  Surgeon: Lucilla Lame, MD;  Location: [place];  Service: Endoscopy;;  . TONSILLECTOMY  child  . TUBAL LIGATION      Current  Outpatient Medications  Medication Sig Dispense Refill  . amphetamine-dextroamphetamine (ADDERALL) 20 MG tablet Take 20 mg by mouth daily.    . Ascorbic Acid (VITAMIN C PO) Take by mouth.    Marland Kitchen azelastine (ASTELIN) 0.1 % nasal spray Place into both nostrils 2 (two) times daily as needed for rhinitis. Use in each nostril as directed    . budesonide (ENTOCORT EC) 3 MG 24 hr capsule Take 3 capsules (9 mg total) by mouth every morning. (Patient taking differently: Take 9 mg by mouth as needed. ) 270 capsule 3  . cholecalciferol (VITAMIN D) 1000 UNITS tablet Take 1,000 Units by mouth daily.    . Clobetasol Propionate (CLODAN) 0.05 % shampoo Apply topically daily.    . cyanocobalamin (,VITAMIN B-12,) 1000 MCG/ML injection USE 1 ML PER INJECTION, IM WEEKLY 10 mL 3  . doxepin (SINEQUAN) 10 MG capsule TAKE 1 CAPSULE (10 MG TOTAL) BY MOUTH AT BEDTIME. 90 capsule 1  . Estradiol (YUVAFEM) 10 MCG TABS vaginal tablet PLACE 1 TABLET (10 MCG TOTAL) VAGINALLY 2 (TWO) TIMES A WEEK. 24 tablet 1  . ferrous sulfate 324 (65 FE) MG TBEC Take by mouth as needed. Reported on 06/17/2015    . losartan (COZAAR) 100 MG tablet Take 100 mg by mouth daily.    . Luliconazole (LUZU) 1 % CREA Apply topically.    . Melatonin 5 MG TABS Take by mouth at bedtime.     . Multiple Vitamins-Minerals (MULTIVITAMIN PO) Take by mouth.    . Probiotic Product (PROBIOTIC PO) Take by mouth daily.    . S-Adenosylmethionine (SAM-E PO) Take by mouth daily.    . valACYclovir (VALTREX) 500 MG tablet TAKE 1 TABLET BY MOUTH DAILY AND INCREASE TO 2 TABLETS DAILY X 3 DAYS AT ONSET OF OUTBREAK 45 tablet 4   No current facility-administered medications for this visit.     Family History  Problem Relation Age of Onset  . Breast cancer Mother 89  . Heart disease Father   . Skin cancer Brother   . Crohn's disease Brother     ROS:  Pertinent items are noted in HPI.  Otherwise, a comprehensive ROS was negative.  Exam:   BP 116/68   Pulse 70   Resp  16   Ht 5' 2.5" (1.588 m)   Wt 139 lb (63 kg)   LMP 03/23/1994   BMI 25.02 kg/m  Height: 5' 2.5" (158.8 cm) Ht Readings from Last 3 Encounters:  08/19/17 5' 2.5" (1.588 m)  01/08/17 5\' 3"  (1.6 m)  08/18/16 5' 2.5" (1.588 m)    General appearance: alert, cooperative and appears stated age Head: Normocephalic, without obvious abnormality, atraumatic Neck: no adenopathy, supple, symmetrical, trachea midline and thyroid normal to inspection and palpation Lungs: clear to auscultation bilaterally Breasts: normal appearance, no  masses or tenderness, No nipple retraction or dimpling, No nipple discharge or bleeding, No axillary or supraclavicular adenopathy Heart: regular rate and rhythm Abdomen: soft, non-tender; no masses,  no organomegaly Extremities: extremities normal, atraumatic, no cyanosis or edema Skin: Skin color, texture, turgor normal. No rashes or lesions Lymph nodes: Cervical, supraclavicular, and axillary nodes normal. No abnormal inguinal nodes palpated Neurologic: Grossly normal   Pelvic: External genitalia:  no lesions, normal female              Urethra:  normal appearing urethra with no masses, tenderness or lesions              Bartholin's and Skene's: normal                 Vagina: normal appearing vagina with normal color and discharge, no lesions              Cervix: no cervical motion tenderness, no lesions, nulliparous appearance and previous cryo appearance also              Pap taken: Yes.   Bimanual Exam:  Uterus:  normal size, contour, position, consistency, mobility, non-tender              Adnexa: normal adnexa and no mass, fullness, tenderness               Rectovaginal: Confirms               Anus:  normal sphincter tone, no lesions  Chaperone present: yes  A:  Well Woman with normal exam  Post menopausal no HRT  History of HSV genital Valtrex working well  Atrophic vaginitis Yuvafem and coconut oil work well  History of abnormal pap in past with  previous cryo, Follow up pap today with ASCUS negative HPV  Hypertension, Vitamin D, Hypothyroid with MD management/ holistic management  BMD due this year, does with PCP P:   Reviewed health and wellness pertinent to exam  Aware of need to advise if vaginal bleeding  Rx Valtrex see order with instructions  Risks/benefits/warning signs with Yuvafem reviewed, requests Rx  Rx Yuvafem see order with instructions  Continue follow up with MD as indicated  Pap smear: yes   counseled on breast self exam, mammography screening, adequate intake of calcium and vitamin D, diet and exercise, Kegel's exercises  return annually or prn  An After Visit Summary was printed and given to the patient.

## 2017-08-19 NOTE — Patient Instructions (Signed)

## 2017-08-20 LAB — CYTOLOGY - PAP: Diagnosis: NEGATIVE

## 2017-11-11 ENCOUNTER — Other Ambulatory Visit: Payer: Self-pay | Admitting: Certified Nurse Midwife

## 2017-11-11 DIAGNOSIS — N952 Postmenopausal atrophic vaginitis: Secondary | ICD-10-CM

## 2017-11-11 NOTE — Telephone Encounter (Signed)
Medication refill request: Yuvafam Vaginal tab  Last AEX:  08/19/17 Next AEX:08/25/2018  Last MMG (if hormonal medication request): 01/25/17 Bi-rads category 1 negative  Refill authorized: Please advise.

## 2017-12-23 ENCOUNTER — Other Ambulatory Visit: Payer: Self-pay | Admitting: Certified Nurse Midwife

## 2017-12-23 DIAGNOSIS — Z1231 Encounter for screening mammogram for malignant neoplasm of breast: Secondary | ICD-10-CM

## 2017-12-25 ENCOUNTER — Telehealth: Payer: Self-pay

## 2017-12-25 NOTE — Telephone Encounter (Signed)
Call pt regarding lung screening left message at 10:17.

## 2017-12-27 ENCOUNTER — Telehealth: Payer: Self-pay | Admitting: *Deleted

## 2017-12-27 DIAGNOSIS — Z122 Encounter for screening for malignant neoplasm of respiratory organs: Secondary | ICD-10-CM

## 2017-12-27 DIAGNOSIS — Z87891 Personal history of nicotine dependence: Secondary | ICD-10-CM

## 2017-12-27 NOTE — Telephone Encounter (Signed)
Patient has been notified that annual lung cancer screening low dose CT scan is due currently or will be in near future. Confirmed that patient is within the age range of 55-77, and asymptomatic, (no signs or symptoms of lung cancer). Patient denies illness that would prevent curative treatment for lung cancer if found. Verified smoking history, (former, quit 2005, 35 pack year). The shared decision making visit was done 06/26/15. Patient is agreeable for CT scan being scheduled.

## 2018-01-03 ENCOUNTER — Telehealth: Payer: Self-pay | Admitting: *Deleted

## 2018-01-03 NOTE — Telephone Encounter (Signed)
Called pt to discuss appt for ldct screening on 01-13-18 at  8am here at [place] voiced understanding.  appt mailed to pt

## 2018-01-03 NOTE — Telephone Encounter (Signed)
Called patient to inform her of her appt for ldct screening on 01-11-18 at 1:05 pm at [place], unable to attend this appt.

## 2018-01-11 ENCOUNTER — Ambulatory Visit: Payer: Federal, State, Local not specified - PPO

## 2018-01-13 ENCOUNTER — Inpatient Hospital Stay: Admission: RE | Admit: 2018-01-13 | Payer: Federal, State, Local not specified - PPO | Source: Ambulatory Visit

## 2018-01-13 ENCOUNTER — Ambulatory Visit
Admission: RE | Admit: 2018-01-13 | Discharge: 2018-01-13 | Disposition: A | Discharge status: Home / Self Care | Payer: Federal, State, Local not specified - PPO | Source: Ambulatory Visit | Attending: Nurse Practitioner | Admitting: Nurse Practitioner

## 2018-01-13 DIAGNOSIS — J439 Emphysema, unspecified: Secondary | ICD-10-CM | POA: Insufficient documentation

## 2018-01-13 DIAGNOSIS — Z122 Encounter for screening for malignant neoplasm of respiratory organs: Secondary | ICD-10-CM | POA: Diagnosis not present

## 2018-01-13 DIAGNOSIS — Z87891 Personal history of nicotine dependence: Secondary | ICD-10-CM | POA: Diagnosis not present

## 2018-01-13 DIAGNOSIS — I2510 Atherosclerotic heart disease of native coronary artery without angina pectoris: Secondary | ICD-10-CM | POA: Diagnosis not present

## 2018-01-13 DIAGNOSIS — R918 Other nonspecific abnormal finding of lung field: Secondary | ICD-10-CM | POA: Insufficient documentation

## 2018-01-13 DIAGNOSIS — I700 Atherosclerosis of aorta: Secondary | ICD-10-CM | POA: Diagnosis not present

## 2018-01-14 ENCOUNTER — Encounter: Payer: Self-pay | Admitting: *Deleted

## 2018-01-26 ENCOUNTER — Other Ambulatory Visit: Payer: Self-pay | Admitting: Certified Nurse Midwife

## 2018-01-26 ENCOUNTER — Ambulatory Visit
Admission: RE | Admit: 2018-01-26 | Discharge: 2018-01-26 | Disposition: A | Discharge status: Home / Self Care | Payer: Federal, State, Local not specified - PPO | Source: Ambulatory Visit | Attending: Certified Nurse Midwife | Admitting: Certified Nurse Midwife

## 2018-01-26 DIAGNOSIS — Z1231 Encounter for screening mammogram for malignant neoplasm of breast: Secondary | ICD-10-CM

## 2018-01-26 HISTORY — DX: Personal history of antineoplastic chemotherapy: Z92.21

## 2018-03-03 DIAGNOSIS — M810 Age-related osteoporosis without current pathological fracture: Secondary | ICD-10-CM | POA: Insufficient documentation

## 2018-04-26 ENCOUNTER — Other Ambulatory Visit: Payer: Self-pay | Admitting: Student

## 2018-04-26 DIAGNOSIS — M7062 Trochanteric bursitis, left hip: Secondary | ICD-10-CM

## 2018-04-26 DIAGNOSIS — M7061 Trochanteric bursitis, right hip: Secondary | ICD-10-CM

## 2018-04-27 ENCOUNTER — Other Ambulatory Visit: Payer: Self-pay

## 2018-04-27 ENCOUNTER — Ambulatory Visit: Payer: Federal, State, Local not specified - PPO | Attending: Student

## 2018-04-27 DIAGNOSIS — G8929 Other chronic pain: Secondary | ICD-10-CM | POA: Diagnosis present

## 2018-04-27 DIAGNOSIS — M6281 Muscle weakness (generalized): Secondary | ICD-10-CM | POA: Diagnosis present

## 2018-04-27 DIAGNOSIS — M25551 Pain in right hip: Secondary | ICD-10-CM | POA: Insufficient documentation

## 2018-04-27 DIAGNOSIS — M25552 Pain in left hip: Secondary | ICD-10-CM | POA: Diagnosis present

## 2018-04-27 NOTE — Therapy (Signed)
[facility] [address] [place], [address] Phone: [phone]   Fax:  [phone]  Physical Therapy Evaluation  Patient Details  Name: Adriana Hicks MRN: [id number] Date of Birth: Nov 27, 1945 Referring Provider (PT): [name]   Encounter Date: 04/27/2018  PT End of Session - 04/27/18 1506    Visit Number  1    Number of Visits  13    Date for PT Re-Evaluation  06/08/18    PT Start Time  7408    PT Stop Time  1445    PT Time Calculation (min)  60 min    Activity Tolerance  Patient tolerated treatment well    Behavior During Therapy  [facility] for tasks assessed/performed       Past Medical History:  Diagnosis Date  . Abnormal Pap smear of cervix 3/15   ASCUS HPV not detected  . ADD (attention deficit disorder)   . ADD (attention deficit disorder)   . Anemia   . Arthritis    fingers  . Breast cancer ([place])    Left; Age 33  . Colitis   . Genital herpes 03/26/2009  . GERD (gastroesophageal reflux disease)   . History of chest pain    Related to reflux  . HSV-2 (herpes simplex virus 2) infection   . Hyperlipidemia   . Hypertension   . Hypothyroidism   . Personal history of chemotherapy   . Personal history of tobacco use, presenting hazards to health 06/26/2015  . S/P appendectomy 1981  . Seborrheic dermatitis   . Vertigo    x1 - approx 5 yrs ago    Past Surgical History:  Procedure Laterality Date  . APPENDECTOMY    . AUGMENTATION MAMMAPLASTY  1982  . AUGMENTATION MAMMAPLASTY Bilateral 1448   silicone gel  . BREAST IMPLANT EXCHANGE Bilateral 04/21/2016   Procedure: REMOVAL OF BREAST IMPLANTS WITH IMMEDIATE REPLACMENTOF BREAST IMPLANTS;  Surgeon: Youlanda Roys, MD;  Location: [place];  Service: Plastics;  Laterality: Bilateral;  . BREAST SURGERY  2005   Breast Implant replaceddue to leaking  . BREAST SURGERY Left 2006   due to asymmetry  . CARDIAC CATHETERIZATION      2011 at [facility] - reflux related  . CERVICAL BIOPSY  W/ LOOP ELECTRODE EXCISION     unsure pathology   . CESAREAN SECTION  1985  . COLONOSCOPY WITH PROPOFOL N/A 06/17/2015   Procedure: COLONOSCOPY WITH PROPOFOL;  Surgeon: Lucilla Lame, MD;  Location: [place];  Service: Endoscopy;  Laterality: N/A;  PT PREFERS EARLY AM  . COLONOSCOPY WITH PROPOFOL N/A 07/16/2016   Procedure: COLONOSCOPY WITH PROPOFOL;  Surgeon: Lucilla Lame, MD;  Location: [place];  Service: Endoscopy;  Laterality: N/A;  requests early as possible  . COSMETIC SURGERY     eyelid surgery  . DILATION AND CURETTAGE OF UTERUS    . ENDOMETRIAL BIOPSY    . MASTECTOMY Left    new implant on the Right  . MASTOPEXY Right 04/21/2016   Procedure: MASTOPEXY;  Surgeon: Youlanda Roys, MD;  Location: [place];  Service: Plastics;  Laterality: Right;  . POLYPECTOMY  06/17/2015   Procedure: POLYPECTOMY;  Surgeon: Lucilla Lame, MD;  Location: [place];  Service: Endoscopy;;  . TONSILLECTOMY  child  . TUBAL LIGATION      There were no vitals filed for this visit.   Subjective Assessment - 04/27/18 1454    Subjective  Pt is a 73 y.o. female referred to PT for B hip trochanteric bursitis. Pt states she has been experiencing this pain almost 2 years go when she went from not walking frequently to walking 2 miles with her neighbors. Pt states her current pain is a 0/10 NPRS and the worst her pain gets is a 5/10 NPRS. Pt reports seeing physician and receiving a corticosteoid injection in both hips and states it was beneficial for her pain. Pt describes her pain mainly occurs when sleeping on her side and that it refers laterally down to her feet depending on side she sleeps on.    Pertinent History  Osteoporosis, hypertension, history of breast cancer    Limitations  Walking;Lifting    How long can you sit comfortably?  Unlimited    How long can you stand comfortably?  Unlimited     How long can you walk comfortably?  ~1 mile    Patient Stated Goals  Return to walking, no pain when sleeping    Currently in Pain?  Yes    Pain Score  5     Pain Location  Hip    Pain Orientation  Right;Left;Distal;Lateral    Pain Descriptors / Indicators  Aching    Pain Radiating Towards  Distal to feet B    Pain Onset  More than a month ago    Pain Frequency  Intermittent    Aggravating Factors   Walking, Sitting to standing, stairs    Pain Relieving Factors  Celebrex, ice, biofreeze    Effect of Pain on Daily Activities  No         [facility] PT Assessment - 04/27/18 0001      Assessment   Medical Diagnosis  Trochanteric bursistis     Referring Provider (PT)  Cameron Hicks    Hand Dominance  Right    Prior Therapy  No      Home Environment   Living Environment  Private residence    Home Access  Stairs to enter    Entrance Stairs-Number of Steps  2 stairs      Prior Function   Level of Independence  Independent       OBJECTIVE  Mental Status Patient is oriented to person, place and time.  Recent memory is intact. .  Expressive speech is intact.  Patient's fund of knowledge is within normal limits for educational level.  SENSATION: Proprioception and hot/cold testing deferred on this date   MUSCULOSKELETAL: Tremor: None Bulk: Normal Tone: Normal  Posture Slight FHP and round shoulders  Gait  Normal  Palpation  TTP B on greater trochanters and vastus lateralis on left LE.   Strength (out of 5) R/L 5/5 Hip flexion 4/4 Hip ER 4/4 Hip IR 4/4 Hip abduction 5/5 Hip adduction 3+/3+ Hip extension 5/5 Knee extension 5/5 Knee flexion *Indicates pain   AROM (degrees) R/L (all movements include overpressure unless otherwise stated) Lumbar forward flexion (0-65): WNL Lumbar extension (0-30): WNL Lumbar lateral flexion (0-25): R: WNL  L: WNL Lumbar rotation: WNL Hip IR (0-45): R: WNL L: WNL Hip ER (0-45): R: WNL L: WNL Hip Flexion (0-125): WNL Hip  Abduction (0-40): R: WNL L: WNL Hip extension (0-15): R: WNL L: WNL *Indicates pain  PROM (degrees) PROM = AROM R/L (all movements include overpressure unless otherwise stated) Lumbar forward flexion (65 degrees): WNL with OP Lumbar extension (30 degrees): WNL with OP Lumbar lateral flexion (25 degrees): R: WNL with OP L: WNL with OP Lumbar  rotation: WNL with OP *Indicates pain   Passive Accessory Motion (PAM) Hip AP: R: WNL L: WNL  SPECIAL TESTS FABER: R: NEGATIVE L: NEGATIVE FADIR: R: NEGATIVE L: NEGATIVE  ELY's Test: R: NEGATIVE L: POSITIVE Deep Squat: NEGATIVE   Objective measurements completed on examination: See above findings.       PT Education - 04/27/18 1505    Education Details  Form/Technique with HEP: walking 3/4 mile every other day and side-lying hip abduction 2x10 every other day    Person(s) Educated  Patient    Methods  Explanation;Demonstration;Handout    Comprehension  Verbalized understanding;Returned demonstration       PT Short Term Goals - 04/27/18 1507      PT SHORT TERM GOAL #1   Title  Pt will have 100% compliance with HEP to improve hip strength and walking tolerance    Baseline  04/27/2018: Side-lying hip abduction and wlkaing 3/4 mile every other day    Time  2    Period  Weeks    Status  New    Target Date  05/11/18        PT Long Term Goals - 04/27/18 1509      PT LONG TERM GOAL #1   Title  Pt will have a decrease of at least 3 points on NPRS when sleeping to demonstrate improvement in therapy.    Baseline  04/27/2018: 5/10 NPRS    Time  6    Period  Weeks    Status  New    Target Date  06/08/18      PT LONG TERM GOAL #2   Title  Pt will be able to walk 2 miles with no pain to return to recreational activities with friends.    Baseline  04/27/2018: 5/10 NPRS when walking 1 mile    Time  6    Period  Weeks    Status  New    Target Date  06/08/18      PT LONG TERM GOAL #3   Title  Patient will demosntrate a worst pain of  1/10 to indicate significant improvement in pain and improvement overall with hip pain    Baseline  3/10    Time  6    Period  Weeks    Status  New    Target Date  06/08/18             Plan - 04/27/18 1513    Clinical Impression Statement  Pt is a 73 y.o. female presenting with B hip pain on greater trochanter that she has had for ~2 years. Current impairments pt has is decreased posterior hip strength, decreased lateral hip strength, and chronic pain. These impairments are inhibiting full participation in walking longer distances for recreational activity with neighbors, ascending and descending stairs, and interfering pt with sleep. Pt would benefit from skilled treatment to address these impairments to return to PLOF.     History and Personal Factors relevant to plan of care:  Hx of smoking, Hx of breast cancer, hypertension    Clinical Presentation  Evolving    Clinical Presentation due to:  Chronic pain    Clinical Decision Making  Moderate    Rehab Potential  Good    Clinical Impairments Affecting Rehab Potential  (+) Motivated, enjoys physical activity (-) Multiple comorbidities, hx of smoking, chronic pain    PT Frequency  2x / week    PT Duration  6 weeks    PT Treatment/Interventions  ADLs/Self Care Home Management;Cryotherapy;Electrical Stimulation;Moist Heat;Traction;Gait training;Stair training;Functional mobility training;Therapeutic activities;Therapeutic exercise;Neuromuscular re-education;Balance training;Patient/family education;Manual techniques;Energy conservation;Spinal Manipulations;Joint Manipulations;Taping    PT Next Visit Plan  Reasses HEP, Hip ER strength,     PT Home Exercise Plan  Walk 3/4 mile, side-lying hip abduction    Consulted and Agree with Plan of Care  Patient       Patient will benefit from skilled therapeutic intervention in order to improve the following deficits and impairments:  Improper body mechanics, Pain, Increased muscle spasms,  Decreased activity tolerance, Decreased endurance, Decreased strength  Visit Diagnosis: Chronic hip pain, bilateral  Muscle weakness (generalized)     Problem List Patient Active Problem List   Diagnosis Date Noted  . Aortic atherosclerosis ([place]) 01/19/2017  . Lymphocytic colitis 12/07/2016  . Noninfectious diarrhea   . Diarrhea of presumed infectious origin 06/26/2016  . Coronary artery disease involving native coronary artery of native heart without angina pectoris 07/22/2015  . Personal history of tobacco use, presenting hazards to health 06/26/2015  . Rectal polyp   . History of smoking 30 or more pack years 05/17/2015  . Personal history of nicotine dependence 05/17/2015  . Constipation 02/13/2015  . Cramp in lower leg 02/12/2015  . Difficulty hearing 02/12/2015  . Acid reflux 02/12/2015  . B12 deficiency 02/12/2015  . Abnormal blood sugar 02/12/2015  . Hearing loss 02/12/2015  . Hypothyroidism 09/10/2014  . Insomnia 09/05/2014  . Other specified postprocedural states 04/18/2013  . S/P breast reconstruction, left 04/18/2013  . Personal history of malignant neoplasm of breast 06/20/2012  . History of abnormal Pap smear 06/20/2012  . Benign hypertension 07/01/2009  . Essential (primary) hypertension 07/01/2009  . Genital herpes 03/26/2009  . 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 Hypercholesterolemia without hypertriglyceridemia 02/21/2009  . Allergic rhinitis 02/21/2009    Jeryl Columbia 04/28/2018, 1:18 PM  [facility] [address], [place], [address] Phone: [phone]   Fax:  [phone]  Name: Adriana Hicks MRN: [id number] Date of Birth: September 29, 1945

## 2018-05-03 ENCOUNTER — Ambulatory Visit: Payer: Federal, State, Local not specified - PPO

## 2018-05-03 DIAGNOSIS — G8929 Other chronic pain: Secondary | ICD-10-CM

## 2018-05-03 DIAGNOSIS — M25551 Pain in right hip: Secondary | ICD-10-CM | POA: Diagnosis not present

## 2018-05-03 DIAGNOSIS — M25552 Pain in left hip: Principal | ICD-10-CM

## 2018-05-03 DIAGNOSIS — M6281 Muscle weakness (generalized): Secondary | ICD-10-CM

## 2018-05-03 NOTE — Therapy (Signed)
[facility] [address] [place], [address] Phone: [phone]   Fax:  [phone]  Physical Therapy Treatment  Patient Details  Name: Adriana Hicks MRN: [id number] Date of Birth: 1945-07-31 Referring Provider (PT): [name]   Encounter Date: 05/03/2018  PT End of Session - 05/03/18 1044    Visit Number  2    Number of Visits  13    Date for PT Re-Evaluation  06/08/18    PT Start Time  0848    PT Stop Time  0930    PT Time Calculation (min)  42 min    Activity Tolerance  Patient tolerated treatment well    Behavior During Therapy  [facility] for tasks assessed/performed       Past Medical History:  Diagnosis Date  . Abnormal Pap smear of cervix 3/15   ASCUS HPV not detected  . ADD (attention deficit disorder)   . ADD (attention deficit disorder)   . Anemia   . Arthritis    fingers  . Breast cancer ([place])    Left; Age 63  . Colitis   . Genital herpes 03/26/2009  . GERD (gastroesophageal reflux disease)   . History of chest pain    Related to reflux  . HSV-2 (herpes simplex virus 2) infection   . Hyperlipidemia   . Hypertension   . Hypothyroidism   . Personal history of chemotherapy   . Personal history of tobacco use, presenting hazards to health 06/26/2015  . S/P appendectomy 1981  . Seborrheic dermatitis   . Vertigo    x1 - approx 5 yrs ago    Past Surgical History:  Procedure Laterality Date  . APPENDECTOMY    . AUGMENTATION MAMMAPLASTY  1982  . AUGMENTATION MAMMAPLASTY Bilateral 5176   silicone gel  . BREAST IMPLANT EXCHANGE Bilateral 04/21/2016   Procedure: REMOVAL OF BREAST IMPLANTS WITH IMMEDIATE REPLACMENTOF BREAST IMPLANTS;  Surgeon: Youlanda Roys, MD;  Location: [place];  Service: Plastics;  Laterality: Bilateral;  . BREAST SURGERY  2005   Breast Implant replaceddue to leaking  . BREAST SURGERY Left 2006   due to asymmetry  . CARDIAC CATHETERIZATION      2011 at [facility] - reflux related  . CERVICAL BIOPSY  W/ LOOP ELECTRODE EXCISION     unsure pathology   . CESAREAN SECTION  1985  . COLONOSCOPY WITH PROPOFOL N/A 06/17/2015   Procedure: COLONOSCOPY WITH PROPOFOL;  Surgeon: Lucilla Lame, MD;  Location: [place];  Service: Endoscopy;  Laterality: N/A;  PT PREFERS EARLY AM  . COLONOSCOPY WITH PROPOFOL N/A 07/16/2016   Procedure: COLONOSCOPY WITH PROPOFOL;  Surgeon: Lucilla Lame, MD;  Location: [place];  Service: Endoscopy;  Laterality: N/A;  requests early as possible  . COSMETIC SURGERY     eyelid surgery  . DILATION AND CURETTAGE OF UTERUS    . ENDOMETRIAL BIOPSY    . MASTECTOMY Left    new implant on the Right  . MASTOPEXY Right 04/21/2016   Procedure: MASTOPEXY;  Surgeon: Youlanda Roys, MD;  Location: [place];  Service: Plastics;  Laterality: Right;  . POLYPECTOMY  06/17/2015   Procedure: POLYPECTOMY;  Surgeon: Lucilla Lame, MD;  Location: [place];  Service: Endoscopy;;  . TONSILLECTOMY  child  . TUBAL LIGATION      There were no vitals filed for this visit.  Subjective Assessment - 05/03/18 0851    Subjective  Pt reports not performing HEP of side-lying abuction but has been walking everyday. Pt reports "overdoing it" with the walking last Fiday with an increase in pain later that evening. Pt reports increased pain with sleeping on their side.     Pertinent History  Osteoporosis, hypertension, history of breast cancer    Limitations  Walking;Lifting    How long can you sit comfortably?  Unlimited    How long can you stand comfortably?  Unlimited    How long can you walk comfortably?  ~1 mile    Patient Stated Goals  Return to walking, no pain when sleeping    Pain Score  2     Pain Location  Hip    Pain Orientation  Right;Left;Lateral    Pain Descriptors / Indicators  Aching    Pain Onset  More than a month ago     TREATMENT  THERAPEUTIC EXERCISE   Seated hip  ER with TB (Red): 2x15  B Standing Hip abduction with TB (Yellow): 2x12  Supine glute bridges with TB (Green) around knees for : 1x12  B hip extension machine: 2x15, 55 lbs  Body Squats with no UE support with theraband around knees as a cue to reduce knee valgus: 2x10   Ascending/Descending stairs: 2x down and back (4 steps):    Therapeutic exercises focused on hip ER, extension, and abduction strength. Pt displayed no increase in hip pain after therex. Pt required use of TB for cueing to prevent knee valgus, which indicates hip weakness. Pt displayed minor Trendelenburg when ascending descending stairs. Stairs assessed because pt states having B/L hip pain when ascending and descending stairs. No reports of hip pain with ascending and descending 4 steps at clinic. Pt reports pain with stairs when ascending/descending flights of stairs. Continuing to focus on hip strength to remove Trendelenburg gait when ascending/descending stairs.  PT Education - 05/03/18 1043    Education Details  Form/technique with exercise. How to perform hip abduction in standing for HEP.    Person(s) Educated  Patient    Methods  Explanation;Demonstration;Tactile cues;Verbal cues    Comprehension  Verbalized understanding;Returned demonstration       PT Short Term Goals - 04/27/18 1507      PT SHORT TERM GOAL #1   Title  Pt will have 100% compliance with HEP to improve hip strength and walking tolerance    Baseline  04/27/2018: Side-lying hip abduction and wlkaing 3/4 mile every other day    Time  2    Period  Weeks    Status  New    Target Date  05/11/18        PT Long Term Goals - 04/27/18 1509      PT LONG TERM GOAL #1   Title  Pt will have a decrease of at least 3 points on NPRS when sleeping to demonstrate improvement in therapy.    Baseline  04/27/2018: 5/10 NPRS    Time  6    Period  Weeks    Status  New    Target Date  06/08/18      PT LONG TERM GOAL #2   Title  Pt will be able to walk 2 miles  with no pain to return to recreational activities with friends.    Baseline  04/27/2018: 5/10 NPRS when walking 1 mile    Time  6    Period  Weeks    Status  New    Target Date  06/08/18  PT LONG TERM GOAL #3   Title  Patient will demosntrate a worst pain of 1/10 to indicate significant improvement in pain and improvement overall with hip pain    Baseline  3/10    Time  6    Period  Weeks    Status  New    Target Date  06/08/18            Plan - 05/03/18 1045    Clinical Impression Statement  Pt performed hip stregnthening exercises with no increase in hip pain. Pt required moderate verbal and tactile cueing during hip abduction and hip extension exercises with improved carryover after. Pt continues to display muscle weakness in hip ER, hip extension, and hip abduction. Pt can continue to benefit from skilled PT treatment to return pt to PLOF.    Rehab Potential  Good    Clinical Impairments Affecting Rehab Potential  (+) Motivated, enjoys physical activity (-) Multiple comorbidities, hx of smoking, chronic pain    PT Frequency  2x / week    PT Duration  6 weeks    PT Treatment/Interventions  ADLs/Self Care Home Management;Cryotherapy;Electrical Stimulation;Moist Heat;Traction;Gait training;Stair training;Functional mobility training;Therapeutic activities;Therapeutic exercise;Neuromuscular re-education;Balance training;Patient/family education;Manual techniques;Energy conservation;Spinal Manipulations;Joint Manipulations;Taping    PT Next Visit Plan  Reasses HEP, Hip ER strength,     PT Home Exercise Plan  Walk 3/4 mile, side-lying hip abduction    Consulted and Agree with Plan of Care  Patient       Patient will benefit from skilled therapeutic intervention in order to improve the following deficits and impairments:  Improper body mechanics, Pain, Increased muscle spasms, Decreased activity tolerance, Decreased endurance, Decreased strength  Visit Diagnosis: Chronic hip  pain, bilateral  Muscle weakness (generalized)     Problem List Patient Active Problem List   Diagnosis Date Noted  . Aortic atherosclerosis ([place]) 01/19/2017  . Lymphocytic colitis 12/07/2016  . Noninfectious diarrhea   . Diarrhea of presumed infectious origin 06/26/2016  . Coronary artery disease involving native coronary artery of native heart without angina pectoris 07/22/2015  . Personal history of tobacco use, presenting hazards to health 06/26/2015  . Rectal polyp   . History of smoking 30 or more pack years 05/17/2015  . Personal history of nicotine dependence 05/17/2015  . Constipation 02/13/2015  . Cramp in lower leg 02/12/2015  . Difficulty hearing 02/12/2015  . Acid reflux 02/12/2015  . B12 deficiency 02/12/2015  . Abnormal blood sugar 02/12/2015  . Hearing loss 02/12/2015  . Hypothyroidism 09/10/2014  . Insomnia 09/05/2014  . Other specified postprocedural states 04/18/2013  . S/P breast reconstruction, left 04/18/2013  . Personal history of malignant neoplasm of breast 06/20/2012  . History of abnormal Pap smear 06/20/2012  . Benign hypertension 07/01/2009  . Essential (primary) hypertension 07/01/2009  . Genital herpes 03/26/2009  . 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 Hypercholesterolemia without hypertriglyceridemia 02/21/2009  . Allergic rhinitis 02/21/2009    Rachael Fee, SPT 05/03/2018, 10:51 AM  [facility] [address], [place], [address] Phone: [phone]   Fax:  [phone]  Name: Adriana Hicks MRN: [id number] Date of Birth: 1945/08/06

## 2018-05-05 ENCOUNTER — Ambulatory Visit: Payer: Federal, State, Local not specified - PPO

## 2018-05-05 DIAGNOSIS — M6281 Muscle weakness (generalized): Secondary | ICD-10-CM

## 2018-05-05 DIAGNOSIS — M25551 Pain in right hip: Secondary | ICD-10-CM | POA: Diagnosis not present

## 2018-05-05 DIAGNOSIS — M25552 Pain in left hip: Principal | ICD-10-CM

## 2018-05-05 DIAGNOSIS — G8929 Other chronic pain: Secondary | ICD-10-CM

## 2018-05-05 NOTE — Therapy (Signed)
[facility] [address], [place], [address] Phone: [phone]   Fax:  [phone]  Physical Therapy Treatment  Patient Details  Name: Adriana Hicks MRN: [id number] Date of Birth: Jan 12, 1946 Referring Provider (PT): [name]   Encounter Date: 05/05/2018  PT End of Session - 05/05/18 0908    Visit Number  3    Number of Visits  13    Date for PT Re-Evaluation  06/08/18    PT Start Time  0800    PT Stop Time  0845    PT Time Calculation (min)  45 min    Activity Tolerance  Patient tolerated treatment well    Behavior During Therapy  [facility] for tasks assessed/performed       Past Medical History:  Diagnosis Date  . Abnormal Pap smear of cervix 3/15   ASCUS HPV not detected  . ADD (attention deficit disorder)   . ADD (attention deficit disorder)   . Anemia   . Arthritis    fingers  . Breast cancer ([facility])    Left; Age 10  . Colitis   . Genital herpes 03/26/2009  . GERD (gastroesophageal reflux disease)   . History of chest pain    Related to reflux  . HSV-2 (herpes simplex virus 2) infection   . Hyperlipidemia   . Hypertension   . Hypothyroidism   . Personal history of chemotherapy   . Personal history of tobacco use, presenting hazards to health 06/26/2015  . S/P appendectomy 1981  . Seborrheic dermatitis   . Vertigo    x1 - approx 5 yrs ago    Past Surgical History:  Procedure Laterality Date  . APPENDECTOMY    . AUGMENTATION MAMMAPLASTY  1982  . AUGMENTATION MAMMAPLASTY Bilateral 5520   silicone gel  . BREAST IMPLANT EXCHANGE Bilateral 04/21/2016   Procedure: REMOVAL OF BREAST IMPLANTS WITH IMMEDIATE REPLACMENTOF BREAST IMPLANTS;  Surgeon: Youlanda Roys, MD;  Location: [place];  Service: Plastics;  Laterality: Bilateral;  . BREAST SURGERY  2005   Breast Implant replaceddue to leaking  . BREAST SURGERY Left 2006   due to asymmetry  . CARDIAC CATHETERIZATION      2011 at [facility] - reflux related  . CERVICAL BIOPSY  W/ LOOP ELECTRODE EXCISION     unsure pathology   . CESAREAN SECTION  1985  . COLONOSCOPY WITH PROPOFOL N/A 06/17/2015   Procedure: COLONOSCOPY WITH PROPOFOL;  Surgeon: Lucilla Lame, MD;  Location: [place];  Service: Endoscopy;  Laterality: N/A;  PT PREFERS EARLY AM  . COLONOSCOPY WITH PROPOFOL N/A 07/16/2016   Procedure: COLONOSCOPY WITH PROPOFOL;  Surgeon: Lucilla Lame, MD;  Location: [place];  Service: Endoscopy;  Laterality: N/A;  requests early as possible  . COSMETIC SURGERY     eyelid surgery  . DILATION AND CURETTAGE OF UTERUS    . ENDOMETRIAL BIOPSY    . MASTECTOMY Left    new implant on the Right  . MASTOPEXY Right 04/21/2016   Procedure: MASTOPEXY;  Surgeon: Youlanda Roys, MD;  Location: [place];  Service: Plastics;  Laterality: Right;  . POLYPECTOMY  06/17/2015   Procedure: POLYPECTOMY;  Surgeon: Lucilla Lame, MD;  Location: [place];  Service: Endoscopy;;  . TONSILLECTOMY  child  . TUBAL LIGATION      There were no vitals filed for this visit.  Subjective Assessment - 05/05/18 0819    Subjective  Patient reports increased soreness after the previous session. Patient states the soreness was along her hips.     Pertinent History  Osteoporosis, hypertension, history of breast cancer    Limitations  Walking;Lifting    How long can you sit comfortably?  Unlimited    How long can you stand comfortably?  Unlimited    How long can you walk comfortably?  ~1 mile    Patient Stated Goals  Return to walking, no pain when sleeping    Currently in Pain?  Yes    Pain Score  2     Pain Location  Hip    Pain Orientation  Right;Left;Lateral    Pain Descriptors / Indicators  Aching    Pain Type  Chronic pain    Pain Onset  More than a month ago    Pain Frequency  Intermittent        TREATMENT  THERAPEUTIC EXERCISE  Knees to chest with feet on the ball - x 1  min  LTRs with feet on ball - x1 min Bridges with belt around knees  -- x20 Deadbug in hooklying - x 20 Single leg stance on airex pad - x 20 Leg Press at [employer] - x 13 65#  Tandem stance on airex beam over hurdles - x 10 Single leg ball toss against rebounder - x 15 B Side stepping on airex beam over hurdles - x 10 down and back  Focused on greater amount of mobility and balance exercises to decrease pain and spasms and to not aggravate soreness along her hip musculature. Patient demonstrates overall improvement with standing exercises.   PT Education - 05/05/18 0907    Education Details  form/technique with exercise    Person(s) Educated  Patient    Methods  Explanation;Demonstration    Comprehension  Verbalized understanding;Returned demonstration       PT Short Term Goals - 04/27/18 1507      PT SHORT TERM GOAL #1   Title  Pt will have 100% compliance with HEP to improve hip strength and walking tolerance    Baseline  04/27/2018: Side-lying hip abduction and wlkaing 3/4 mile every other day    Time  2    Period  Weeks    Status  New    Target Date  05/11/18        PT Long Term Goals - 04/27/18 1509      PT LONG TERM GOAL #1   Title  Pt will have a decrease of at least 3 points on NPRS when sleeping to demonstrate improvement in therapy.    Baseline  04/27/2018: 5/10 NPRS    Time  6    Period  Weeks    Status  New    Target Date  06/08/18      PT LONG TERM GOAL #2   Title  Pt will be able to walk 2 miles with no pain to return to recreational activities with friends.    Baseline  04/27/2018: 5/10 NPRS when walking 1 mile    Time  6    Period  Weeks    Status  New    Target Date  06/08/18      PT LONG TERM GOAL #3   Title  Patient will demosntrate a worst pain of 1/10 to indicate significant improvement in pain and improvement overall with hip pain    Baseline  3/10    Time  6    Period  Weeks  Status  New    Target Date  06/08/18            Plan -  05/05/18 0908    Clinical Impression Statement  Patient demonstrates no increase in pain or soreness with exercise. Educated patient on progression of exercise performance during her recreational exercise activity. Patient continues to have light pain along her hips B. Patient also demonstrates decreased single leg balance requiring intermittent UE support to perform. Patient will benefit from further skilled therapy to return to prior level of function.     Rehab Potential  Good    Clinical Impairments Affecting Rehab Potential  (+) Motivated, enjoys physical activity (-) Multiple comorbidities, hx of smoking, chronic pain    PT Frequency  2x / week    PT Duration  6 weeks    PT Treatment/Interventions  ADLs/Self Care Home Management;Cryotherapy;Electrical Stimulation;Moist Heat;Traction;Gait training;Stair training;Functional mobility training;Therapeutic activities;Therapeutic exercise;Neuromuscular re-education;Balance training;Patient/family education;Manual techniques;Energy conservation;Spinal Manipulations;Joint Manipulations;Taping    PT Next Visit Plan  Reasses HEP, Hip ER strength,     PT Home Exercise Plan  Walk 3/4 mile, side-lying hip abduction    Consulted and Agree with Plan of Care  Patient       Patient will benefit from skilled therapeutic intervention in order to improve the following deficits and impairments:  Improper body mechanics, Pain, Increased muscle spasms, Decreased activity tolerance, Decreased endurance, Decreased strength  Visit Diagnosis: Chronic hip pain, bilateral  Muscle weakness (generalized)     Problem List Patient Active Problem List   Diagnosis Date Noted  . Aortic atherosclerosis ([facility]) 01/19/2017  . Lymphocytic colitis 12/07/2016  . Noninfectious diarrhea   . Diarrhea of presumed infectious origin 06/26/2016  . Coronary artery disease involving native coronary artery of native heart without angina pectoris 07/22/2015  . Personal history of  tobacco use, presenting hazards to health 06/26/2015  . Rectal polyp   . History of smoking 30 or more pack years 05/17/2015  . Personal history of nicotine dependence 05/17/2015  . Constipation 02/13/2015  . Cramp in lower leg 02/12/2015  . Difficulty hearing 02/12/2015  . Acid reflux 02/12/2015  . B12 deficiency 02/12/2015  . Abnormal blood sugar 02/12/2015  . Hearing loss 02/12/2015  . Hypothyroidism 09/10/2014  . Insomnia 09/05/2014  . Other specified postprocedural states 04/18/2013  . S/P breast reconstruction, left 04/18/2013  . Personal history of malignant neoplasm of breast 06/20/2012  . History of abnormal Pap smear 06/20/2012  . Benign hypertension 07/01/2009  . Essential (primary) hypertension 07/01/2009  . Genital herpes 03/26/2009  . 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 Hypercholesterolemia without hypertriglyceridemia 02/21/2009  . Allergic rhinitis 02/21/2009    Blythe Stanford, PT DPT 05/05/2018, 9:12 AM  [facility] [address], [place], [address] Phone: [phone]   Fax:  [phone]  Name: Adriana Hicks MRN: [id number] Date of Birth: July 16, 1945

## 2018-05-07 ENCOUNTER — Ambulatory Visit
Admission: RE | Admit: 2018-05-07 | Discharge: 2018-05-07 | Disposition: A | Discharge status: Home / Self Care | Payer: Federal, State, Local not specified - PPO | Source: Ambulatory Visit | Attending: Student | Admitting: Student

## 2018-05-07 DIAGNOSIS — M7061 Trochanteric bursitis, right hip: Secondary | ICD-10-CM | POA: Diagnosis present

## 2018-05-07 DIAGNOSIS — M7062 Trochanteric bursitis, left hip: Secondary | ICD-10-CM

## 2018-05-10 ENCOUNTER — Ambulatory Visit: Payer: Federal, State, Local not specified - PPO

## 2018-05-10 DIAGNOSIS — G8929 Other chronic pain: Secondary | ICD-10-CM

## 2018-05-10 DIAGNOSIS — M25551 Pain in right hip: Principal | ICD-10-CM

## 2018-05-10 DIAGNOSIS — M6281 Muscle weakness (generalized): Secondary | ICD-10-CM

## 2018-05-10 DIAGNOSIS — M25552 Pain in left hip: Principal | ICD-10-CM

## 2018-05-10 NOTE — Therapy (Signed)
[facility] [address] [place], [address] Phone: [phone]   Fax:  [phone]  Physical Therapy Treatment  Patient Details  Name: Adriana Hicks MRN: [id number] Date of Birth: 25-Nov-1945 Referring Provider (PT): [name]   Encounter Date: 05/10/2018  PT End of Session - 05/10/18 1126    Visit Number  4    Number of Visits  13    Date for PT Re-Evaluation  06/08/18    PT Start Time  0945    PT Stop Time  1030    PT Time Calculation (min)  45 min    Activity Tolerance  Patient tolerated treatment well    Behavior During Therapy  [facility] for tasks assessed/performed       Past Medical History:  Diagnosis Date  . Abnormal Pap smear of cervix 3/15   ASCUS HPV not detected  . ADD (attention deficit disorder)   . ADD (attention deficit disorder)   . Anemia   . Arthritis    fingers  . Breast cancer ([facility])    Left; Age 55  . Colitis   . Genital herpes 03/26/2009  . GERD (gastroesophageal reflux disease)   . History of chest pain    Related to reflux  . HSV-2 (herpes simplex virus 2) infection   . Hyperlipidemia   . Hypertension   . Hypothyroidism   . Personal history of chemotherapy   . Personal history of tobacco use, presenting hazards to health 06/26/2015  . S/P appendectomy 1981  . Seborrheic dermatitis   . Vertigo    x1 - approx 5 yrs ago    Past Surgical History:  Procedure Laterality Date  . APPENDECTOMY    . AUGMENTATION MAMMAPLASTY  1982  . AUGMENTATION MAMMAPLASTY Bilateral 9030   silicone gel  . BREAST IMPLANT EXCHANGE Bilateral 04/21/2016   Procedure: REMOVAL OF BREAST IMPLANTS WITH IMMEDIATE REPLACMENTOF BREAST IMPLANTS;  Surgeon: Youlanda Roys, MD;  Location: [place];  Service: Plastics;  Laterality: Bilateral;  . BREAST SURGERY  2005   Breast Implant replaceddue to leaking  . BREAST SURGERY Left 2006   due to asymmetry  . CARDIAC CATHETERIZATION      2011 at [facility] - reflux related  . CERVICAL BIOPSY  W/ LOOP ELECTRODE EXCISION     unsure pathology   . CESAREAN SECTION  1985  . COLONOSCOPY WITH PROPOFOL N/A 06/17/2015   Procedure: COLONOSCOPY WITH PROPOFOL;  Surgeon: Lucilla Lame, MD;  Location: [place];  Service: Endoscopy;  Laterality: N/A;  PT PREFERS EARLY AM  . COLONOSCOPY WITH PROPOFOL N/A 07/16/2016   Procedure: COLONOSCOPY WITH PROPOFOL;  Surgeon: Lucilla Lame, MD;  Location: [place];  Service: Endoscopy;  Laterality: N/A;  requests early as possible  . COSMETIC SURGERY     eyelid surgery  . DILATION AND CURETTAGE OF UTERUS    . ENDOMETRIAL BIOPSY    . MASTECTOMY Left    new implant on the Right  . MASTOPEXY Right 04/21/2016   Procedure: MASTOPEXY;  Surgeon: Youlanda Roys, MD;  Location: [place];  Service: Plastics;  Laterality: Right;  . POLYPECTOMY  06/17/2015   Procedure: POLYPECTOMY;  Surgeon: Lucilla Lame, MD;  Location: [place];  Service: Endoscopy;;  . TONSILLECTOMY  child  . TUBAL LIGATION      There were no vitals filed for this visit.  Subjective Assessment - 05/10/18 0947    Subjective  Pt reports "nervousness" with MRI results from doctor on her hips. Pt reports taking Celebrex before bed to help her sleep through the night. Pt reports sleeping with pillow between knees has helped.     Pertinent History  Osteoporosis, hypertension, history of breast cancer    Limitations  Walking;Lifting    How long can you sit comfortably?  Unlimited    How long can you stand comfortably?  Unlimited    How long can you walk comfortably?  ~1 mile    Patient Stated Goals  Return to walking, no pain when sleeping    Currently in Pain?  No/denies    Pain Onset  More than a month ago     TREATMENT  THERAPEUTIC EXERCISE  B standing hip abduction with TB (Yellow): 2x15  B standing hip extension machine: 2x15, 55 lbs   B lateral 8" step up: 2x15  B 8"  step-up: 2x15  Seated hip ER with TB (Red): 2x15  Seated hip IR with TB (Red) with ball between knees for tactile cuing to target correct musculature: 2x15  B Single leg stance med ball toss on rebounder: 1x10  Therapeutic exercises to improve glut medius and glut minimus strength. Step-up exercises and hip hike exercises reproduced pt's lateral hip pain meaning correct muscles were targeted. Improving B hip strength to decrease pain and improve pt's function with ADL's such as ascending/descending stairs and returning to long distance walking.   PT Education - 05/10/18 1125    Education Details  Form/technique with exercise.     Person(s) Educated  Patient    Methods  Explanation;Demonstration    Comprehension  Verbalized understanding;Returned demonstration       PT Short Term Goals - 04/27/18 1507      PT SHORT TERM GOAL #1   Title  Pt will have 100% compliance with HEP to improve hip strength and walking tolerance    Baseline  04/27/2018: Side-lying hip abduction and wlkaing 3/4 mile every other day    Time  2    Period  Weeks    Status  New    Target Date  05/11/18        PT Long Term Goals - 04/27/18 1509      PT LONG TERM GOAL #1   Title  Pt will have a decrease of at least 3 points on NPRS when sleeping to demonstrate improvement in therapy.    Baseline  04/27/2018: 5/10 NPRS    Time  6    Period  Weeks    Status  New    Target Date  06/08/18      PT LONG TERM GOAL #2   Title  Pt will be able to walk 2 miles with no pain to return to recreational activities with friends.    Baseline  04/27/2018: 5/10 NPRS when walking 1 mile    Time  6    Period  Weeks    Status  New    Target Date  06/08/18      PT LONG TERM GOAL #3   Title  Patient will demosntrate a worst pain of 1/10 to indicate significant improvement in pain and improvement overall with hip pain    Baseline  3/10    Time  6    Period  Weeks    Status  New    Target Date  06/08/18            Plan -  05/10/18 1127  Clinical Impression Statement  Pt had no increase in hip soreness with today's treatment. Lateral hip raises and step-ups reproduced pt's lateral hip pain she has been experiencing, thus displaying exercises targeting correct musculature. Pt continues to display decreased single leg stance time requiring other LE touching ground for balance in order to perform. Pt can continue to benefit from skilled PT treatment to improve hip strength to decrease pain and to return pt to PLOF.    Rehab Potential  Good    Clinical Impairments Affecting Rehab Potential  (+) Motivated, enjoys physical activity (-) Multiple comorbidities, hx of smoking, chronic pain    PT Frequency  2x / week    PT Duration  6 weeks    PT Treatment/Interventions  ADLs/Self Care Home Management;Cryotherapy;Electrical Stimulation;Moist Heat;Traction;Gait training;Stair training;Functional mobility training;Therapeutic activities;Therapeutic exercise;Neuromuscular re-education;Balance training;Patient/family education;Manual techniques;Energy conservation;Spinal Manipulations;Joint Manipulations;Taping    PT Next Visit Plan  Reasses HEP, Hip ER strength,     PT Home Exercise Plan  Walk 3/4 mile, side-lying hip abduction    Consulted and Agree with Plan of Care  Patient       Patient will benefit from skilled therapeutic intervention in order to improve the following deficits and impairments:  Improper body mechanics, Pain, Increased muscle spasms, Decreased activity tolerance, Decreased endurance, Decreased strength  Visit Diagnosis: Chronic hip pain, bilateral  Muscle weakness (generalized)     Problem List Patient Active Problem List   Diagnosis Date Noted  . Aortic atherosclerosis ([facility]) 01/19/2017  . Lymphocytic colitis 12/07/2016  . Noninfectious diarrhea   . Diarrhea of presumed infectious origin 06/26/2016  . Coronary artery disease involving native coronary artery of native heart without angina  pectoris 07/22/2015  . Personal history of tobacco use, presenting hazards to health 06/26/2015  . Rectal polyp   . History of smoking 30 or more pack years 05/17/2015  . Personal history of nicotine dependence 05/17/2015  . Constipation 02/13/2015  . Cramp in lower leg 02/12/2015  . Difficulty hearing 02/12/2015  . Acid reflux 02/12/2015  . B12 deficiency 02/12/2015  . Abnormal blood sugar 02/12/2015  . Hearing loss 02/12/2015  . Hypothyroidism 09/10/2014  . Insomnia 09/05/2014  . Other specified postprocedural states 04/18/2013  . S/P breast reconstruction, left 04/18/2013  . Personal history of malignant neoplasm of breast 06/20/2012  . History of abnormal Pap smear 06/20/2012  . Benign hypertension 07/01/2009  . Essential (primary) hypertension 07/01/2009  . Genital herpes 03/26/2009  . 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 Hypercholesterolemia without hypertriglyceridemia 02/21/2009  . Allergic rhinitis 02/21/2009    Rachael Fee, SPT 05/10/2018, 11:32 AM  [facility] [address] [place], [address] Phone: [phone]   Fax:  [phone]  Name: Adriana Hicks MRN: [id number] Date of Birth: 06-Dec-1945

## 2018-05-12 ENCOUNTER — Ambulatory Visit: Payer: Federal, State, Local not specified - PPO

## 2018-05-12 DIAGNOSIS — M25551 Pain in right hip: Principal | ICD-10-CM

## 2018-05-12 DIAGNOSIS — G8929 Other chronic pain: Secondary | ICD-10-CM

## 2018-05-12 DIAGNOSIS — M25552 Pain in left hip: Principal | ICD-10-CM

## 2018-05-12 DIAGNOSIS — M6281 Muscle weakness (generalized): Secondary | ICD-10-CM

## 2018-05-12 NOTE — Therapy (Signed)
[facility] [address] [place], [address] Phone: [phone]   Fax:  [phone]  Physical Therapy Treatment  Patient Details  Name: Adriana Hicks MRN: [id number] Date of Birth: 1945-06-15 Referring Provider (PT): [name]   Encounter Date: 05/12/2018  PT End of Session - 05/12/18 0957    Visit Number  5    Number of Visits  13    Date for PT Re-Evaluation  06/08/18    PT Start Time  0900    PT Stop Time  0945    PT Time Calculation (min)  45 min    Activity Tolerance  Patient tolerated treatment well    Behavior During Therapy  [facility] for tasks assessed/performed       Past Medical History:  Diagnosis Date  . Abnormal Pap smear of cervix 3/15   ASCUS HPV not detected  . ADD (attention deficit disorder)   . ADD (attention deficit disorder)   . Anemia   . Arthritis    fingers  . Breast cancer ([place])    Left; Age 32  . Colitis   . Genital herpes 03/26/2009  . GERD (gastroesophageal reflux disease)   . History of chest pain    Related to reflux  . HSV-2 (herpes simplex virus 2) infection   . Hyperlipidemia   . Hypertension   . Hypothyroidism   . Personal history of chemotherapy   . Personal history of tobacco use, presenting hazards to health 06/26/2015  . S/P appendectomy 1981  . Seborrheic dermatitis   . Vertigo    x1 - approx 5 yrs ago    Past Surgical History:  Procedure Laterality Date  . APPENDECTOMY    . AUGMENTATION MAMMAPLASTY  1982  . AUGMENTATION MAMMAPLASTY Bilateral 7564   silicone gel  . BREAST IMPLANT EXCHANGE Bilateral 04/21/2016   Procedure: REMOVAL OF BREAST IMPLANTS WITH IMMEDIATE REPLACMENTOF BREAST IMPLANTS;  Surgeon: Youlanda Roys, MD;  Location: [place];  Service: Plastics;  Laterality: Bilateral;  . BREAST SURGERY  2005   Breast Implant replaceddue to leaking  . BREAST SURGERY Left 2006   due to asymmetry  . CARDIAC CATHETERIZATION      2011 at [facility] - reflux related  . CERVICAL BIOPSY  W/ LOOP ELECTRODE EXCISION     unsure pathology   . CESAREAN SECTION  1985  . COLONOSCOPY WITH PROPOFOL N/A 06/17/2015   Procedure: COLONOSCOPY WITH PROPOFOL;  Surgeon: Lucilla Lame, MD;  Location: [place];  Service: Endoscopy;  Laterality: N/A;  PT PREFERS EARLY AM  . COLONOSCOPY WITH PROPOFOL N/A 07/16/2016   Procedure: COLONOSCOPY WITH PROPOFOL;  Surgeon: Lucilla Lame, MD;  Location: [place];  Service: Endoscopy;  Laterality: N/A;  requests early as possible  . COSMETIC SURGERY     eyelid surgery  . DILATION AND CURETTAGE OF UTERUS    . ENDOMETRIAL BIOPSY    . MASTECTOMY Left    new implant on the Right  . MASTOPEXY Right 04/21/2016   Procedure: MASTOPEXY;  Surgeon: Youlanda Roys, MD;  Location: [place];  Service: Plastics;  Laterality: Right;  . POLYPECTOMY  06/17/2015   Procedure: POLYPECTOMY;  Surgeon: Lucilla Lame, MD;  Location: [place];  Service: Endoscopy;;  . TONSILLECTOMY  child  . TUBAL LIGATION      There were no vitals filed for this visit.  Subjective Assessment - 05/12/18 0953    Subjective  Pt states having soreness from previous session. Pt asked PT and Adriana Hicks about PRP therapy and local doctors that perform this therapy. Pt states not performing HEP.    Pertinent History  Osteoporosis, hypertension, history of breast cancer    Limitations  Walking;Lifting    How long can you sit comfortably?  Unlimited    How long can you stand comfortably?  Unlimited    How long can you walk comfortably?  ~1 mile    Patient Stated Goals  Return to walking, no pain when sleeping    Currently in Pain?  No/denies    Pain Onset  More than a month ago     TREATMENT  THERAPEUTIC EXERCISE Knees to chest on physioball: 2 min  LTR's on physioball: 2 min  Leg press: 2x12, 65 lbs  Single leg glut bridges: 1x20  Hip hike on stairs with mod tactile cues: 2x12  B  Step-ups 8": 1x12  B Lateral step-ups 8" step: 1x10  Side-stepping with TB (Red) around ankles: down and back x1 by 20 feet   Therapeutic exercises to target bilateral glut med musculature to improve motor control and strength. Mod tactile cueing required throughout exercise to target appropriate musculature with single leg activities. Pt displayed decreased ability to stand on left leg with hip drop displaying increased weakness of left lateral hips compared to right side.  PT Education - 05/12/18 0955    Education Details  Form/technique with exercise. Educated on arthritis, bursitis, and tendinitis and how it would not change PT treatment and that the research displays PT is very beneficial for all of these conditions. Mod tactile and verbal cueing required for hip hike exercises to target correct musculature.    Person(s) Educated  Patient    Methods  Explanation;Demonstration    Comprehension  Verbalized understanding;Returned demonstration;Tactile cues required;Need further instruction       PT Short Term Goals - 04/27/18 1507      PT SHORT TERM GOAL #1   Title  Pt will have 100% compliance with HEP to improve hip strength and walking tolerance    Baseline  04/27/2018: Side-lying hip abduction and wlkaing 3/4 mile every other day    Time  2    Period  Weeks    Status  New    Target Date  05/11/18        PT Long Term Goals - 04/27/18 1509      PT LONG TERM GOAL #1   Title  Pt will have a decrease of at least 3 points on NPRS when sleeping to demonstrate improvement in therapy.    Baseline  04/27/2018: 5/10 NPRS    Time  6    Period  Weeks    Status  New    Target Date  06/08/18      PT LONG TERM GOAL #2   Title  Pt will be able to walk 2 miles with no pain to return to recreational activities with friends.    Baseline  04/27/2018: 5/10 NPRS when walking 1 mile    Time  6    Period  Weeks    Status  New    Target Date  06/08/18      PT LONG TERM GOAL #3   Title  Patient  will demosntrate a worst pain of 1/10 to indicate significant improvement in pain and improvement overall with hip pain    Baseline  3/10    Time  6    Period  Weeks    Status  New    Target Date  06/08/18            Plan - 05/12/18 0957    Clinical Impression Statement  Pt demonstrated improved ability to perform exercises despite having muscle soreness from previous session. Pt displays increased weakness of left hip musculature with stating more difficulty with exercises on that side along with noticeable decreased stance time and hip drop on left hip when performing single leg acitvities. Pt can continue to benefit from skilled PT treatment to improve hip strength and decrease hip pain to return pt to PLOF.    Rehab Potential  Good    Clinical Impairments Affecting Rehab Potential  (+) Motivated, enjoys physical activity (-) Multiple comorbidities, hx of smoking, chronic pain    PT Frequency  2x / week    PT Duration  6 weeks    PT Treatment/Interventions  ADLs/Self Care Home Management;Cryotherapy;Electrical Stimulation;Moist Heat;Traction;Gait training;Stair training;Functional mobility training;Therapeutic activities;Therapeutic exercise;Neuromuscular re-education;Balance training;Patient/family education;Manual techniques;Energy conservation;Spinal Manipulations;Joint Manipulations;Taping    PT Next Visit Plan  Reasses HEP, Hip ER strength,     PT Home Exercise Plan  Walk 3/4 mile, side-lying hip abduction    Consulted and Agree with Plan of Care  Patient       Patient will benefit from skilled therapeutic intervention in order to improve the following deficits and impairments:  Improper body mechanics, Pain, Increased muscle spasms, Decreased activity tolerance, Decreased endurance, Decreased strength  Visit Diagnosis: Chronic hip pain, bilateral  Muscle weakness (generalized)     Problem List Patient Active Problem List   Diagnosis Date Noted  . Aortic  atherosclerosis ([place]) 01/19/2017  . Lymphocytic colitis 12/07/2016  . Noninfectious diarrhea   . Diarrhea of presumed infectious origin 06/26/2016  . Coronary artery disease involving native coronary artery of native heart without angina pectoris 07/22/2015  . Personal history of tobacco use, presenting hazards to health 06/26/2015  . Rectal polyp   . History of smoking 30 or more pack years 05/17/2015  . Personal history of nicotine dependence 05/17/2015  . Constipation 02/13/2015  . Cramp in lower leg 02/12/2015  . Difficulty hearing 02/12/2015  . Acid reflux 02/12/2015  . B12 deficiency 02/12/2015  . Abnormal blood sugar 02/12/2015  . Hearing loss 02/12/2015  . Hypothyroidism 09/10/2014  . Insomnia 09/05/2014  . Other specified postprocedural states 04/18/2013  . S/P breast reconstruction, left 04/18/2013  . Personal history of malignant neoplasm of breast 06/20/2012  . History of abnormal Pap smear 06/20/2012  . Benign hypertension 07/01/2009  . Essential (primary) hypertension 07/01/2009  . Genital herpes 03/26/2009  . 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 Hypercholesterolemia without hypertriglyceridemia 02/21/2009  . Allergic rhinitis 02/21/2009    Adriana Hicks, Adriana Hicks 05/12/2018, 10:00 AM  [place] [facility] [address] [place], [address] Phone: [phone]   Fax:  [phone]  Name: Adriana DIGUGLIELMO MRN: [id number] Date of Birth: 07-06-1945

## 2018-05-17 ENCOUNTER — Ambulatory Visit: Payer: Federal, State, Local not specified - PPO

## 2018-05-17 DIAGNOSIS — M25551 Pain in right hip: Secondary | ICD-10-CM | POA: Diagnosis not present

## 2018-05-17 DIAGNOSIS — M25552 Pain in left hip: Principal | ICD-10-CM

## 2018-05-17 DIAGNOSIS — M6281 Muscle weakness (generalized): Secondary | ICD-10-CM

## 2018-05-17 DIAGNOSIS — G8929 Other chronic pain: Secondary | ICD-10-CM

## 2018-05-17 NOTE — Therapy (Signed)
[place] [facility] [address], [place], [address] Phone: [phone]   Fax:  [phone]  Physical Therapy Treatment  Patient Details  Name: Adriana Hicks MRN: [id number] Date of Birth: 1946-01-31 Referring Provider (PT): [name]   Encounter Date: 05/17/2018  PT End of Session - 05/17/18 0926    Visit Number  6    Number of Visits  13    Date for PT Re-Evaluation  06/08/18    PT Start Time  0845    PT Stop Time  0930    PT Time Calculation (min)  45 min    Activity Tolerance  Patient tolerated treatment well    Behavior During Therapy  [facility] for tasks assessed/performed       Past Medical History:  Diagnosis Date  . Abnormal Pap smear of cervix 3/15   ASCUS HPV not detected  . ADD (attention deficit disorder)   . ADD (attention deficit disorder)   . Anemia   . Arthritis    fingers  . Breast cancer ([place])    Left; Age 73  . Colitis   . Genital herpes 03/26/2009  . GERD (gastroesophageal reflux disease)   . History of chest pain    Related to reflux  . HSV-2 (herpes simplex virus 2) infection   . Hyperlipidemia   . Hypertension   . Hypothyroidism   . Personal history of chemotherapy   . Personal history of tobacco use, presenting hazards to health 06/26/2015  . S/P appendectomy 1981  . Seborrheic dermatitis   . Vertigo    x1 - approx 5 yrs ago    Past Surgical History:  Procedure Laterality Date  . APPENDECTOMY    . AUGMENTATION MAMMAPLASTY  1982  . AUGMENTATION MAMMAPLASTY Bilateral 1025   silicone gel  . BREAST IMPLANT EXCHANGE Bilateral 04/21/2016   Procedure: REMOVAL OF BREAST IMPLANTS WITH IMMEDIATE REPLACMENTOF BREAST IMPLANTS;  Surgeon: Youlanda Roys, MD;  Location: [place];  Service: Plastics;  Laterality: Bilateral;  . BREAST SURGERY  2005   Breast Implant replaceddue to leaking  . BREAST SURGERY Left 2006   due to asymmetry  . CARDIAC CATHETERIZATION      2011 at [facility] - reflux related  . CERVICAL BIOPSY  W/ LOOP ELECTRODE EXCISION     unsure pathology   . CESAREAN SECTION  1985  . COLONOSCOPY WITH PROPOFOL N/A 06/17/2015   Procedure: COLONOSCOPY WITH PROPOFOL;  Surgeon: Lucilla Lame, MD;  Location: [place];  Service: Endoscopy;  Laterality: N/A;  PT PREFERS EARLY AM  . COLONOSCOPY WITH PROPOFOL N/A 07/16/2016   Procedure: COLONOSCOPY WITH PROPOFOL;  Surgeon: Lucilla Lame, MD;  Location: [place];  Service: Endoscopy;  Laterality: N/A;  requests early as possible  . COSMETIC SURGERY     eyelid surgery  . DILATION AND CURETTAGE OF UTERUS    . ENDOMETRIAL BIOPSY    . MASTECTOMY Left    new implant on the Right  . MASTOPEXY Right 04/21/2016   Procedure: MASTOPEXY;  Surgeon: Youlanda Roys, MD;  Location: [place];  Service: Plastics;  Laterality: Right;  . POLYPECTOMY  06/17/2015   Procedure: POLYPECTOMY;  Surgeon: Lucilla Lame, MD;  Location: [place];  Service: Endoscopy;;  . TONSILLECTOMY  child  . TUBAL LIGATION      There were no vitals filed for this visit.  Subjective Assessment - 05/17/18 0849    Subjective  Pt reports walking 5 miles on Sunday. Pt states she experienced an increase in hip pain that evening but that it dissipated the following day. Pt states she is walking further than she initially thought she was capable of doing.    Limitations  Walking;Lifting    How long can you sit comfortably?  Unlimited    How long can you stand comfortably?  Unlimited    How long can you walk comfortably?  ~1 mile    Patient Stated Goals  Return to walking, no pain when sleeping    Currently in Pain?  No/denies    Pain Onset  More than a month ago     TREATMENT  THERAPEUTIC EXERCISE  Walking on treadmill: 10 min at 2.5 MPH Single leg on leg press: 2x12   Right: 35 lbs   Left: 25 lbs  Seated TB (Green) hip ER: 2x15  Seated TB (Red) hip IR: 2x15  Side stepping with  TB (Red) around knees: x4 by 20 feet  B 8" step-up with contralateral hip flexion: 2x10  Therapeutic Exercises to improve LE strength with a focus on lateral hip strengthening. Exercises targeted SLS activities to improve glute med strength. Min tactile cueing required with improved carryover after with seated Hip ER and IR exercises. Pt displayed fatigue after session.  PT Education - 05/17/18 0854    Education Details  Form/technique with exercise.    Person(s) Educated  Patient    Methods  Explanation;Demonstration    Comprehension  Verbalized understanding;Returned demonstration;Tactile cues required       PT Short Term Goals - 04/27/18 1507      PT SHORT TERM GOAL #1   Title  Pt will have 100% compliance with HEP to improve hip strength and walking tolerance    Baseline  04/27/2018: Side-lying hip abduction and wlkaing 3/4 mile every other day    Time  2    Period  Weeks    Status  New    Target Date  05/11/18        PT Long Term Goals - 04/27/18 1509      PT LONG TERM GOAL #1   Title  Pt will have a decrease of at least 3 points on NPRS when sleeping to demonstrate improvement in therapy.    Baseline  04/27/2018: 5/10 NPRS    Time  6    Period  Weeks    Status  New    Target Date  06/08/18      PT LONG TERM GOAL #2   Title  Pt will be able to walk 2 miles with no pain to return to recreational activities with friends.    Baseline  04/27/2018: 5/10 NPRS when walking 1 mile    Time  6    Period  Weeks    Status  New    Target Date  06/08/18      PT LONG TERM GOAL #3   Title  Patient will demosntrate a worst pain of 1/10 to indicate significant improvement in pain and improvement overall with hip pain    Baseline  3/10    Time  6    Period  Weeks    Status  New    Target Date  06/08/18            Plan - 05/17/18 6283    Clinical Impression Statement  Pt was able to increase resistance of all hip strengthening exercises without an increase in hip pain. Pt is  able to tolerate an increase in walking at home displaying long term carryover from previous sessions. Pt continues to display LE weakness and tactile cueing for hip exercises. Pt can continue to benefit from skilled treatment to improve these impairments to return to PLOF.    Rehab Potential  Good    Clinical Impairments Affecting Rehab Potential  (+) Motivated, enjoys physical activity (-) Multiple comorbidities, hx of smoking, chronic pain    PT Frequency  2x / week    PT Duration  6 weeks    PT Treatment/Interventions  ADLs/Self Care Home Management;Cryotherapy;Electrical Stimulation;Moist Heat;Traction;Gait training;Stair training;Functional mobility training;Therapeutic activities;Therapeutic exercise;Neuromuscular re-education;Balance training;Patient/family education;Manual techniques;Energy conservation;Spinal Manipulations;Joint Manipulations;Taping    PT Next Visit Plan  Reasses HEP, Hip ER strength,     PT Home Exercise Plan  Walk 3/4 mile, side-lying hip abduction    Consulted and Agree with Plan of Care  Patient       Patient will benefit from skilled therapeutic intervention in order to improve the following deficits and impairments:  Improper body mechanics, Pain, Increased muscle spasms, Decreased activity tolerance, Decreased endurance, Decreased strength  Visit Diagnosis: Chronic hip pain, bilateral  Muscle weakness (generalized)     Problem List Patient Active Problem List   Diagnosis Date Noted  . Aortic atherosclerosis ([place]) 01/19/2017  . Lymphocytic colitis 12/07/2016  . Noninfectious diarrhea   . Diarrhea of presumed infectious origin 06/26/2016  . Coronary artery disease involving native coronary artery of native heart without angina pectoris 07/22/2015  . Personal history of tobacco use, presenting hazards to health 06/26/2015  . Rectal polyp   . History of smoking 30 or more pack years 05/17/2015  . Personal history of nicotine dependence 05/17/2015  .  Constipation 02/13/2015  . Cramp in lower leg 02/12/2015  . Difficulty hearing 02/12/2015  . Acid reflux 02/12/2015  . B12 deficiency 02/12/2015  . Abnormal blood sugar 02/12/2015  . Hearing loss 02/12/2015  . Hypothyroidism 09/10/2014  . Insomnia 09/05/2014  . Other specified postprocedural states 04/18/2013  . S/P breast reconstruction, left 04/18/2013  . Personal history of malignant neoplasm of breast 06/20/2012  . History of abnormal Pap smear 06/20/2012  . Benign hypertension 07/01/2009  . Essential (primary) hypertension 07/01/2009  . Genital herpes 03/26/2009  . 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 Hypercholesterolemia without hypertriglyceridemia 02/21/2009  . Allergic rhinitis 02/21/2009    Rachael Fee, SPT 05/17/2018, 10:11 AM  [place] [address], [place], [address] Phone: [phone]   Fax:  [phone]  Name: KIMALA HORNE MRN: [id number] Date of Birth: September 05, 1945

## 2018-05-19 ENCOUNTER — Ambulatory Visit: Payer: Federal, State, Local not specified - PPO

## 2018-05-19 DIAGNOSIS — M25551 Pain in right hip: Principal | ICD-10-CM

## 2018-05-19 DIAGNOSIS — M6281 Muscle weakness (generalized): Secondary | ICD-10-CM

## 2018-05-19 DIAGNOSIS — M25552 Pain in left hip: Principal | ICD-10-CM

## 2018-05-19 DIAGNOSIS — G8929 Other chronic pain: Secondary | ICD-10-CM

## 2018-05-19 NOTE — Therapy (Signed)
[facility] [address] [place], [address] Phone: [phone]   Fax:  [phone]  Physical Therapy Treatment  Patient Details  Name: Adriana Hicks MRN: [id number] Date of Birth: Jan 30, 1946 Referring Provider (PT): [name]   Encounter Date: 05/19/2018  PT End of Session - 05/19/18 0942    Visit Number  7    Number of Visits  13    Date for PT Re-Evaluation  06/08/18    PT Start Time  0845    PT Stop Time  0930    PT Time Calculation (min)  45 min    Activity Tolerance  Patient tolerated treatment well    Behavior During Therapy  [facility] for tasks assessed/performed       Past Medical History:  Diagnosis Date  . Abnormal Pap smear of cervix 3/15   ASCUS HPV not detected  . ADD (attention deficit disorder)   . ADD (attention deficit disorder)   . Anemia   . Arthritis    fingers  . Breast cancer ([place])    Left; Age 18  . Colitis   . Genital herpes 03/26/2009  . GERD (gastroesophageal reflux disease)   . History of chest pain    Related to reflux  . HSV-2 (herpes simplex virus 2) infection   . Hyperlipidemia   . Hypertension   . Hypothyroidism   . Personal history of chemotherapy   . Personal history of tobacco use, presenting hazards to health 06/26/2015  . S/P appendectomy 1981  . Seborrheic dermatitis   . Vertigo    x1 - approx 5 yrs ago    Past Surgical History:  Procedure Laterality Date  . APPENDECTOMY    . AUGMENTATION MAMMAPLASTY  1982  . AUGMENTATION MAMMAPLASTY Bilateral 2595   silicone gel  . BREAST IMPLANT EXCHANGE Bilateral 04/21/2016   Procedure: REMOVAL OF BREAST IMPLANTS WITH IMMEDIATE REPLACMENTOF BREAST IMPLANTS;  Surgeon: Youlanda Roys, MD;  Location: [place];  Service: Plastics;  Laterality: Bilateral;  . BREAST SURGERY  2005   Breast Implant replaceddue to leaking  . BREAST SURGERY Left 2006   due to asymmetry  . CARDIAC CATHETERIZATION      2011 at [facility] - reflux related  . CERVICAL BIOPSY  W/ LOOP ELECTRODE EXCISION     unsure pathology   . CESAREAN SECTION  1985  . COLONOSCOPY WITH PROPOFOL N/A 06/17/2015   Procedure: COLONOSCOPY WITH PROPOFOL;  Surgeon: Lucilla Lame, MD;  Location: [place];  Service: Endoscopy;  Laterality: N/A;  PT PREFERS EARLY AM  . COLONOSCOPY WITH PROPOFOL N/A 07/16/2016   Procedure: COLONOSCOPY WITH PROPOFOL;  Surgeon: Lucilla Lame, MD;  Location: [place];  Service: Endoscopy;  Laterality: N/A;  requests early as possible  . COSMETIC SURGERY     eyelid surgery  . DILATION AND CURETTAGE OF UTERUS    . ENDOMETRIAL BIOPSY    . MASTECTOMY Left    new implant on the Right  . MASTOPEXY Right 04/21/2016   Procedure: MASTOPEXY;  Surgeon: Youlanda Roys, MD;  Location: [place];  Service: Plastics;  Laterality: Right;  . POLYPECTOMY  06/17/2015   Procedure: POLYPECTOMY;  Surgeon: Lucilla Lame, MD;  Location: [place];  Service: Endoscopy;;  . TONSILLECTOMY  child  . TUBAL LIGATION      There were no vitals filed for this visit.  Subjective Assessment - 05/19/18 0850    Subjective  Pt reports improved sleeping through the night secondary to less hip pain when sleeping on sides. Pt states she is sleeping with pillow between knees and using CBD oil and she believes it is helping with sleep. Pt also states walking dog with no exacerbation of pain. No reports of soreness from previous session.     Limitations  Walking;Lifting    How long can you sit comfortably?  Unlimited    How long can you stand comfortably?  Unlimited    How long can you walk comfortably?  ~1 mile    Patient Stated Goals  Return to walking, no pain when sleeping    Currently in Pain?  No/denies    Pain Onset  More than a month ago     TREATMENT  THERAPEUTIC EXERCISE   8" step-up: 2x10, 2 lbs ankle weights    8" lateral step-up: 2x10, 2 lbs ankle weights  Single leg  glut bridge: 2x10   B Hip hikes on airex pad: 2x15  Side-stepping with TB (Red): x4, 20 feet  Leg press: 1x20, 75 lbs Single leg leg press: 2x12   Right LE: 35 Lbs   Left LE: 25 lbs  Therapeutic exercises to focus on glut med and glut min strength to improve B hip pain. Pt continues to require min tactile cueing for hip hikes. Pt displayed increased fatigue in lateral hips after treatment.  PT Education - 05/19/18 6295    Education Details  Form/technique with exercise.    Person(s) Educated  Patient    Methods  Explanation;Demonstration    Comprehension  Verbalized understanding;Returned demonstration;Tactile cues required       PT Short Term Goals - 04/27/18 1507      PT SHORT TERM GOAL #1   Title  Pt will have 100% compliance with HEP to improve hip strength and walking tolerance    Baseline  04/27/2018: Side-lying hip abduction and wlkaing 3/4 mile every other day    Time  2    Period  Weeks    Status  New    Target Date  05/11/18        PT Long Term Goals - 04/27/18 1509      PT LONG TERM GOAL #1   Title  Pt will have a decrease of at least 3 points on NPRS when sleeping to demonstrate improvement in therapy.    Baseline  04/27/2018: 5/10 NPRS    Time  6    Period  Weeks    Status  New    Target Date  06/08/18      PT LONG TERM GOAL #2   Title  Pt will be able to walk 2 miles with no pain to return to recreational activities with friends.    Baseline  04/27/2018: 5/10 NPRS when walking 1 mile    Time  6    Period  Weeks    Status  New    Target Date  06/08/18      PT LONG TERM GOAL #3   Title  Patient will demosntrate a worst pain of 1/10 to indicate significant improvement in pain and improvement overall with hip pain    Baseline  3/10    Time  6    Period  Weeks    Status  New    Target Date  06/08/18            Plan - 05/19/18 0943    Clinical Impression Statement  Pt continues to tolerate an  increase in repetitions of hip strenghtening exercises  with no increase in hip pain indicating improved hip strength., Pt also displays less soreness from previous sessions which also indicates improved long term carryover in strength from PT treatment. However pt continues to have decreased SLS balance and strength particularly on left LE. Pt also requires min tactile cues for hip hike exercises in order to target glut med and glut min for improved lateral hip strength. Pt can continue to benefit from skilled PT treatment to improve these impairments to return pt to PLOF.    Rehab Potential  Good    Clinical Impairments Affecting Rehab Potential  (+) Motivated, enjoys physical activity (-) Multiple comorbidities, hx of smoking, chronic pain    PT Frequency  2x / week    PT Duration  6 weeks    PT Treatment/Interventions  ADLs/Self Care Home Management;Cryotherapy;Electrical Stimulation;Moist Heat;Traction;Gait training;Stair training;Functional mobility training;Therapeutic activities;Therapeutic exercise;Neuromuscular re-education;Balance training;Patient/family education;Manual techniques;Energy conservation;Spinal Manipulations;Joint Manipulations;Taping    PT Next Visit Plan  Reasses HEP, Hip ER strength,     PT Home Exercise Plan  Walk 3/4 mile, side-lying hip abduction    Consulted and Agree with Plan of Care  Patient       Patient will benefit from skilled therapeutic intervention in order to improve the following deficits and impairments:  Improper body mechanics, Pain, Increased muscle spasms, Decreased activity tolerance, Decreased endurance, Decreased strength  Visit Diagnosis: Chronic hip pain, bilateral  Muscle weakness (generalized)     Problem List Patient Active Problem List   Diagnosis Date Noted  . Aortic atherosclerosis ([place]) 01/19/2017  . Lymphocytic colitis 12/07/2016  . Noninfectious diarrhea   . Diarrhea of presumed infectious origin 06/26/2016  . Coronary artery disease involving native coronary artery of native  heart without angina pectoris 07/22/2015  . Personal history of tobacco use, presenting hazards to health 06/26/2015  . Rectal polyp   . History of smoking 30 or more pack years 05/17/2015  . Personal history of nicotine dependence 05/17/2015  . Constipation 02/13/2015  . Cramp in lower leg 02/12/2015  . Difficulty hearing 02/12/2015  . Acid reflux 02/12/2015  . B12 deficiency 02/12/2015  . Abnormal blood sugar 02/12/2015  . Hearing loss 02/12/2015  . Hypothyroidism 09/10/2014  . Insomnia 09/05/2014  . Other specified postprocedural states 04/18/2013  . S/P breast reconstruction, left 04/18/2013  . Personal history of malignant neoplasm of breast 06/20/2012  . History of abnormal Pap smear 06/20/2012  . Benign hypertension 07/01/2009  . Essential (primary) hypertension 07/01/2009  . Genital herpes 03/26/2009  . 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 Hypercholesterolemia without hypertriglyceridemia 02/21/2009  . Allergic rhinitis 02/21/2009    Rachael Fee, SPT 05/19/2018, 9:46 AM  [facility] [address], [place], [address] Phone: [phone]   Fax:  [phone]  Name: Adriana Hicks MRN: [id number] Date of Birth: [birth year]

## 2018-05-24 ENCOUNTER — Ambulatory Visit: Payer: Federal, State, Local not specified - PPO | Attending: Student

## 2018-05-24 DIAGNOSIS — G8929 Other chronic pain: Secondary | ICD-10-CM | POA: Diagnosis present

## 2018-05-24 DIAGNOSIS — M25551 Pain in right hip: Secondary | ICD-10-CM | POA: Diagnosis present

## 2018-05-24 DIAGNOSIS — M6281 Muscle weakness (generalized): Secondary | ICD-10-CM | POA: Insufficient documentation

## 2018-05-24 DIAGNOSIS — M25552 Pain in left hip: Secondary | ICD-10-CM | POA: Insufficient documentation

## 2018-05-24 NOTE — Therapy (Signed)
[facility] [address] [place], [address] Phone: [phone]   Fax:  [phone]  Physical Therapy Treatment  Patient Details  Name: Adriana Hicks MRN: [id number] Date of Birth: Oct 10, 1945 Referring Provider (PT): [name]   Encounter Date: 05/24/2018  PT End of Session - 05/24/18 1246    Visit Number  8    Number of Visits  13    Date for PT Re-Evaluation  06/08/18    PT Start Time  1115    PT Stop Time  1200    PT Time Calculation (min)  45 min    Activity Tolerance  Patient tolerated treatment well    Behavior During Therapy  [facility] for tasks assessed/performed       Past Medical History:  Diagnosis Date  . Abnormal Pap smear of cervix 3/15   ASCUS HPV not detected  . ADD (attention deficit disorder)   . ADD (attention deficit disorder)   . Anemia   . Arthritis    fingers  . Breast cancer ([place])    Left; Age 73  . Colitis   . Genital herpes 03/26/2009  . GERD (gastroesophageal reflux disease)   . History of chest pain    Related to reflux  . HSV-2 (herpes simplex virus 2) infection   . Hyperlipidemia   . Hypertension   . Hypothyroidism   . Personal history of chemotherapy   . Personal history of tobacco use, presenting hazards to health 06/26/2015  . S/P appendectomy 1981  . Seborrheic dermatitis   . Vertigo    x1 - approx 5 yrs ago    Past Surgical History:  Procedure Laterality Date  . APPENDECTOMY    . AUGMENTATION MAMMAPLASTY  1982  . AUGMENTATION MAMMAPLASTY Bilateral 9417   silicone gel  . BREAST IMPLANT EXCHANGE Bilateral 04/21/2016   Procedure: REMOVAL OF BREAST IMPLANTS WITH IMMEDIATE REPLACMENTOF BREAST IMPLANTS;  Surgeon: Youlanda Roys, MD;  Location: [place];  Service: Plastics;  Laterality: Bilateral;  . BREAST SURGERY  2005   Breast Implant replaceddue to leaking  . BREAST SURGERY Left 2006   due to asymmetry  . CARDIAC CATHETERIZATION     2011 at [facility] - reflux related  . CERVICAL BIOPSY  W/ LOOP ELECTRODE EXCISION     unsure pathology   . CESAREAN SECTION  1985  . COLONOSCOPY WITH PROPOFOL N/A 06/17/2015   Procedure: COLONOSCOPY WITH PROPOFOL;  Surgeon: Lucilla Lame, MD;  Location: [place];  Service: Endoscopy;  Laterality: N/A;  PT PREFERS EARLY AM  . COLONOSCOPY WITH PROPOFOL N/A 07/16/2016   Procedure: COLONOSCOPY WITH PROPOFOL;  Surgeon: Lucilla Lame, MD;  Location: [place];  Service: Endoscopy;  Laterality: N/A;  requests early as possible  . COSMETIC SURGERY     eyelid surgery  . DILATION AND CURETTAGE OF UTERUS    . ENDOMETRIAL BIOPSY    . MASTECTOMY Left    new implant on the Right  . MASTOPEXY Right 04/21/2016   Procedure: MASTOPEXY;  Surgeon: Youlanda Roys, MD;  Location: [place];  Service: Plastics;  Laterality: Right;  . POLYPECTOMY  06/17/2015   Procedure: POLYPECTOMY;  Surgeon: Lucilla Lame, MD;  Location: [place];  Service: Endoscopy;;  . TONSILLECTOMY  child  . TUBAL LIGATION      There were no vitals filed for this visit.  Subjective Assessment - 05/24/18 1134    Subjective  Patient  demonstrates improvement overall with pain at the end of the day and when preparing to fall asleep at night.     Limitations  Walking;Lifting    How long can you sit comfortably?  Unlimited    How long can you stand comfortably?  Unlimited    How long can you walk comfortably?  ~1 mile    Patient Stated Goals  Return to walking, no pain when sleeping    Currently in Pain?  No/denies    Pain Onset  More than a month ago       TREATMENT  THERAPEUTIC EXERCISE    Sit to stands with foot forward on a step - x20  Leg press: 1x20, 75 lbs Single leg leg press: 2x12                         Right LE: 35 Lbs                         Left LE: 25 lbs  Hip hikes off of airex pad - x 20  Lunges in static standing - x 20   Side-stepping with TB (Green): x4, 20  feet            14" step-up - x12  B  Heel taps off of 6" step for LE knee control - x 12 B                Therapeutic exercises to focus on glut med and glut min strength to improve B hip pain. Pt continues to require min tactile cueing for hip hikes. Pt displayed increased fatigue in lateral hips after treatment   PT Education - 05/24/18 1245    Education Details  form/technique with exercise    Person(s) Educated  Patient    Methods  Explanation;Demonstration    Comprehension  Verbalized understanding;Returned demonstration;Verbal cues required;Tactile cues required       PT Short Term Goals - 04/27/18 1507      PT SHORT TERM GOAL #1   Title  Pt will have 100% compliance with HEP to improve hip strength and walking tolerance    Baseline  04/27/2018: Side-lying hip abduction and wlkaing 3/4 mile every other day    Time  2    Period  Weeks    Status  New    Target Date  05/11/18        PT Long Term Goals - 04/27/18 1509      PT LONG TERM GOAL #1   Title  Pt will have a decrease of at least 3 points on NPRS when sleeping to demonstrate improvement in therapy.    Baseline  04/27/2018: 5/10 NPRS    Time  6    Period  Weeks    Status  New    Target Date  06/08/18      PT LONG TERM GOAL #2   Title  Pt will be able to walk 2 miles with no pain to return to recreational activities with friends.    Baseline  04/27/2018: 5/10 NPRS when walking 1 mile    Time  6    Period  Weeks    Status  New    Target Date  06/08/18      PT LONG TERM GOAL #3   Title  Patient will demosntrate a worst pain of 1/10 to indicate significant improvement in pain and improvement overall with hip pain  Baseline  3/10    Time  6    Period  Weeks    Status  New    Target Date  06/08/18            Plan - 05/24/18 1246    Clinical Impression Statement  Patient demosntrates improvement with overall functioning an decrease in pain. Although patient is improving, she conitnues to have difficutly  with standing and bending in unilateral stance. She also demosntrates increased pain with performing hip abduction/ER in CKC positioning. Patient will benefit from further skilled therapy to return to prior level of function.     Rehab Potential  Good    Clinical Impairments Affecting Rehab Potential  (+) Motivated, enjoys physical activity (-) Multiple comorbidities, hx of smoking, chronic pain    PT Frequency  2x / week    PT Duration  6 weeks    PT Treatment/Interventions  ADLs/Self Care Home Management;Cryotherapy;Electrical Stimulation;Moist Heat;Traction;Gait training;Stair training;Functional mobility training;Therapeutic activities;Therapeutic exercise;Neuromuscular re-education;Balance training;Patient/family education;Manual techniques;Energy conservation;Spinal Manipulations;Joint Manipulations;Taping    PT Next Visit Plan  Reasses HEP, Hip ER strength,     PT Home Exercise Plan  Walk 3/4 mile, side-lying hip abduction    Consulted and Agree with Plan of Care  Patient       Patient will benefit from skilled therapeutic intervention in order to improve the following deficits and impairments:  Improper body mechanics, Pain, Increased muscle spasms, Decreased activity tolerance, Decreased endurance, Decreased strength  Visit Diagnosis: Muscle weakness (generalized)  Chronic hip pain, bilateral     Problem List Patient Active Problem List   Diagnosis Date Noted  . Aortic atherosclerosis ([place]) 01/19/2017  . Lymphocytic colitis 12/07/2016  . Noninfectious diarrhea   . Diarrhea of presumed infectious origin 06/26/2016  . Coronary artery disease involving native coronary artery of native heart without angina pectoris 07/22/2015  . Personal history of tobacco use, presenting hazards to health 06/26/2015  . Rectal polyp   . History of smoking 30 or more pack years 05/17/2015  . Personal history of nicotine dependence 05/17/2015  . Constipation 02/13/2015  . Cramp in lower leg  02/12/2015  . Difficulty hearing 02/12/2015  . Acid reflux 02/12/2015  . B12 deficiency 02/12/2015  . Abnormal blood sugar 02/12/2015  . Hearing loss 02/12/2015  . Hypothyroidism 09/10/2014  . Insomnia 09/05/2014  . Other specified postprocedural states 04/18/2013  . S/P breast reconstruction, left 04/18/2013  . Personal history of malignant neoplasm of breast 06/20/2012  . History of abnormal Pap smear 06/20/2012  . Benign hypertension 07/01/2009  . Essential (primary) hypertension 07/01/2009  . Genital herpes 03/26/2009  . 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 Hypercholesterolemia without hypertriglyceridemia 02/21/2009  . Allergic rhinitis 02/21/2009    Blythe Stanford, PT DPT 05/24/2018, 12:49 PM  [facility] [address], [place], [address] Phone: [phone]   Fax:  [phone]  Name: TORRYN FISKE MRN: [id number] Date of Birth: 1945/11/19

## 2018-05-27 DIAGNOSIS — IMO0002 Reserved for concepts with insufficient information to code with codable children: Secondary | ICD-10-CM | POA: Insufficient documentation

## 2018-05-31 ENCOUNTER — Ambulatory Visit: Payer: Federal, State, Local not specified - PPO

## 2018-05-31 DIAGNOSIS — M25552 Pain in left hip: Secondary | ICD-10-CM

## 2018-05-31 DIAGNOSIS — G8929 Other chronic pain: Secondary | ICD-10-CM

## 2018-05-31 DIAGNOSIS — M25551 Pain in right hip: Secondary | ICD-10-CM

## 2018-05-31 DIAGNOSIS — M6281 Muscle weakness (generalized): Secondary | ICD-10-CM

## 2018-05-31 NOTE — Therapy (Signed)
[facility] [address] [place], [address] Phone: [phone]   Fax:  [phone]  Physical Therapy Treatment  Patient Details  Name: Adriana Hicks MRN: [id number] Date of Birth: 04/19/1945 Referring Provider (PT): [name]   Encounter Date: 05/31/2018  PT End of Session - 05/31/18 1147    Visit Number  9    Number of Visits  13    Date for PT Re-Evaluation  06/08/18    PT Start Time  1115    PT Stop Time  1200    PT Time Calculation (min)  45 min    Activity Tolerance  Patient tolerated treatment well    Behavior During Therapy  [facility] for tasks assessed/performed       Past Medical History:  Diagnosis Date  . Abnormal Pap smear of cervix 3/15   ASCUS HPV not detected  . ADD (attention deficit disorder)   . ADD (attention deficit disorder)   . Anemia   . Arthritis    fingers  . Breast cancer ([place])    Left; Age 45  . Colitis   . Genital herpes 03/26/2009  . GERD (gastroesophageal reflux disease)   . History of chest pain    Related to reflux  . HSV-2 (herpes simplex virus 2) infection   . Hyperlipidemia   . Hypertension   . Hypothyroidism   . Personal history of chemotherapy   . Personal history of tobacco use, presenting hazards to health 06/26/2015  . S/P appendectomy 1981  . Seborrheic dermatitis   . Vertigo    x1 - approx 5 yrs ago    Past Surgical History:  Procedure Laterality Date  . APPENDECTOMY    . AUGMENTATION MAMMAPLASTY  1982  . AUGMENTATION MAMMAPLASTY Bilateral 0263   silicone gel  . BREAST IMPLANT EXCHANGE Bilateral 04/21/2016   Procedure: REMOVAL OF BREAST IMPLANTS WITH IMMEDIATE REPLACMENTOF BREAST IMPLANTS;  Surgeon: Youlanda Roys, MD;  Location: [place];  Service: Plastics;  Laterality: Bilateral;  . BREAST SURGERY  2005   Breast Implant replaceddue to leaking  . BREAST SURGERY Left 2006   due to asymmetry  . CARDIAC CATHETERIZATION      2011 at [facility] - reflux related  . CERVICAL BIOPSY  W/ LOOP ELECTRODE EXCISION     unsure pathology   . CESAREAN SECTION  1985  . COLONOSCOPY WITH PROPOFOL N/A 06/17/2015   Procedure: COLONOSCOPY WITH PROPOFOL;  Surgeon: Lucilla Lame, MD;  Location: [place];  Service: Endoscopy;  Laterality: N/A;  PT PREFERS EARLY AM  . COLONOSCOPY WITH PROPOFOL N/A 07/16/2016   Procedure: COLONOSCOPY WITH PROPOFOL;  Surgeon: Lucilla Lame, MD;  Location: [place];  Service: Endoscopy;  Laterality: N/A;  requests early as possible  . COSMETIC SURGERY     eyelid surgery  . DILATION AND CURETTAGE OF UTERUS    . ENDOMETRIAL BIOPSY    . MASTECTOMY Left    new implant on the Right  . MASTOPEXY Right 04/21/2016   Procedure: MASTOPEXY;  Surgeon: Youlanda Roys, MD;  Location: [place];  Service: Plastics;  Laterality: Right;  . POLYPECTOMY  06/17/2015   Procedure: POLYPECTOMY;  Surgeon: Lucilla Lame, MD;  Location: [place];  Service: Endoscopy;;  . TONSILLECTOMY  child  . TUBAL LIGATION      There were no vitals filed for this visit.  Subjective Assessment - 05/31/18 1145    Subjective  Patient reports improvement in sleep and states her hip has not been hurting as much at night. Patient states improvement overall.     Limitations  Walking;Lifting    How long can you sit comfortably?  Unlimited    How long can you stand comfortably?  Unlimited    How long can you walk comfortably?  ~1 mile    Patient Stated Goals  Return to walking, no pain when sleeping    Currently in Pain?  No/denies    Pain Onset  More than a month ago       TREATMENT  THERAPEUTIC EXERCISE                Leg press: 1x20, 75 lbs; SL 35# x 10 on the L; 45# x 10 on the R Knee extension in standing at [place] - x 20 25# B Side-stepping with TB (Green): x4, 20 ft Semi-circles in standing with furniture slider - x 14 Small circles into hip abduction/extension - x 75sec   SLS on airex pad -  2 x 30sec Squat in standing with UE support - x 20  Running man off of airex pad - x 20 B            14" step-up - x15 B   Therapeutic exercises to focus on glut med and glut min strength to improve B hip pain. Pt continues to require min tactile cueing for hip hikes. Pt displayed increased fatigue in lateral hips after treatment   PT Education - 05/31/18 1147    Education Details  form/technique with exercise    Person(s) Educated  Patient    Methods  Explanation;Demonstration    Comprehension  Verbalized understanding;Returned demonstration       PT Short Term Goals - 04/27/18 1507      PT SHORT TERM GOAL #1   Title  Pt will have 100% compliance with HEP to improve hip strength and walking tolerance    Baseline  04/27/2018: Side-lying hip abduction and wlkaing 3/4 mile every other day    Time  2    Period  Weeks    Status  New    Target Date  05/11/18        PT Long Term Goals - 04/27/18 1509      PT LONG TERM GOAL #1   Title  Pt will have a decrease of at least 3 points on NPRS when sleeping to demonstrate improvement in therapy.    Baseline  04/27/2018: 5/10 NPRS    Time  6    Period  Weeks    Status  New    Target Date  06/08/18      PT LONG TERM GOAL #2   Title  Pt will be able to walk 2 miles with no pain to return to recreational activities with friends.    Baseline  04/27/2018: 5/10 NPRS when walking 1 mile    Time  6    Period  Weeks    Status  New    Target Date  06/08/18      PT LONG TERM GOAL #3   Title  Patient will demosntrate a worst pain of 1/10 to indicate significant improvement in pain and improvement overall with hip pain    Baseline  3/10    Time  6    Period  Weeks    Status  New    Target Date  06/08/18  Plan - 05/31/18 1148    Clinical Impression Statement  Patient demonstrates improvement with ability to perform greater amount of exercises of higher intensity compared to previous sessions indicating  functional carryover between sessions. Although patient is improving, she continues to have increased pain at night limiting sleep quality. Patient will benefit from further skilled therapy to return to prior level of function.     Rehab Potential  Good    Clinical Impairments Affecting Rehab Potential  (+) Motivated, enjoys physical activity (-) Multiple comorbidities, hx of smoking, chronic pain    PT Frequency  2x / week    PT Duration  6 weeks    PT Treatment/Interventions  ADLs/Self Care Home Management;Cryotherapy;Electrical Stimulation;Moist Heat;Traction;Gait training;Stair training;Functional mobility training;Therapeutic activities;Therapeutic exercise;Neuromuscular re-education;Balance training;Patient/family education;Manual techniques;Energy conservation;Spinal Manipulations;Joint Manipulations;Taping    PT Next Visit Plan  Reasses HEP, Hip ER strength,     PT Home Exercise Plan  Walk 3/4 mile, side-lying hip abduction    Consulted and Agree with Plan of Care  Patient       Patient will benefit from skilled therapeutic intervention in order to improve the following deficits and impairments:  Improper body mechanics, Pain, Increased muscle spasms, Decreased activity tolerance, Decreased endurance, Decreased strength  Visit Diagnosis: Muscle weakness (generalized)  Chronic hip pain, bilateral     Problem List Patient Active Problem List   Diagnosis Date Noted  . Aortic atherosclerosis ([place]) 01/19/2017  . Lymphocytic colitis 12/07/2016  . Noninfectious diarrhea   . Diarrhea of presumed infectious origin 06/26/2016  . Coronary artery disease involving native coronary artery of native heart without angina pectoris 07/22/2015  . Personal history of tobacco use, presenting hazards to health 06/26/2015  . Rectal polyp   . History of smoking 30 or more pack years 05/17/2015  . Personal history of nicotine dependence 05/17/2015  . Constipation 02/13/2015  . Cramp in lower leg  02/12/2015  . Difficulty hearing 02/12/2015  . Acid reflux 02/12/2015  . B12 deficiency 02/12/2015  . Abnormal blood sugar 02/12/2015  . Hearing loss 02/12/2015  . Hypothyroidism 09/10/2014  . Insomnia 09/05/2014  . Other specified postprocedural states 04/18/2013  . S/P breast reconstruction, left 04/18/2013  . Personal history of malignant neoplasm of breast 06/20/2012  . History of abnormal Pap smear 06/20/2012  . Benign hypertension 07/01/2009  . Essential (primary) hypertension 07/01/2009  . Genital herpes 03/26/2009  . 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 Hypercholesterolemia without hypertriglyceridemia 02/21/2009  . Allergic rhinitis 02/21/2009    Blythe Stanford, PT DPT 05/31/2018, 11:52 AM  [facility] [address] [place], [address] Phone: [phone]   Fax:  [phone]  Name: Adriana Hicks MRN: [id number] Date of Birth: 1945-11-09

## 2018-06-02 ENCOUNTER — Other Ambulatory Visit: Payer: Self-pay

## 2018-06-02 ENCOUNTER — Ambulatory Visit: Payer: Federal, State, Local not specified - PPO

## 2018-06-02 DIAGNOSIS — G8929 Other chronic pain: Secondary | ICD-10-CM

## 2018-06-02 DIAGNOSIS — M6281 Muscle weakness (generalized): Secondary | ICD-10-CM | POA: Diagnosis not present

## 2018-06-02 DIAGNOSIS — M25552 Pain in left hip: Secondary | ICD-10-CM

## 2018-06-02 DIAGNOSIS — M25551 Pain in right hip: Secondary | ICD-10-CM

## 2018-06-02 NOTE — Therapy (Addendum)
[facility] [address], [place], [address] Phone: [phone]   Fax:  [phone]  Physical Therapy Treatment/Progress note  Patient Details  Name: Adriana Hicks MRN: [id number] Date of Birth: 1945-08-19 Referring Provider (PT): [name]   Encounter Date: 06/02/2018  PT End of Session - 06/02/18 1152    Visit Number  10    Number of Visits  13    Date for PT Re-Evaluation  06/08/18    PT Start Time  1118    PT Stop Time  1200    PT Time Calculation (min)  42 min    Activity Tolerance  Patient tolerated treatment well    Behavior During Therapy  [facility] for tasks assessed/performed       Past Medical History:  Diagnosis Date  . Abnormal Pap smear of cervix 3/15   ASCUS HPV not detected  . ADD (attention deficit disorder)   . ADD (attention deficit disorder)   . Anemia   . Arthritis    fingers  . Breast cancer ([place])    Left; Age 29  . Colitis   . Genital herpes 03/26/2009  . GERD (gastroesophageal reflux disease)   . History of chest pain    Related to reflux  . HSV-2 (herpes simplex virus 2) infection   . Hyperlipidemia   . Hypertension   . Hypothyroidism   . Personal history of chemotherapy   . Personal history of tobacco use, presenting hazards to health 06/26/2015  . S/P appendectomy 1981  . Seborrheic dermatitis   . Vertigo    x1 - approx 5 yrs ago    Past Surgical History:  Procedure Laterality Date  . APPENDECTOMY    . AUGMENTATION MAMMAPLASTY  1982  . AUGMENTATION MAMMAPLASTY Bilateral 6195   silicone gel  . BREAST IMPLANT EXCHANGE Bilateral 04/21/2016   Procedure: REMOVAL OF BREAST IMPLANTS WITH IMMEDIATE REPLACMENTOF BREAST IMPLANTS;  Surgeon: Youlanda Roys, MD;  Location: [place];  Service: Plastics;  Laterality: Bilateral;  . BREAST SURGERY  2005   Breast Implant replaceddue to leaking  . BREAST SURGERY Left 2006   due to asymmetry  . CARDIAC  CATHETERIZATION     2011 at [facility] - reflux related  . CERVICAL BIOPSY  W/ LOOP ELECTRODE EXCISION     unsure pathology   . CESAREAN SECTION  1985  . COLONOSCOPY WITH PROPOFOL N/A 06/17/2015   Procedure: COLONOSCOPY WITH PROPOFOL;  Surgeon: Lucilla Lame, MD;  Location: [place];  Service: Endoscopy;  Laterality: N/A;  PT PREFERS EARLY AM  . COLONOSCOPY WITH PROPOFOL N/A 07/16/2016   Procedure: COLONOSCOPY WITH PROPOFOL;  Surgeon: Lucilla Lame, MD;  Location: [place];  Service: Endoscopy;  Laterality: N/A;  requests early as possible  . COSMETIC SURGERY     eyelid surgery  . DILATION AND CURETTAGE OF UTERUS    . ENDOMETRIAL BIOPSY    . MASTECTOMY Left    new implant on the Right  . MASTOPEXY Right 04/21/2016   Procedure: MASTOPEXY;  Surgeon: Youlanda Roys, MD;  Location: [place];  Service: Plastics;  Laterality: Right;  . POLYPECTOMY  06/17/2015   Procedure: POLYPECTOMY;  Surgeon: Lucilla Lame, MD;  Location: [place];  Service: Endoscopy;;  . TONSILLECTOMY  child  . TUBAL LIGATION      There were no vitals filed for this visit.  Subjective Assessment - 06/02/18 1131    Subjective  Patient states she has not been taking her celebrex secondary to having PRP injections she has experienced increased pain.     Limitations  Walking;Lifting    How long can you sit comfortably?  Unlimited    How long can you stand comfortably?  Unlimited    How long can you walk comfortably?  ~1 mile    Patient Stated Goals  Return to walking, no pain when sleeping    Currently in Pain?  Yes    Pain Score  2     Pain Location  Hip    Pain Orientation  Right;Left    Pain Descriptors / Indicators  Aching    Pain Type  Chronic pain    Pain Onset  More than a month ago    Pain Frequency  Intermittent       TREATMENT Therapeutic Exercise: Hip extension off of airex pad - x 20 Hip abduction off of airex pad - x 20 Squats in standing - x  20  Single leg stance - 30sec x 3  single leg rotations on airex pad - -x 20 Side stepping across balance stones - x 20  Forward step ups on Bosu ball - x 20 B Side stepping up and over bosu ball - x 20 B Performed greater amount of exercises in standing as patient demonstrates increased fatigue and greater feeling of uncomfortableness today. Focused on hip muscular endurance exercise as to not increase pain   PT Education - 06/02/18 1145    Education Details  form/technique with exercise    Person(s) Educated  Patient    Methods  Explanation;Demonstration    Comprehension  Verbalized understanding;Returned demonstration       PT Short Term Goals - 04/27/18 1507      PT SHORT TERM GOAL #1   Title  Pt will have 100% compliance with HEP to improve hip strength and walking tolerance    Baseline  04/27/2018: Side-lying hip abduction and wlkaing 3/4 mile every other day    Time  2    Period  Weeks    Status  New    Target Date  05/11/18        PT Long Term Goals - 06/02/18 1213      PT LONG TERM GOAL #1   Title  Pt will have a decrease of at least 3 points on NPRS when sleeping to demonstrate improvement in therapy.    Baseline  04/27/2018: 5/10 NPRS; 06/02/2018: 3/10    Time  6    Period  Weeks    Status  On-going      PT LONG TERM GOAL #2   Title  Pt will be able to walk 2 miles with no pain to return to recreational activities with friends.    Baseline  04/27/2018: 5/10 NPRS when walking 1 mile; 2/10 with walking 1 mile    Time  6    Period  Weeks    Status  On-going      PT LONG TERM GOAL #3   Title  Patient will demosntrate a worst pain of 1/10 to indicate significant improvement in pain and improvement overall with hip pain    Baseline  3/10; 06/02/2018: 3/10    Time  6    Period  Weeks    Status  On-going            Plan - 06/02/18 1153    Clinical Impression Statement  Patient is making progress towards long term  goals with improvement overall in pain at night  and pain with walking for prolonged periods of time. Although patient is improving, she continues to have increased pain and inability to walk for prologned periods of time without steady increase in pain. Paitent will benefit from further skilled therapy to return to prior level of function.     Personal Factors and Comorbidities  Age    Rehab Potential  Good    Clinical Impairments Affecting Rehab Potential  (+) Motivated, enjoys physical activity (-) Multiple comorbidities, hx of smoking, chronic pain    PT Frequency  2x / week    PT Duration  6 weeks    PT Treatment/Interventions  ADLs/Self Care Home Management;Cryotherapy;Electrical Stimulation;Moist Heat;Traction;Gait training;Stair training;Functional mobility training;Therapeutic activities;Therapeutic exercise;Neuromuscular re-education;Balance training;Patient/family education;Manual techniques;Energy conservation;Spinal Manipulations;Joint Manipulations;Taping    PT Next Visit Plan  Reasses HEP, Hip ER strength,     PT Home Exercise Plan  Walk 3/4 mile, side-lying hip abduction    Consulted and Agree with Plan of Care  Patient       Patient will benefit from skilled therapeutic intervention in order to improve the following deficits and impairments:  Improper body mechanics, Pain, Increased muscle spasms, Decreased activity tolerance, Decreased endurance, Decreased strength  Visit Diagnosis: Muscle weakness (generalized)  Chronic hip pain, bilateral     Problem List Patient Active Problem List   Diagnosis Date Noted  . Aortic atherosclerosis ([place]) 01/19/2017  . Lymphocytic colitis 12/07/2016  . Noninfectious diarrhea   . Diarrhea of presumed infectious origin 06/26/2016  . Coronary artery disease involving native coronary artery of native heart without angina pectoris 07/22/2015  . Personal history of tobacco use, presenting hazards to health 06/26/2015  . Rectal polyp   . History of smoking 30 or more pack years  05/17/2015  . Personal history of nicotine dependence 05/17/2015  . Constipation 02/13/2015  . Cramp in lower leg 02/12/2015  . Difficulty hearing 02/12/2015  . Acid reflux 02/12/2015  . B12 deficiency 02/12/2015  . Abnormal blood sugar 02/12/2015  . Hearing loss 02/12/2015  . Hypothyroidism 09/10/2014  . Insomnia 09/05/2014  . Other specified postprocedural states 04/18/2013  . S/P breast reconstruction, left 04/18/2013  . Personal history of malignant neoplasm of breast 06/20/2012  . History of abnormal Pap smear 06/20/2012  . Benign hypertension 07/01/2009  . Essential (primary) hypertension 07/01/2009  . Genital herpes 03/26/2009  . 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 Hypercholesterolemia without hypertriglyceridemia 02/21/2009  . Allergic rhinitis 02/21/2009    Blythe Stanford, PT DPT 06/02/2018, 12:14 PM  [facility] [address] [place], [address] Phone: [phone]   Fax:  [phone]  Name: Adriana Hicks MRN: [id number] Date of Birth: [birth year]

## 2018-06-07 ENCOUNTER — Ambulatory Visit: Payer: Federal, State, Local not specified - PPO

## 2018-06-09 ENCOUNTER — Ambulatory Visit: Payer: Federal, State, Local not specified - PPO

## 2018-06-14 ENCOUNTER — Ambulatory Visit: Payer: Federal, State, Local not specified - PPO

## 2018-06-16 ENCOUNTER — Ambulatory Visit: Payer: Federal, State, Local not specified - PPO

## 2018-06-23 ENCOUNTER — Ambulatory Visit: Payer: Federal, State, Local not specified - PPO

## 2018-07-04 NOTE — Therapy (Signed)
[place] [facility]IN [facility] SERVICES [address] [place], [place], [address] Phone: [phone]   Fax:  [phone]  Patient Details  Name: Adriana Hicks MRN: [id number] Date of Birth: 1946/03/03 Referring Provider:  No ref. provider found  Encounter Date: 07/04/2018  The patient has been contacted today in regards to telehealth services. The patient expressed an interest in participating in telehealth visits. Patient has been informed that an [facility] support representative will be reaching out to them to verify their insurance benefits and for scheduling       Blythe Stanford, PT DPT 07/04/2018, 3:38 PM  [place], [place], [address] Phone: [phone]   Fax:  [phone]

## 2018-08-17 ENCOUNTER — Telehealth: Payer: Self-pay | Admitting: Certified Nurse Midwife

## 2018-08-17 ENCOUNTER — Other Ambulatory Visit: Payer: Self-pay

## 2018-08-17 ENCOUNTER — Ambulatory Visit: Payer: Federal, State, Local not specified - PPO | Attending: Student

## 2018-08-17 DIAGNOSIS — G8929 Other chronic pain: Secondary | ICD-10-CM | POA: Insufficient documentation

## 2018-08-17 DIAGNOSIS — M25552 Pain in left hip: Secondary | ICD-10-CM | POA: Diagnosis present

## 2018-08-17 DIAGNOSIS — M25551 Pain in right hip: Secondary | ICD-10-CM | POA: Diagnosis present

## 2018-08-17 DIAGNOSIS — M6281 Muscle weakness (generalized): Secondary | ICD-10-CM | POA: Diagnosis present

## 2018-08-17 NOTE — Therapy (Signed)
[facility] [address], [place], [address] Phone: [phone]   Fax:  [phone]  Physical Therapy Treatment  Patient Details  Name: Adriana Hicks MRN: [id number] Date of Birth: 1945-07-09 Referring Provider (PT): [facility]  The patient has been informed of current processes in place at Outpatient Rehab to protect patients from Covid-19 exposure including social distancing, schedule modifications, and new cleaning procedures. After discussing their particular risk with a therapist based on the patient's personal risk factors, the patient has decided to proceed with in-person therapy.   Encounter Date: 08/17/2018  PT End of Session - 08/17/18 1355    Visit Number  11    Number of Visits  21    Date for PT Re-Evaluation  09/14/18    PT Start Time  1300    PT Stop Time  1345    PT Time Calculation (min)  45 min    Activity Tolerance  Patient tolerated treatment well    Behavior During Therapy  [facility] for tasks assessed/performed       Past Medical History:  Diagnosis Date  . Abnormal Pap smear of cervix 3/15   ASCUS HPV not detected  . ADD (attention deficit disorder)   . ADD (attention deficit disorder)   . Anemia   . Arthritis    fingers  . Breast cancer ([place])    Left; Age 18  . Colitis   . Genital herpes 03/26/2009  . GERD (gastroesophageal reflux disease)   . History of chest pain    Related to reflux  . HSV-2 (herpes simplex virus 2) infection   . Hyperlipidemia   . Hypertension   . Hypothyroidism   . Personal history of chemotherapy   . Personal history of tobacco use, presenting hazards to health 06/26/2015  . S/P appendectomy 1981  . Seborrheic dermatitis   . Vertigo    x1 - approx 5 yrs ago    Past Surgical History:  Procedure Laterality Date  . APPENDECTOMY    . AUGMENTATION MAMMAPLASTY  1982  . AUGMENTATION MAMMAPLASTY Bilateral 6767   silicone gel  . BREAST IMPLANT EXCHANGE  Bilateral 04/21/2016   Procedure: REMOVAL OF BREAST IMPLANTS WITH IMMEDIATE REPLACMENTOF BREAST IMPLANTS;  Surgeon: Youlanda Roys, MD;  Location: [place];  Service: Plastics;  Laterality: Bilateral;  . BREAST SURGERY  2005   Breast Implant replaceddue to leaking  . BREAST SURGERY Left 2006   due to asymmetry  . CARDIAC CATHETERIZATION     2011 at [facility] - reflux related  . CERVICAL BIOPSY  W/ LOOP ELECTRODE EXCISION     unsure pathology   . CESAREAN SECTION  1985  . COLONOSCOPY WITH PROPOFOL N/A 06/17/2015   Procedure: COLONOSCOPY WITH PROPOFOL;  Surgeon: Lucilla Lame, MD;  Location: [place];  Service: Endoscopy;  Laterality: N/A;  PT PREFERS EARLY AM  . COLONOSCOPY WITH PROPOFOL N/A 07/16/2016   Procedure: COLONOSCOPY WITH PROPOFOL;  Surgeon: Lucilla Lame, MD;  Location: [place];  Service: Endoscopy;  Laterality: N/A;  requests early as possible  . COSMETIC SURGERY     eyelid surgery  . DILATION AND CURETTAGE OF UTERUS    . ENDOMETRIAL BIOPSY    . MASTECTOMY Left    new implant on the Right  . MASTOPEXY Right 04/21/2016   Procedure: MASTOPEXY;  Surgeon: Youlanda Roys, MD;  Location: [place];  Service: Plastics;  Laterality: Right;  .  POLYPECTOMY  06/17/2015   Procedure: POLYPECTOMY;  Surgeon: Lucilla Lame, MD;  Location: [place];  Service: Endoscopy;;  . TONSILLECTOMY  child  . TUBAL LIGATION      There were no vitals filed for this visit.  Subjective Assessment - 08/17/18 1326    Subjective  Patient states increased pain with sleeping and prolonged walking. Patient reports her PRP injections did not do a great amount of help.     Limitations  Walking;Lifting    How long can you sit comfortably?  Unlimited    How long can you stand comfortably?  Unlimited    How long can you walk comfortably?  ~1 mile    Patient Stated Goals  Return to walking, no pain when sleeping    Currently in Pain?   Yes    Pain Score  2     Pain Location  Hip    Pain Orientation  Right;Left    Pain Descriptors / Indicators  Aching    Pain Type  Chronic pain    Pain Onset  More than a month ago    Pain Frequency  Intermittent       TREATMENT Therapeutic Exercise: Hip abduction with RTB - x 20 B Side lying abduction - x 20 with UE support  Hip circles with RTB around knees  - x 30sec cw/ccw B Running man in standing - x 20  Lateral lunge - x10 B  Warrior 3 hip extention with two finger stabilization for balance - x 15 Single leg bend forward with hip strengthening - x 10  Single leg calf raises in standing - x 20   Patient demonstrates increased fatigue and greater feeling of uncomfortableness today. Focused on hip performing exercises focused on improving hip abductors.    PT Education - 08/17/18 1354    Education Details  form/technique with exercise: Added slider, hip abduction s/l and standing, warrior III, single leg calf raise, running man    Person(s) Educated  Patient    Methods  Explanation;Demonstration    Comprehension  Verbalized understanding;Returned demonstration       PT Short Term Goals - 04/27/18 1507      PT SHORT TERM GOAL #1   Title  Pt will have 100% compliance with HEP to improve hip strength and walking tolerance    Baseline  04/27/2018: Side-lying hip abduction and wlkaing 3/4 mile every other day    Time  2    Period  Weeks    Status  New    Target Date  05/11/18        PT Long Term Goals - 08/17/18 1307      PT LONG TERM GOAL #1   Title  Pt will have a decrease of at least 3 points on NPRS when sleeping to demonstrate improvement in therapy.    Baseline  04/27/2018: 5/10 NPRS; 06/02/2018: 3/10; 08/17/2018: 8/10    Time  6    Period  Weeks    Status  On-going      PT LONG TERM GOAL #2   Title  Pt will be able to walk 2 miles with no pain to return to recreational activities with friends.    Baseline  04/27/2018: 5/10 NPRS when walking 1 mile; 2/10 with  walking 1 mile; 08/17/2018: Able to ambulate 5 miles with 5/10     Time  6    Period  Weeks    Status  On-going      PT LONG  TERM GOAL #3   Title  Patient will demosntrate a worst pain of 1/10 to indicate significant improvement in pain and improvement overall with hip pain    Baseline  3/10; 06/02/2018: 3/10; 08/17/2018: 8/10    Time  6    Period  Weeks    Status  On-going            Plan - 08/17/18 1355    Clinical Impression Statement  Patient demonstrates a basic regression towards goals s/p COVID crisis. Patient demonstrates improvement with ability to ambulate for longer distances, however, has been having increased pain with performance of walking and lying on each affected side. Patient demonstrates reproduction of symptoms with hip abduction indicating glute med involvement and will benefit from further skilled therapy directed towards decreasing muscular guarding of that muscle. Patient will benefit from further skilled therapy to return to prior level of function.     Personal Factors and Comorbidities  Age    Rehab Potential  Good    Clinical Impairments Affecting Rehab Potential  (+) Motivated, enjoys physical activity (-) Multiple comorbidities, hx of smoking, chronic pain    PT Frequency  2x / week    PT Duration  6 weeks    PT Treatment/Interventions  ADLs/Self Care Home Management;Cryotherapy;Electrical Stimulation;Moist Heat;Traction;Gait training;Stair training;Functional mobility training;Therapeutic activities;Therapeutic exercise;Neuromuscular re-education;Balance training;Patient/family education;Manual techniques;Energy conservation;Spinal Manipulations;Joint Manipulations;Taping    PT Next Visit Plan  Reasses HEP, Hip ER strength,     PT Home Exercise Plan  Walk 3/4 mile, side-lying hip abduction    Consulted and Agree with Plan of Care  Patient       Patient will benefit from skilled therapeutic intervention in order to improve the following deficits and  impairments:  Improper body mechanics, Pain, Increased muscle spasms, Decreased activity tolerance, Decreased endurance, Decreased strength  Visit Diagnosis: Muscle weakness (generalized)  Chronic hip pain, bilateral     Problem List Patient Active Problem List   Diagnosis Date Noted  . Aortic atherosclerosis ([place]) 01/19/2017  . Lymphocytic colitis 12/07/2016  . Noninfectious diarrhea   . Diarrhea of presumed infectious origin 06/26/2016  . Coronary artery disease involving native coronary artery of native heart without angina pectoris 07/22/2015  . Personal history of tobacco use, presenting hazards to health 06/26/2015  . Rectal polyp   . History of smoking 30 or more pack years 05/17/2015  . Personal history of nicotine dependence 05/17/2015  . Constipation 02/13/2015  . Cramp in lower leg 02/12/2015  . Difficulty hearing 02/12/2015  . Acid reflux 02/12/2015  . B12 deficiency 02/12/2015  . Abnormal blood sugar 02/12/2015  . Hearing loss 02/12/2015  . Hypothyroidism 09/10/2014  . Insomnia 09/05/2014  . Other specified postprocedural states 04/18/2013  . S/P breast reconstruction, left 04/18/2013  . Personal history of malignant neoplasm of breast 06/20/2012  . History of abnormal Pap smear 06/20/2012  . Benign hypertension 07/01/2009  . Essential (primary) hypertension 07/01/2009  . Genital herpes 03/26/2009  . 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 Hypercholesterolemia without hypertriglyceridemia 02/21/2009  . Allergic rhinitis 02/21/2009    Blythe Stanford, PT DPT 08/17/2018, 1:58 PM  [facility] [address] [place], [address] Phone: [phone]   Fax:  [phone]  Name: Adriana Hicks MRN: [id number] Date of Birth: 08-19-1945

## 2018-08-17 NOTE — Telephone Encounter (Signed)
Patient will call back to reschedule aex.

## 2018-08-18 ENCOUNTER — Other Ambulatory Visit: Payer: Self-pay | Admitting: Certified Nurse Midwife

## 2018-08-18 DIAGNOSIS — Z1231 Encounter for screening mammogram for malignant neoplasm of breast: Secondary | ICD-10-CM

## 2018-08-22 ENCOUNTER — Ambulatory Visit: Payer: Federal, State, Local not specified - PPO | Attending: Student

## 2018-08-22 ENCOUNTER — Other Ambulatory Visit: Payer: Self-pay

## 2018-08-22 DIAGNOSIS — M25551 Pain in right hip: Secondary | ICD-10-CM | POA: Diagnosis present

## 2018-08-22 DIAGNOSIS — M25552 Pain in left hip: Secondary | ICD-10-CM | POA: Diagnosis present

## 2018-08-22 DIAGNOSIS — G8929 Other chronic pain: Secondary | ICD-10-CM | POA: Diagnosis present

## 2018-08-22 DIAGNOSIS — M6281 Muscle weakness (generalized): Secondary | ICD-10-CM

## 2018-08-22 NOTE — Therapy (Signed)
[facility] [address] [place], [address] Phone: [phone]   Fax:  [phone]  Physical Therapy Treatment  Patient Details  Name: Adriana Hicks MRN: [id number] Date of Birth: 1946-03-23 Referring Provider (PT): [name]   Encounter Date: 08/22/2018  PT End of Session - 08/22/18 1354    Visit Number  12    Number of Visits  21    Date for PT Re-Evaluation  09/14/18    PT Start Time  1300    PT Stop Time  1345    PT Time Calculation (min)  45 min    Activity Tolerance  Patient tolerated treatment well    Behavior During Therapy  [facility] for tasks assessed/performed       Past Medical History:  Diagnosis Date  . Abnormal Pap smear of cervix 3/15   ASCUS HPV not detected  . ADD (attention deficit disorder)   . ADD (attention deficit disorder)   . Anemia   . Arthritis    fingers  . Breast cancer ([place])    Left; Age 73  . Colitis   . Genital herpes 03/26/2009  . GERD (gastroesophageal reflux disease)   . History of chest pain    Related to reflux  . HSV-2 (herpes simplex virus 2) infection   . Hyperlipidemia   . Hypertension   . Hypothyroidism   . Personal history of chemotherapy   . Personal history of tobacco use, presenting hazards to health 06/26/2015  . S/P appendectomy 1981  . Seborrheic dermatitis   . Vertigo    x1 - approx 5 yrs ago    Past Surgical History:  Procedure Laterality Date  . APPENDECTOMY    . AUGMENTATION MAMMAPLASTY  1982  . AUGMENTATION MAMMAPLASTY Bilateral 1950   silicone gel  . BREAST IMPLANT EXCHANGE Bilateral 04/21/2016   Procedure: REMOVAL OF BREAST IMPLANTS WITH IMMEDIATE REPLACMENTOF BREAST IMPLANTS;  Surgeon: Youlanda Roys, MD;  Location: [place];  Service: Plastics;  Laterality: Bilateral;  . BREAST SURGERY  2005   Breast Implant replaceddue to leaking  . BREAST SURGERY Left 2006   due to asymmetry  . CARDIAC CATHETERIZATION      2011 at [facility] - reflux related  . CERVICAL BIOPSY  W/ LOOP ELECTRODE EXCISION     unsure pathology   . CESAREAN SECTION  1985  . COLONOSCOPY WITH PROPOFOL N/A 06/17/2015   Procedure: COLONOSCOPY WITH PROPOFOL;  Surgeon: Lucilla Lame, MD;  Location: [place];  Service: Endoscopy;  Laterality: N/A;  PT PREFERS EARLY AM  . COLONOSCOPY WITH PROPOFOL N/A 07/16/2016   Procedure: COLONOSCOPY WITH PROPOFOL;  Surgeon: Lucilla Lame, MD;  Location: [place];  Service: Endoscopy;  Laterality: N/A;  requests early as possible  . COSMETIC SURGERY     eyelid surgery  . DILATION AND CURETTAGE OF UTERUS    . ENDOMETRIAL BIOPSY    . MASTECTOMY Left    new implant on the Right  . MASTOPEXY Right 04/21/2016   Procedure: MASTOPEXY;  Surgeon: Youlanda Roys, MD;  Location: [place];  Service: Plastics;  Laterality: Right;  . POLYPECTOMY  06/17/2015   Procedure: POLYPECTOMY;  Surgeon: Lucilla Lame, MD;  Location: [place];  Service: Endoscopy;;  . TONSILLECTOMY  child  . TUBAL LIGATION      There were no vitals filed for this visit.  Subjective Assessment - 08/22/18 1321    Subjective  Patient states she felt a bit better after the previous session but states her hip continues to wake her up in the middle of the night around 4am.    Limitations  Walking;Lifting    How long can you sit comfortably?  Unlimited    How long can you stand comfortably?  Unlimited    How long can you walk comfortably?  ~1 mile    Patient Stated Goals  Return to walking, no pain when sleeping    Currently in Pain?  No/denies    Pain Onset  More than a month ago       TREATMENT Therapeutic Exercise: Hip circles with RTB around knees  - x 30sec cw/ccw B Leg swings side to side - x20 Hip sliders in semi circle with furniture slider - x 20 Leg kicks from bent knee to out straight - x20 Single leg toe touches on the affected - x 20  Heel taps from a 4" step with  UE support - x 20  Side lunges statically - x 20  Warrior 3 hip extention with two finger stabilization for balance - x 15 Running man in standing - x 20 off of airex pad Hip abduction in sidelying - x 20   Manual Therapy (unbilled) Ischemic compression along the lateral hip musculature along the glute med B to decrease increased pain and spasms with patient positioned in sidelying.     Patient demonstrates increased fatigue along both sides B. Focused on hip performing exercises focused on improving hip abductors to decrease pain and muscular guarding. Decreased pain after manual therapy.    PT Education - 08/22/18 1354    Education Details  reviewed exercise with slider, hip abduction with exercise    Person(s) Educated  Patient    Methods  Explanation;Demonstration    Comprehension  Verbalized understanding;Returned demonstration       PT Short Term Goals - 04/27/18 1507      PT SHORT TERM GOAL #1   Title  Pt will have 100% compliance with HEP to improve hip strength and walking tolerance    Baseline  04/27/2018: Side-lying hip abduction and wlkaing 3/4 mile every other day    Time  2    Period  Weeks    Status  New    Target Date  05/11/18        PT Long Term Goals - 08/17/18 1307      PT LONG TERM GOAL #1   Title  Pt will have a decrease of at least 3 points on NPRS when sleeping to demonstrate improvement in therapy.    Baseline  04/27/2018: 5/10 NPRS; 06/02/2018: 3/10; 08/17/2018: 8/10    Time  6    Period  Weeks    Status  On-going      PT LONG TERM GOAL #2   Title  Pt will be able to walk 2 miles with no pain to return to recreational activities with friends.    Baseline  04/27/2018: 5/10 NPRS when walking 1 mile; 2/10 with walking 1 mile; 08/17/2018: Able to ambulate 5 miles with 5/10     Time  6    Period  Weeks    Status  On-going      PT LONG TERM GOAL #3   Title  Patient will demosntrate a worst pain of 1/10 to indicate significant improvement in pain and  improvement overall with hip pain    Baseline  3/10; 06/02/2018: 3/10; 08/17/2018: 8/10  Time  6    Period  Weeks    Status  On-going            Plan - 08/22/18 1355    Clinical Impression Statement  Patient demonstrates improvement in pain and spasms after performing manual therapy to the affected area indicating decreased muscular guarding and less protection response from the nervous system. Able to perform greater hip hinging with exercises indicating functional carryover between sessions. Although patient is improving, she continues to have pain at night. Patient will benefit from further skilled therapy to return to prior level of function.     Personal Factors and Comorbidities  Age    Rehab Potential  Good    Clinical Impairments Affecting Rehab Potential  (+) Motivated, enjoys physical activity (-) Multiple comorbidities, hx of smoking, chronic pain    PT Frequency  2x / week    PT Duration  6 weeks    PT Treatment/Interventions  ADLs/Self Care Home Management;Cryotherapy;Electrical Stimulation;Moist Heat;Traction;Gait training;Stair training;Functional mobility training;Therapeutic activities;Therapeutic exercise;Neuromuscular re-education;Balance training;Patient/family education;Manual techniques;Energy conservation;Spinal Manipulations;Joint Manipulations;Taping    PT Next Visit Plan  Reasses HEP, Hip ER strength,     PT Home Exercise Plan  Walk 3/4 mile, side-lying hip abduction    Consulted and Agree with Plan of Care  Patient       Patient will benefit from skilled therapeutic intervention in order to improve the following deficits and impairments:  Improper body mechanics, Pain, Increased muscle spasms, Decreased activity tolerance, Decreased endurance, Decreased strength  Visit Diagnosis: Muscle weakness (generalized)  Chronic hip pain, bilateral     Problem List Patient Active Problem List   Diagnosis Date Noted  . Aortic atherosclerosis ([place]) 01/19/2017  .  Lymphocytic colitis 12/07/2016  . Noninfectious diarrhea   . Diarrhea of presumed infectious origin 06/26/2016  . Coronary artery disease involving native coronary artery of native heart without angina pectoris 07/22/2015  . Personal history of tobacco use, presenting hazards to health 06/26/2015  . Rectal polyp   . History of smoking 30 or more pack years 05/17/2015  . Personal history of nicotine dependence 05/17/2015  . Constipation 02/13/2015  . Cramp in lower leg 02/12/2015  . Difficulty hearing 02/12/2015  . Acid reflux 02/12/2015  . B12 deficiency 02/12/2015  . Abnormal blood sugar 02/12/2015  . Hearing loss 02/12/2015  . Hypothyroidism 09/10/2014  . Insomnia 09/05/2014  . Other specified postprocedural states 04/18/2013  . S/P breast reconstruction, left 04/18/2013  . Personal history of malignant neoplasm of breast 06/20/2012  . History of abnormal Pap smear 06/20/2012  . Benign hypertension 07/01/2009  . Essential (primary) hypertension 07/01/2009  . Genital herpes 03/26/2009  . 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 Hypercholesterolemia without hypertriglyceridemia 02/21/2009  . Allergic rhinitis 02/21/2009    Blythe Stanford, PT DPT 08/22/2018, 1:59 PM  [facility] [address] [place], [address] Phone: [phone]   Fax:  [phone]  Name: Adriana Hicks MRN: [id number] Date of Birth: [birth year]

## 2018-08-24 ENCOUNTER — Other Ambulatory Visit: Payer: Self-pay

## 2018-08-24 ENCOUNTER — Ambulatory Visit: Payer: Federal, State, Local not specified - PPO

## 2018-08-24 DIAGNOSIS — M6281 Muscle weakness (generalized): Secondary | ICD-10-CM

## 2018-08-24 DIAGNOSIS — G8929 Other chronic pain: Secondary | ICD-10-CM

## 2018-08-24 DIAGNOSIS — M25552 Pain in left hip: Secondary | ICD-10-CM

## 2018-08-24 NOTE — Therapy (Signed)
[facility] [address] [place], [address] Phone: [phone]   Fax:  [phone]  Physical Therapy Treatment  Patient Details  Name: Adriana Hicks MRN: [id number] Date of Birth: 04-05-1945 Referring Provider (PT): [name]   Encounter Date: 08/24/2018  PT End of Session - 08/24/18 1320    Visit Number  13    Number of Visits  21    Date for PT Re-Evaluation  09/14/18    PT Start Time  1300    PT Stop Time  1345    PT Time Calculation (min)  45 min    Activity Tolerance  Patient tolerated treatment well    Behavior During Therapy  [facility] for tasks assessed/performed       Past Medical History:  Diagnosis Date  . Abnormal Pap smear of cervix 3/15   ASCUS HPV not detected  . ADD (attention deficit disorder)   . ADD (attention deficit disorder)   . Anemia   . Arthritis    fingers  . Breast cancer ([place])    Left; Age 8  . Colitis   . Genital herpes 03/26/2009  . GERD (gastroesophageal reflux disease)   . History of chest pain    Related to reflux  . HSV-2 (herpes simplex virus 2) infection   . Hyperlipidemia   . Hypertension   . Hypothyroidism   . Personal history of chemotherapy   . Personal history of tobacco use, presenting hazards to health 06/26/2015  . S/P appendectomy 1981  . Seborrheic dermatitis   . Vertigo    x1 - approx 5 yrs ago    Past Surgical History:  Procedure Laterality Date  . APPENDECTOMY    . AUGMENTATION MAMMAPLASTY  1982  . AUGMENTATION MAMMAPLASTY Bilateral 2542   silicone gel  . BREAST IMPLANT EXCHANGE Bilateral 04/21/2016   Procedure: REMOVAL OF BREAST IMPLANTS WITH IMMEDIATE REPLACMENTOF BREAST IMPLANTS;  Surgeon: Youlanda Roys, MD;  Location: [place];  Service: Plastics;  Laterality: Bilateral;  . BREAST SURGERY  2005   Breast Implant replaceddue to leaking  . BREAST SURGERY Left 2006   due to asymmetry  . CARDIAC CATHETERIZATION      2011 at [facility] - reflux related  . CERVICAL BIOPSY  W/ LOOP ELECTRODE EXCISION     unsure pathology   . CESAREAN SECTION  1985  . COLONOSCOPY WITH PROPOFOL N/A 06/17/2015   Procedure: COLONOSCOPY WITH PROPOFOL;  Surgeon: Lucilla Lame, MD;  Location: [place];  Service: Endoscopy;  Laterality: N/A;  PT PREFERS EARLY AM  . COLONOSCOPY WITH PROPOFOL N/A 07/16/2016   Procedure: COLONOSCOPY WITH PROPOFOL;  Surgeon: Lucilla Lame, MD;  Location: [place];  Service: Endoscopy;  Laterality: N/A;  requests early as possible  . COSMETIC SURGERY     eyelid surgery  . DILATION AND CURETTAGE OF UTERUS    . ENDOMETRIAL BIOPSY    . MASTECTOMY Left    new implant on the Right  . MASTOPEXY Right 04/21/2016   Procedure: MASTOPEXY;  Surgeon: Youlanda Roys, MD;  Location: [place];  Service: Plastics;  Laterality: Right;  . POLYPECTOMY  06/17/2015   Procedure: POLYPECTOMY;  Surgeon: Lucilla Lame, MD;  Location: [place];  Service: Endoscopy;;  . TONSILLECTOMY  child  . TUBAL LIGATION      There were no vitals filed for this visit.  Subjective Assessment - 08/24/18 1317    Subjective  Patient reports she felt better after the previous session, however states increased pain last night when trying to go to sleep.     Limitations  Walking;Lifting    How long can you sit comfortably?  Unlimited    How long can you stand comfortably?  Unlimited    How long can you walk comfortably?  ~1 mile    Patient Stated Goals  Return to walking, no pain when sleeping    Currently in Pain?  No/denies    Pain Onset  More than a month ago       TREATMENT Therapeutic Exercise: Warrior 3 hip extention with two finger stabilization for balance to crescent moon - x 15 Leg swings side to side - x20 Hip sliders in semi circle with furniture slider - x 20 Cone taps with fingers tapping cones - x 5 performed B Cone taps with feet in standing - x 5 performed  B Running man in standing - x 20  Hip abduction in standing with RTB around ankles - x 20  Hip abduction in sidelying - x 20    Dry Needling (53min)  Dry needling performed to the glute med B with patient in sidelying to decrease increased pain and spasms. 2 (72mm needles) used during the session. Patient educated risk of performing dry needling and patient verbalizes consent to treatment.    Patient demonstrates increased fatigue along both sides B. Focused on hip performing exercises focused on improving hip abductors to decrease pain and muscular guarding. Decreased pain after manual therapy.   PT Education - 08/24/18 1319    Education Details  HEP: updated to added hip abduction right before bed    Person(s) Educated  Patient    Methods  Explanation;Demonstration    Comprehension  Verbalized understanding;Returned demonstration       PT Short Term Goals - 04/27/18 1507      PT SHORT TERM GOAL #1   Title  Pt will have 100% compliance with HEP to improve hip strength and walking tolerance    Baseline  04/27/2018: Side-lying hip abduction and wlkaing 3/4 mile every other day    Time  2    Period  Weeks    Status  New    Target Date  05/11/18        PT Long Term Goals - 08/17/18 1307      PT LONG TERM GOAL #1   Title  Pt will have a decrease of at least 3 points on NPRS when sleeping to demonstrate improvement in therapy.    Baseline  04/27/2018: 5/10 NPRS; 06/02/2018: 3/10; 08/17/2018: 8/10    Time  6    Period  Weeks    Status  On-going      PT LONG TERM GOAL #2   Title  Pt will be able to walk 2 miles with no pain to return to recreational activities with friends.    Baseline  04/27/2018: 5/10 NPRS when walking 1 mile; 2/10 with walking 1 mile; 08/17/2018: Able to ambulate 5 miles with 5/10     Time  6    Period  Weeks    Status  On-going      PT LONG TERM GOAL #3   Title  Patient will demosntrate a worst pain of 1/10 to indicate significant improvement in pain and  improvement overall with hip pain    Baseline  3/10; 06/02/2018: 3/10; 08/17/2018: 8/10    Time  6    Period  Weeks  Status  On-going            Plan - 08/24/18 1330    Clinical Impression Statement  Patient demonstrates improvement in symptoms after the previous session however continues to have pain at night where it wakes her up in the middle of the night.  Patient continues to have pain at night but demonstrates some days where it does not bother her as much. Patient continues to have pain along her hip abductors and will benefit from further skilled therapy to return to prior level of function.     Personal Factors and Comorbidities  Age    Rehab Potential  Good    Clinical Impairments Affecting Rehab Potential  (+) Motivated, enjoys physical activity (-) Multiple comorbidities, hx of smoking, chronic pain    PT Frequency  2x / week    PT Duration  6 weeks    PT Treatment/Interventions  ADLs/Self Care Home Management;Cryotherapy;Electrical Stimulation;Moist Heat;Traction;Gait training;Stair training;Functional mobility training;Therapeutic activities;Therapeutic exercise;Neuromuscular re-education;Balance training;Patient/family education;Manual techniques;Energy conservation;Spinal Manipulations;Joint Manipulations;Taping    PT Next Visit Plan  Reasses HEP, Hip ER strength,     PT Home Exercise Plan  Walk 3/4 mile, side-lying hip abduction    Consulted and Agree with Plan of Care  Patient       Patient will benefit from skilled therapeutic intervention in order to improve the following deficits and impairments:  Improper body mechanics, Pain, Increased muscle spasms, Decreased activity tolerance, Decreased endurance, Decreased strength  Visit Diagnosis: Muscle weakness (generalized)  Chronic hip pain, bilateral     Problem List Patient Active Problem List   Diagnosis Date Noted  . Aortic atherosclerosis ([place]) 01/19/2017  . Lymphocytic colitis 12/07/2016  .  Noninfectious diarrhea   . Diarrhea of presumed infectious origin 06/26/2016  . Coronary artery disease involving native coronary artery of native heart without angina pectoris 07/22/2015  . Personal history of tobacco use, presenting hazards to health 06/26/2015  . Rectal polyp   . History of smoking 30 or more pack years 05/17/2015  . Personal history of nicotine dependence 05/17/2015  . Constipation 02/13/2015  . Cramp in lower leg 02/12/2015  . Difficulty hearing 02/12/2015  . Acid reflux 02/12/2015  . B12 deficiency 02/12/2015  . Abnormal blood sugar 02/12/2015  . Hearing loss 02/12/2015  . Hypothyroidism 09/10/2014  . Insomnia 09/05/2014  . Other specified postprocedural states 04/18/2013  . S/P breast reconstruction, left 04/18/2013  . Personal history of malignant neoplasm of breast 06/20/2012  . History of abnormal Pap smear 06/20/2012  . Benign hypertension 07/01/2009  . Essential (primary) hypertension 07/01/2009  . Genital herpes 03/26/2009  . 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 Hypercholesterolemia without hypertriglyceridemia 02/21/2009  . Allergic rhinitis 02/21/2009    Blythe Stanford, PT DPT 08/24/2018, 2:32 PM  [facility] [address] [place], [address] Phone: [phone]   Fax:  [phone]  Name: Adriana Hicks MRN: [id number] Date of Birth: 1945-08-28

## 2018-08-25 ENCOUNTER — Ambulatory Visit: Payer: Federal, State, Local not specified - PPO | Admitting: Certified Nurse Midwife

## 2018-08-29 ENCOUNTER — Ambulatory Visit: Payer: Federal, State, Local not specified - PPO

## 2018-08-31 ENCOUNTER — Other Ambulatory Visit: Payer: Self-pay

## 2018-08-31 ENCOUNTER — Ambulatory Visit: Payer: Federal, State, Local not specified - PPO

## 2018-08-31 DIAGNOSIS — G8929 Other chronic pain: Secondary | ICD-10-CM

## 2018-08-31 DIAGNOSIS — M6281 Muscle weakness (generalized): Secondary | ICD-10-CM | POA: Diagnosis not present

## 2018-08-31 NOTE — Therapy (Signed)
[facility] [address] [place], [address] Phone: [phone]   Fax:  [phone]  Physical Therapy Treatment  Patient Details  Name: Adriana Hicks MRN: [id number] Date of Birth: 1945-08-28 Referring Provider (PT): [name]   Encounter Date: 08/31/2018  PT End of Session - 08/31/18 1323    Visit Number  14    Number of Visits  21    Date for PT Re-Evaluation  09/14/18    PT Start Time  1300    PT Stop Time  1345    PT Time Calculation (min)  45 min    Activity Tolerance  Patient tolerated treatment well    Behavior During Therapy  [facility] for tasks assessed/performed       Past Medical History:  Diagnosis Date  . Abnormal Pap smear of cervix 3/15   ASCUS HPV not detected  . ADD (attention deficit disorder)   . ADD (attention deficit disorder)   . Anemia   . Arthritis    fingers  . Breast cancer ([place])    Left; Age 52  . Colitis   . Genital herpes 03/26/2009  . GERD (gastroesophageal reflux disease)   . History of chest pain    Related to reflux  . HSV-2 (herpes simplex virus 2) infection   . Hyperlipidemia   . Hypertension   . Hypothyroidism   . Personal history of chemotherapy   . Personal history of tobacco use, presenting hazards to health 06/26/2015  . S/P appendectomy 1981  . Seborrheic dermatitis   . Vertigo    x1 - approx 5 yrs ago    Past Surgical History:  Procedure Laterality Date  . APPENDECTOMY    . AUGMENTATION MAMMAPLASTY  1982  . AUGMENTATION MAMMAPLASTY Bilateral 1962   silicone gel  . BREAST IMPLANT EXCHANGE Bilateral 04/21/2016   Procedure: REMOVAL OF BREAST IMPLANTS WITH IMMEDIATE REPLACMENTOF BREAST IMPLANTS;  Surgeon: Youlanda Roys, MD;  Location: [place];  Service: Plastics;  Laterality: Bilateral;  . BREAST SURGERY  2005   Breast Implant replaceddue to leaking  . BREAST SURGERY Left 2006   due to asymmetry  . CARDIAC CATHETERIZATION      2011 at [facility] - reflux related  . CERVICAL BIOPSY  W/ LOOP ELECTRODE EXCISION     unsure pathology   . CESAREAN SECTION  1985  . COLONOSCOPY WITH PROPOFOL N/A 06/17/2015   Procedure: COLONOSCOPY WITH PROPOFOL;  Surgeon: Lucilla Lame, MD;  Location: [place];  Service: Endoscopy;  Laterality: N/A;  PT PREFERS EARLY AM  . COLONOSCOPY WITH PROPOFOL N/A 07/16/2016   Procedure: COLONOSCOPY WITH PROPOFOL;  Surgeon: Lucilla Lame, MD;  Location: [place];  Service: Endoscopy;  Laterality: N/A;  requests early as possible  . COSMETIC SURGERY     eyelid surgery  . DILATION AND CURETTAGE OF UTERUS    . ENDOMETRIAL BIOPSY    . MASTECTOMY Left    new implant on the Right  . MASTOPEXY Right 04/21/2016   Procedure: MASTOPEXY;  Surgeon: Youlanda Roys, MD;  Location: [place];  Service: Plastics;  Laterality: Right;  . POLYPECTOMY  06/17/2015   Procedure: POLYPECTOMY;  Surgeon: Lucilla Lame, MD;  Location: [place];  Service: Endoscopy;;  . TONSILLECTOMY  child  . TUBAL LIGATION      There were no vitals filed for this visit.  Subjective Assessment - 08/31/18 1311    Subjective  Patient states she has been sleeping better after taking her celebrex. Patient states her hips have been slowly improving with exercise.    Limitations  Walking;Lifting    How long can you sit comfortably?  Unlimited    How long can you stand comfortably?  Unlimited    How long can you walk comfortably?  ~1 mile    Patient Stated Goals  Return to walking, no pain when sleeping    Currently in Pain?  No/denies    Pain Onset  More than a month ago          Therapeutic Exercise: Cone taps with fingers tapping cones - x 5 performed B Cone taps with feet in standing - x 5 performed B Monster walks sideways - 6 x 37ft S/L hip abduction - x 20 Running man in standing - x 20 with foot on airex pad Hip hiking B off of airex pad - x 20  Warrior 3 hip  extention with two finger stabilization for balance -- x 15 Hip circles in standing - 2 x 15sec  Hops forward/sideways -- 2 x 1 min (Both directions) Single leg stance hip rotations in standing -- x15    Patient demonstrates increased fatigue along both sides B. Focused on hip performing exercises focused on improving hip abductors to decrease pain and muscular guarding. Decreased pain after manual therapy.  PT Education - 08/31/18 1314    Education Details  form/technique with exercise    Person(s) Educated  Patient    Methods  Explanation;Demonstration    Comprehension  Verbalized understanding;Returned demonstration       PT Short Term Goals - 04/27/18 1507      PT SHORT TERM GOAL #1   Title  Pt will have 100% compliance with HEP to improve hip strength and walking tolerance    Baseline  04/27/2018: Side-lying hip abduction and wlkaing 3/4 mile every other day    Time  2    Period  Weeks    Status  New    Target Date  05/11/18        PT Long Term Goals - 08/17/18 1307      PT LONG TERM GOAL #1   Title  Pt will have a decrease of at least 3 points on NPRS when sleeping to demonstrate improvement in therapy.    Baseline  04/27/2018: 5/10 NPRS; 06/02/2018: 3/10; 08/17/2018: 8/10    Time  6    Period  Weeks    Status  On-going      PT LONG TERM GOAL #2   Title  Pt will be able to walk 2 miles with no pain to return to recreational activities with friends.    Baseline  04/27/2018: 5/10 NPRS when walking 1 mile; 2/10 with walking 1 mile; 08/17/2018: Able to ambulate 5 miles with 5/10     Time  6    Period  Weeks    Status  On-going      PT LONG TERM GOAL #3   Title  Patient will demosntrate a worst pain of 1/10 to indicate significant improvement in pain and improvement overall with hip pain    Baseline  3/10; 06/02/2018: 3/10; 08/17/2018: 8/10    Time  6    Period  Weeks    Status  On-going            Plan - 08/31/18 1324    Clinical Impression Statement  Continued to  address hip weakness and coordination among  the hips with exercises to improve glute med tendonapathy. Patient is making consistent improvement and will benefit from further skilled therapy to return to prior level of function.     Personal Factors and Comorbidities  Age    Rehab Potential  Good    Clinical Impairments Affecting Rehab Potential  (+) Motivated, enjoys physical activity (-) Multiple comorbidities, hx of smoking, chronic pain    PT Frequency  2x / week    PT Duration  6 weeks    PT Treatment/Interventions  ADLs/Self Care Home Management;Cryotherapy;Electrical Stimulation;Moist Heat;Traction;Gait training;Stair training;Functional mobility training;Therapeutic activities;Therapeutic exercise;Neuromuscular re-education;Balance training;Patient/family education;Manual techniques;Energy conservation;Spinal Manipulations;Joint Manipulations;Taping    PT Next Visit Plan  Reasses HEP, Hip ER strength,     PT Home Exercise Plan  Walk 3/4 mile, side-lying hip abduction    Consulted and Agree with Plan of Care  Patient       Patient will benefit from skilled therapeutic intervention in order to improve the following deficits and impairments:  Improper body mechanics, Pain, Increased muscle spasms, Decreased activity tolerance, Decreased endurance, Decreased strength  Visit Diagnosis: Muscle weakness (generalized)  Chronic hip pain, bilateral     Problem List Patient Active Problem List   Diagnosis Date Noted  . Aortic atherosclerosis ([place]) 01/19/2017  . Lymphocytic colitis 12/07/2016  . Noninfectious diarrhea   . Diarrhea of presumed infectious origin 06/26/2016  . Coronary artery disease involving native coronary artery of native heart without angina pectoris 07/22/2015  . Personal history of tobacco use, presenting hazards to health 06/26/2015  . Rectal polyp   . History of smoking 30 or more pack years 05/17/2015  . Personal history of nicotine dependence 05/17/2015  .  Constipation 02/13/2015  . Cramp in lower leg 02/12/2015  . Difficulty hearing 02/12/2015  . Acid reflux 02/12/2015  . B12 deficiency 02/12/2015  . Abnormal blood sugar 02/12/2015  . Hearing loss 02/12/2015  . Hypothyroidism 09/10/2014  . Insomnia 09/05/2014  . Other specified postprocedural states 04/18/2013  . S/P breast reconstruction, left 04/18/2013  . Personal history of malignant neoplasm of breast 06/20/2012  . History of abnormal Pap smear 06/20/2012  . Benign hypertension 07/01/2009  . Essential (primary) hypertension 07/01/2009  . Genital herpes 03/26/2009  . 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 Hypercholesterolemia without hypertriglyceridemia 02/21/2009  . Allergic rhinitis 02/21/2009    Blythe Stanford, PT DPT 08/31/2018, 1:32 PM  [facility] [address] [place], [address] Phone: [phone]   Fax:  [phone]  Name: Adriana Hicks MRN: [id number] Date of Birth: 05-06-1945

## 2018-09-05 ENCOUNTER — Other Ambulatory Visit: Payer: Self-pay

## 2018-09-05 ENCOUNTER — Ambulatory Visit: Payer: Federal, State, Local not specified - PPO

## 2018-09-05 DIAGNOSIS — M25552 Pain in left hip: Secondary | ICD-10-CM

## 2018-09-05 DIAGNOSIS — G8929 Other chronic pain: Secondary | ICD-10-CM

## 2018-09-05 DIAGNOSIS — M6281 Muscle weakness (generalized): Secondary | ICD-10-CM | POA: Diagnosis not present

## 2018-09-05 NOTE — Therapy (Signed)
[facility] [address], [place], [address] Phone: [phone]   Fax:  [phone]  Physical Therapy Treatment  Patient Details  Name: TANNA LOEFFLER MRN: [id number] Date of Birth: 12-22-1945 Referring Provider (PT): [name]   Encounter Date: 09/05/2018  PT End of Session - 09/05/18 1355    Visit Number  15    Number of Visits  21    Date for PT Re-Evaluation  09/14/18    PT Start Time  1300    PT Stop Time  1347    PT Time Calculation (min)  47 min    Activity Tolerance  Patient tolerated treatment well    Behavior During Therapy  [facility] for tasks assessed/performed       Past Medical History:  Diagnosis Date  . Abnormal Pap smear of cervix 3/15   ASCUS HPV not detected  . ADD (attention deficit disorder)   . ADD (attention deficit disorder)   . Anemia   . Arthritis    fingers  . Breast cancer ([place])    Left; Age 65  . Colitis   . Genital herpes 03/26/2009  . GERD (gastroesophageal reflux disease)   . History of chest pain    Related to reflux  . HSV-2 (herpes simplex virus 2) infection   . Hyperlipidemia   . Hypertension   . Hypothyroidism   . Personal history of chemotherapy   . Personal history of tobacco use, presenting hazards to health 06/26/2015  . S/P appendectomy 1981  . Seborrheic dermatitis   . Vertigo    x1 - approx 5 yrs ago    Past Surgical History:  Procedure Laterality Date  . APPENDECTOMY    . AUGMENTATION MAMMAPLASTY  1982  . AUGMENTATION MAMMAPLASTY Bilateral 3818   silicone gel  . BREAST IMPLANT EXCHANGE Bilateral 04/21/2016   Procedure: REMOVAL OF BREAST IMPLANTS WITH IMMEDIATE REPLACMENTOF BREAST IMPLANTS;  Surgeon: Youlanda Roys, MD;  Location: [place];  Service: Plastics;  Laterality: Bilateral;  . BREAST SURGERY  2005   Breast Implant replaceddue to leaking  . BREAST SURGERY Left 2006   due to asymmetry  . CARDIAC CATHETERIZATION      2011 at [facility] - reflux related  . CERVICAL BIOPSY  W/ LOOP ELECTRODE EXCISION     unsure pathology   . CESAREAN SECTION  1985  . COLONOSCOPY WITH PROPOFOL N/A 06/17/2015   Procedure: COLONOSCOPY WITH PROPOFOL;  Surgeon: Lucilla Lame, MD;  Location: [place];  Service: Endoscopy;  Laterality: N/A;  PT PREFERS EARLY AM  . COLONOSCOPY WITH PROPOFOL N/A 07/16/2016   Procedure: COLONOSCOPY WITH PROPOFOL;  Surgeon: Lucilla Lame, MD;  Location: [place];  Service: Endoscopy;  Laterality: N/A;  requests early as possible  . COSMETIC SURGERY     eyelid surgery  . DILATION AND CURETTAGE OF UTERUS    . ENDOMETRIAL BIOPSY    . MASTECTOMY Left    new implant on the Right  . MASTOPEXY Right 04/21/2016   Procedure: MASTOPEXY;  Surgeon: Youlanda Roys, MD;  Location: [place];  Service: Plastics;  Laterality: Right;  . POLYPECTOMY  06/17/2015   Procedure: POLYPECTOMY;  Surgeon: Lucilla Lame, MD;  Location: [place];  Service: Endoscopy;;  . TONSILLECTOMY  child  . TUBAL LIGATION      There were no vitals filed for this visit.  Subjective Assessment - 09/05/18 1317    Subjective  Patient states she continues to have pain at night and reports she has been performing hip abduction before bed. Patient reports no significant change in symptoms.    Limitations  Walking;Lifting    How long can you sit comfortably?  Unlimited    How long can you stand comfortably?  Unlimited    How long can you walk comfortably?  ~1 mile    Patient Stated Goals  Return to walking, no pain when sleeping    Currently in Pain?  No/denies    Pain Onset  More than a month ago       TREATMENT Therapeutic Exercise Hip abduction in standing with GTB - x 20  Running man off of airex pad - x 20  Hip hikes off of airex pad - x 20  Hip abduction/extension circles with GTB - x 20  Hip abduction in sidelying - x10 Sidelying bridge with abduction on the  contralateral side - x 20 Sitting hip IR with pillow between the knees and GTB around ankles - x 20  Manual therapy STM performed to the hip abductors in sidelying to decrease pain and spasms along the hip musculature. Utilized ART along the glute med to decrease increased pain and spasms. - performed manual therapy -- x 21 min  Performed manual therapy to decrease pain and spams along the hips. Performed hip exercises to improve pain and spasms.    PT Education - 09/05/18 1347    Education Details  form/technique with exercise    Person(s) Educated  Patient    Methods  Explanation;Demonstration    Comprehension  Verbalized understanding;Returned demonstration       PT Short Term Goals - 04/27/18 1507      PT SHORT TERM GOAL #1   Title  Pt will have 100% compliance with HEP to improve hip strength and walking tolerance    Baseline  04/27/2018: Side-lying hip abduction and wlkaing 3/4 mile every other day    Time  2    Period  Weeks    Status  New    Target Date  05/11/18        PT Long Term Goals - 08/17/18 1307      PT LONG TERM GOAL #1   Title  Pt will have a decrease of at least 3 points on NPRS when sleeping to demonstrate improvement in therapy.    Baseline  04/27/2018: 5/10 NPRS; 06/02/2018: 3/10; 08/17/2018: 8/10    Time  6    Period  Weeks    Status  On-going      PT LONG TERM GOAL #2   Title  Pt will be able to walk 2 miles with no pain to return to recreational activities with friends.    Baseline  04/27/2018: 5/10 NPRS when walking 1 mile; 2/10 with walking 1 mile; 08/17/2018: Able to ambulate 5 miles with 5/10     Time  6    Period  Weeks    Status  On-going      PT LONG TERM GOAL #3   Title  Patient will demosntrate a worst pain of 1/10 to indicate significant improvement in pain and improvement overall with hip pain    Baseline  3/10; 06/02/2018: 3/10; 08/17/2018: 8/10    Time  6    Period  Weeks    Status  On-going            Plan - 09/05/18 1433     Clinical Impression Statement  Performed greater  amount of manual therapy today to help decrease the pain adn spasms along the glute med. Patient demonstrates no increase in pain after performing manual therapy and then performed exercises to help improve the muscular guarding of the affected musculature. Patient will benefit from further skilled therapy to return to prior level of function.    Personal Factors and Comorbidities  Age    Rehab Potential  Good    Clinical Impairments Affecting Rehab Potential  (+) Motivated, enjoys physical activity (-) Multiple comorbidities, hx of smoking, chronic pain    PT Frequency  2x / week    PT Duration  6 weeks    PT Treatment/Interventions  ADLs/Self Care Home Management;Cryotherapy;Electrical Stimulation;Moist Heat;Traction;Gait training;Stair training;Functional mobility training;Therapeutic activities;Therapeutic exercise;Neuromuscular re-education;Balance training;Patient/family education;Manual techniques;Energy conservation;Spinal Manipulations;Joint Manipulations;Taping    PT Next Visit Plan  Reasses HEP, Hip ER strength,     PT Home Exercise Plan  Walk 3/4 mile, side-lying hip abduction    Consulted and Agree with Plan of Care  Patient       Patient will benefit from skilled therapeutic intervention in order to improve the following deficits and impairments:  Improper body mechanics, Pain, Increased muscle spasms, Decreased activity tolerance, Decreased endurance, Decreased strength  Visit Diagnosis: Muscle weakness (generalized)  Pain in left hip  Chronic hip pain, bilateral     Problem List Patient Active Problem List   Diagnosis Date Noted  . Aortic atherosclerosis ([place]) 01/19/2017  . Lymphocytic colitis 12/07/2016  . Noninfectious diarrhea   . Diarrhea of presumed infectious origin 06/26/2016  . Coronary artery disease involving native coronary artery of native heart without angina pectoris 07/22/2015  . Personal history of  tobacco use, presenting hazards to health 06/26/2015  . Rectal polyp   . History of smoking 30 or more pack years 05/17/2015  . Personal history of nicotine dependence 05/17/2015  . Constipation 02/13/2015  . Cramp in lower leg 02/12/2015  . Difficulty hearing 02/12/2015  . Acid reflux 02/12/2015  . B12 deficiency 02/12/2015  . Abnormal blood sugar 02/12/2015  . Hearing loss 02/12/2015  . Hypothyroidism 09/10/2014  . Insomnia 09/05/2014  . Other specified postprocedural states 04/18/2013  . S/P breast reconstruction, left 04/18/2013  . Personal history of malignant neoplasm of breast 06/20/2012  . History of abnormal Pap smear 06/20/2012  . Benign hypertension 07/01/2009  . Essential (primary) hypertension 07/01/2009  . Genital herpes 03/26/2009  . 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 Hypercholesterolemia without hypertriglyceridemia 02/21/2009  . Allergic rhinitis 02/21/2009    Blythe Stanford, PT DPT 09/05/2018, 2:57 PM  [place] [address], [place], [address] Phone: [phone]   Fax:  [phone]  Name: KELIYAH SPILLMAN MRN: [id number] Date of Birth: September 12, 1945

## 2018-09-07 ENCOUNTER — Other Ambulatory Visit: Payer: Self-pay

## 2018-09-07 ENCOUNTER — Ambulatory Visit: Payer: Federal, State, Local not specified - PPO

## 2018-09-07 DIAGNOSIS — M6281 Muscle weakness (generalized): Secondary | ICD-10-CM | POA: Diagnosis not present

## 2018-09-07 DIAGNOSIS — G8929 Other chronic pain: Secondary | ICD-10-CM

## 2018-09-07 DIAGNOSIS — M25552 Pain in left hip: Secondary | ICD-10-CM

## 2018-09-07 NOTE — Therapy (Signed)
[place] [facility] [address] [place], [address] Phone: [phone]   Fax:  [phone]  Physical Therapy Treatment  Patient Details  Name: Adriana Hicks MRN: [id number] Date of Birth: 1946-03-18 Referring Provider (PT): [name]   Encounter Date: 09/07/2018  PT End of Session - 09/07/18 1318    Visit Number  16    Number of Visits  21    Date for PT Re-Evaluation  09/14/18    PT Start Time  1300    PT Stop Time  1345    PT Time Calculation (min)  45 min    Activity Tolerance  Patient tolerated treatment well    Behavior During Therapy  [facility] for tasks assessed/performed       Past Medical History:  Diagnosis Date  . Abnormal Pap smear of cervix 3/15   ASCUS HPV not detected  . ADD (attention deficit disorder)   . ADD (attention deficit disorder)   . Anemia   . Arthritis    fingers  . Breast cancer ([place])    Left; Age 7  . Colitis   . Genital herpes 03/26/2009  . GERD (gastroesophageal reflux disease)   . History of chest pain    Related to reflux  . HSV-2 (herpes simplex virus 2) infection   . Hyperlipidemia   . Hypertension   . Hypothyroidism   . Personal history of chemotherapy   . Personal history of tobacco use, presenting hazards to health 06/26/2015  . S/P appendectomy 1981  . Seborrheic dermatitis   . Vertigo    x1 - approx 5 yrs ago    Past Surgical History:  Procedure Laterality Date  . APPENDECTOMY    . AUGMENTATION MAMMAPLASTY  1982  . AUGMENTATION MAMMAPLASTY Bilateral 8341   silicone gel  . BREAST IMPLANT EXCHANGE Bilateral 04/21/2016   Procedure: REMOVAL OF BREAST IMPLANTS WITH IMMEDIATE REPLACMENTOF BREAST IMPLANTS;  Surgeon: Youlanda Roys, MD;  Location: [place];  Service: Plastics;  Laterality: Bilateral;  . BREAST SURGERY  2005   Breast Implant replaceddue to leaking  . BREAST SURGERY Left 2006   due to asymmetry  . CARDIAC CATHETERIZATION      2011 at [facility] - reflux related  . CERVICAL BIOPSY  W/ LOOP ELECTRODE EXCISION     unsure pathology   . CESAREAN SECTION  1985  . COLONOSCOPY WITH PROPOFOL N/A 06/17/2015   Procedure: COLONOSCOPY WITH PROPOFOL;  Surgeon: Lucilla Lame, MD;  Location: [place];  Service: Endoscopy;  Laterality: N/A;  PT PREFERS EARLY AM  . COLONOSCOPY WITH PROPOFOL N/A 07/16/2016   Procedure: COLONOSCOPY WITH PROPOFOL;  Surgeon: Lucilla Lame, MD;  Location: [place];  Service: Endoscopy;  Laterality: N/A;  requests early as possible  . COSMETIC SURGERY     eyelid surgery  . DILATION AND CURETTAGE OF UTERUS    . ENDOMETRIAL BIOPSY    . MASTECTOMY Left    new implant on the Right  . MASTOPEXY Right 04/21/2016   Procedure: MASTOPEXY;  Surgeon: Youlanda Roys, MD;  Location: [place];  Service: Plastics;  Laterality: Right;  . POLYPECTOMY  06/17/2015   Procedure: POLYPECTOMY;  Surgeon: Lucilla Lame, MD;  Location: [place];  Service: Endoscopy;;  . TONSILLECTOMY  child  . TUBAL LIGATION      There were no vitals filed for this visit.  Subjective Assessment - 09/07/18 1309    Subjective  Patient states improvement in pain after performing the exercises before bed and performing manual therapy the previous session.    Limitations  Walking;Lifting    How long can you sit comfortably?  Unlimited    How long can you stand comfortably?  Unlimited    How long can you walk comfortably?  ~1 mile    Patient Stated Goals  Return to walking, no pain when sleeping    Currently in Pain?  No/denies    Pain Onset  More than a month ago         TREATMENT Therapeutic Exercise Hip abduction in standing with GTB - x 20 off of airex pad Running man off of airex pad - x 20  Hip hikes off of six inch step - x 20  Hip abduction in sidelying - x20 Single leg bridge - x 20 B  Manual therapy STM performed to the hip abductors in sidelying to decrease pain  and spasms along the hip musculature. Utilized ART along the glute med to decrease increased pain and spasms. - performed manual therapy -- x 20 min   Performed manual therapy to decrease pain and spams along the hips. Performed hip exercises to improve pain/spasms and strength along the lateral hip musculature B.           PT Education - 09/07/18 1317    Education Details  form/technique with exercise    Person(s) Educated  Patient    Methods  Explanation;Demonstration    Comprehension  Verbalized understanding;Returned demonstration       PT Short Term Goals - 04/27/18 1507      PT SHORT TERM GOAL #1   Title  Pt will have 100% compliance with HEP to improve hip strength and walking tolerance    Baseline  04/27/2018: Side-lying hip abduction and wlkaing 3/4 mile every other day    Time  2    Period  Weeks    Status  New    Target Date  05/11/18        PT Long Term Goals - 08/17/18 1307      PT LONG TERM GOAL #1   Title  Pt will have a decrease of at least 3 points on NPRS when sleeping to demonstrate improvement in therapy.    Baseline  04/27/2018: 5/10 NPRS; 06/02/2018: 3/10; 08/17/2018: 8/10    Time  6    Period  Weeks    Status  On-going      PT LONG TERM GOAL #2   Title  Pt will be able to walk 2 miles with no pain to return to recreational activities with friends.    Baseline  04/27/2018: 5/10 NPRS when walking 1 mile; 2/10 with walking 1 mile; 08/17/2018: Able to ambulate 5 miles with 5/10     Time  6    Period  Weeks    Status  On-going      PT LONG TERM GOAL #3   Title  Patient will demosntrate a worst pain of 1/10 to indicate significant improvement in pain and improvement overall with hip pain    Baseline  3/10; 06/02/2018: 3/10; 08/17/2018: 8/10    Time  6    Period  Weeks    Status  On-going            Plan - 09/07/18 1325    Clinical Impression Statement  Patient demonstrates no increase in pain throughout session. Continued to perform manual therapy  as she demonstrates less pain  with performance of manual therapy. Patient continues to demonstrates increased muscular fatigue with hip abduction exercises. Patient will benefit from further skilled therapy to return to prior level of function.    Personal Factors and Comorbidities  Age    Rehab Potential  Good    Clinical Impairments Affecting Rehab Potential  (+) Motivated, enjoys physical activity (-) Multiple comorbidities, hx of smoking, chronic pain    PT Frequency  2x / week    PT Duration  6 weeks    PT Treatment/Interventions  ADLs/Self Care Home Management;Cryotherapy;Electrical Stimulation;Moist Heat;Traction;Gait training;Stair training;Functional mobility training;Therapeutic activities;Therapeutic exercise;Neuromuscular re-education;Balance training;Patient/family education;Manual techniques;Energy conservation;Spinal Manipulations;Joint Manipulations;Taping    PT Next Visit Plan  Reasses HEP, Hip ER strength,     PT Home Exercise Plan  Walk 3/4 mile, side-lying hip abduction    Consulted and Agree with Plan of Care  Patient       Patient will benefit from skilled therapeutic intervention in order to improve the following deficits and impairments:  Improper body mechanics, Pain, Increased muscle spasms, Decreased activity tolerance, Decreased endurance, Decreased strength  Visit Diagnosis: 1. Pain in left hip   2. Muscle weakness (generalized)   3. Chronic hip pain, bilateral        Problem List Patient Active Problem List   Diagnosis Date Noted  . Aortic atherosclerosis ([place]) 01/19/2017  . Lymphocytic colitis 12/07/2016  . Noninfectious diarrhea   . Diarrhea of presumed infectious origin 06/26/2016  . Coronary artery disease involving native coronary artery of native heart without angina pectoris 07/22/2015  . Personal history of tobacco use, presenting hazards to health 06/26/2015  . Rectal polyp   . History of smoking 30 or more pack years 05/17/2015  . Personal  history of nicotine dependence 05/17/2015  . Constipation 02/13/2015  . Cramp in lower leg 02/12/2015  . Difficulty hearing 02/12/2015  . Acid reflux 02/12/2015  . B12 deficiency 02/12/2015  . Abnormal blood sugar 02/12/2015  . Hearing loss 02/12/2015  . Hypothyroidism 09/10/2014  . Insomnia 09/05/2014  . Other specified postprocedural states 04/18/2013  . S/P breast reconstruction, left 04/18/2013  . Personal history of malignant neoplasm of breast 06/20/2012  . History of abnormal Pap smear 06/20/2012  . Benign hypertension 07/01/2009  . Essential (primary) hypertension 07/01/2009  . Genital herpes 03/26/2009  . 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 Hypercholesterolemia without hypertriglyceridemia 02/21/2009  . Allergic rhinitis 02/21/2009    Blythe Stanford, PT DPT 09/07/2018, 1:51 PM  [facility] [address] [place], [address] Phone: [phone]   Fax:  [phone]  Name: Adriana Hicks MRN: [id number] Date of Birth: 11-08-1945

## 2018-09-12 ENCOUNTER — Ambulatory Visit: Payer: Federal, State, Local not specified - PPO

## 2018-09-12 ENCOUNTER — Other Ambulatory Visit: Payer: Self-pay

## 2018-09-12 DIAGNOSIS — M6281 Muscle weakness (generalized): Secondary | ICD-10-CM | POA: Diagnosis not present

## 2018-09-12 DIAGNOSIS — M25551 Pain in right hip: Secondary | ICD-10-CM

## 2018-09-12 DIAGNOSIS — M25552 Pain in left hip: Secondary | ICD-10-CM

## 2018-09-12 DIAGNOSIS — G8929 Other chronic pain: Secondary | ICD-10-CM

## 2018-09-12 NOTE — Therapy (Signed)
[facility] [address] [place], [address] Phone: [phone]   Fax:  [phone]  Physical Therapy Treatment  Patient Details  Name: Adriana Hicks MRN: [id number] Date of Birth: 1945-09-28 Referring Provider (PT): [name]   Encounter Date: 09/12/2018  PT End of Session - 09/12/18 1315    Visit Number  17    Number of Visits  21    Date for PT Re-Evaluation  09/14/18    PT Start Time  9450    PT Stop Time  1345    PT Time Calculation (min)  40 min    Activity Tolerance  Patient tolerated treatment well    Behavior During Therapy  [facility] for tasks assessed/performed       Past Medical History:  Diagnosis Date  . Abnormal Pap smear of cervix 3/15   ASCUS HPV not detected  . ADD (attention deficit disorder)   . ADD (attention deficit disorder)   . Anemia   . Arthritis    fingers  . Breast cancer ([place])    Left; Age 69  . Colitis   . Genital herpes 03/26/2009  . GERD (gastroesophageal reflux disease)   . History of chest pain    Related to reflux  . HSV-2 (herpes simplex virus 2) infection   . Hyperlipidemia   . Hypertension   . Hypothyroidism   . Personal history of chemotherapy   . Personal history of tobacco use, presenting hazards to health 06/26/2015  . S/P appendectomy 1981  . Seborrheic dermatitis   . Vertigo    x1 - approx 5 yrs ago    Past Surgical History:  Procedure Laterality Date  . APPENDECTOMY    . AUGMENTATION MAMMAPLASTY  1982  . AUGMENTATION MAMMAPLASTY Bilateral 3888   silicone gel  . BREAST IMPLANT EXCHANGE Bilateral 04/21/2016   Procedure: REMOVAL OF BREAST IMPLANTS WITH IMMEDIATE REPLACMENTOF BREAST IMPLANTS;  Surgeon: Youlanda Roys, MD;  Location: [facility];  Service: Plastics;  Laterality: Bilateral;  . BREAST SURGERY  2005   Breast Implant replaceddue to leaking  . BREAST SURGERY Left 2006   due to asymmetry  . CARDIAC CATHETERIZATION      2011 at [facility] - reflux related  . CERVICAL BIOPSY  W/ LOOP ELECTRODE EXCISION     unsure pathology   . CESAREAN SECTION  1985  . COLONOSCOPY WITH PROPOFOL N/A 06/17/2015   Procedure: COLONOSCOPY WITH PROPOFOL;  Surgeon: Lucilla Lame, MD;  Location: [facility];  Service: Endoscopy;  Laterality: N/A;  PT PREFERS EARLY AM  . COLONOSCOPY WITH PROPOFOL N/A 07/16/2016   Procedure: COLONOSCOPY WITH PROPOFOL;  Surgeon: Lucilla Lame, MD;  Location: [facility];  Service: Endoscopy;  Laterality: N/A;  requests early as possible  . COSMETIC SURGERY     eyelid surgery  . DILATION AND CURETTAGE OF UTERUS    . ENDOMETRIAL BIOPSY    . MASTECTOMY Left    new implant on the Right  . MASTOPEXY Right 04/21/2016   Procedure: MASTOPEXY;  Surgeon: Youlanda Roys, MD;  Location: [facility] [facility];  Service: Plastics;  Laterality: Right;  . POLYPECTOMY  06/17/2015   Procedure: POLYPECTOMY;  Surgeon: Lucilla Lame, MD;  Location: [facility];  Service: Endoscopy;;  . TONSILLECTOMY  child  . TUBAL LIGATION      There were no vitals filed for this visit.  Subjective Assessment - 09/12/18 1313    Subjective  Patient reports improvement in sleeping quality today versus previous sessions secondary to no increase in pain at night.    Limitations  Walking;Lifting    How long can you sit comfortably?  Unlimited    How long can you stand comfortably?  Unlimited    How long can you walk comfortably?  ~1 mile    Patient Stated Goals  Return to walking, no pain when sleeping    Currently in Pain?  No/denies    Pain Onset  More than a month ago       TREATMENT Therapeutic Exercise Hip leg swings abduction side to side - x 2 min Hip leg swings forward/backwards - x 55min Single leg stance - 30sec x 3 Single leg rotational twists in stands - x 20  Running man off of airex pad - x 20  Hip hikes off of six inch step - x 20  Hip abduction in sidelying - x20   Manual  therapy STM performed to the hip abductors in sidelying to decrease pain and spasms along the hip musculature. Utilized ART along the glute med to decrease increased pain and spasms. - performed manual therapy -- x 20 min    Performed manual therapy to decrease pain and spams along the hips. Performed hip exercises to improve pain/spasms and strength along the lateral hip musculature B.      PT Education - 09/12/18 1314    Education Details  form/technique with exercise    Person(s) Educated  Patient    Methods  Explanation;Demonstration    Comprehension  Verbalized understanding;Returned demonstration       PT Short Term Goals - 04/27/18 1507      PT SHORT TERM GOAL #1   Title  Pt will have 100% compliance with HEP to improve hip strength and walking tolerance    Baseline  04/27/2018: Side-lying hip abduction and wlkaing 3/4 mile every other day    Time  2    Period  Weeks    Status  New    Target Date  05/11/18        PT Long Term Goals - 08/17/18 1307      PT LONG TERM GOAL #1   Title  Pt will have a decrease of at least 3 points on NPRS when sleeping to demonstrate improvement in therapy.    Baseline  04/27/2018: 5/10 NPRS; 06/02/2018: 3/10; 08/17/2018: 8/10    Time  6    Period  Weeks    Status  On-going      PT LONG TERM GOAL #2   Title  Pt will be able to walk 2 miles with no pain to return to recreational activities with friends.    Baseline  04/27/2018: 5/10 NPRS when walking 1 mile; 2/10 with walking 1 mile; 08/17/2018: Able to ambulate 5 miles with 5/10     Time  6    Period  Weeks    Status  On-going      PT LONG TERM GOAL #3   Title  Patient will demosntrate a worst pain of 1/10 to indicate significant improvement in pain and improvement overall with hip pain    Baseline  3/10; 06/02/2018: 3/10; 08/17/2018: 8/10    Time  6    Period  Weeks    Status  On-going            Plan - 09/12/18 1334    Clinical Impression Statement  Since the patient has been  continuously improving, we  continued to focus on performing manual therapy to decrease musuclar spasms and decrease pain. Patient demonstrates continuous improvement in pain and is able to perform a greater amount of repetitions before onset of fatigue. Although patient is improving, she conitnues to demonstrate baseline pain at night and will benefit from further skilled therapy to return to prior level of function.    Personal Factors and Comorbidities  Age    Rehab Potential  Good    Clinical Impairments Affecting Rehab Potential  (+) Motivated, enjoys physical activity (-) Multiple comorbidities, hx of smoking, chronic pain    PT Frequency  2x / week    PT Duration  6 weeks    PT Treatment/Interventions  ADLs/Self Care Home Management;Cryotherapy;Electrical Stimulation;Moist Heat;Traction;Gait training;Stair training;Functional mobility training;Therapeutic activities;Therapeutic exercise;Neuromuscular re-education;Balance training;Patient/family education;Manual techniques;Energy conservation;Spinal Manipulations;Joint Manipulations;Taping    PT Next Visit Plan  Reasses HEP, Hip ER strength,     PT Home Exercise Plan  Walk 3/4 mile, side-lying hip abduction    Consulted and Agree with Plan of Care  Patient       Patient will benefit from skilled therapeutic intervention in order to improve the following deficits and impairments:  Improper body mechanics, Pain, Increased muscle spasms, Decreased activity tolerance, Decreased endurance, Decreased strength  Visit Diagnosis: 1. Pain in left hip   2. Muscle weakness (generalized)   3. Chronic hip pain, bilateral        Problem List Patient Active Problem List   Diagnosis Date Noted  . Aortic atherosclerosis ([place]) 01/19/2017  . Lymphocytic colitis 12/07/2016  . Noninfectious diarrhea   . Diarrhea of presumed infectious origin 06/26/2016  . Coronary artery disease involving native coronary artery of native heart without angina pectoris  07/22/2015  . Personal history of tobacco use, presenting hazards to health 06/26/2015  . Rectal polyp   . History of smoking 30 or more pack years 05/17/2015  . Personal history of nicotine dependence 05/17/2015  . Constipation 02/13/2015  . Cramp in lower leg 02/12/2015  . Difficulty hearing 02/12/2015  . Acid reflux 02/12/2015  . B12 deficiency 02/12/2015  . Abnormal blood sugar 02/12/2015  . Hearing loss 02/12/2015  . Hypothyroidism 09/10/2014  . Insomnia 09/05/2014  . Other specified postprocedural states 04/18/2013  . S/P breast reconstruction, left 04/18/2013  . Personal history of malignant neoplasm of breast 06/20/2012  . History of abnormal Pap smear 06/20/2012  . Benign hypertension 07/01/2009  . Essential (primary) hypertension 07/01/2009  . Genital herpes 03/26/2009  . 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 Hypercholesterolemia without hypertriglyceridemia 02/21/2009  . Allergic rhinitis 02/21/2009    Blythe Stanford 09/12/2018, 1:52 PM  [facility] [address] [place], [address] Phone: [phone]   Fax:  [phone]  Name: Adriana Hicks MRN: [id number] Date of Birth: 1945/12/30

## 2018-09-13 ENCOUNTER — Other Ambulatory Visit: Payer: Self-pay

## 2018-09-13 ENCOUNTER — Other Ambulatory Visit (HOSPITAL_COMMUNITY)
Admission: RE | Admit: 2018-09-13 | Discharge: 2018-09-13 | Disposition: A | Discharge status: Home / Self Care | Payer: Federal, State, Local not specified - PPO | Source: Ambulatory Visit | Attending: Certified Nurse Midwife | Admitting: Certified Nurse Midwife

## 2018-09-13 ENCOUNTER — Ambulatory Visit (INDEPENDENT_AMBULATORY_CARE_PROVIDER_SITE_OTHER): Payer: Federal, State, Local not specified - PPO | Admitting: Certified Nurse Midwife

## 2018-09-13 ENCOUNTER — Encounter: Payer: Self-pay | Admitting: Certified Nurse Midwife

## 2018-09-13 VITALS — BP 120/64 | HR 70 | Resp 16 | Ht 62.5 in | Wt 144.0 lb

## 2018-09-13 DIAGNOSIS — A6009 Herpesviral infection of other urogenital tract: Secondary | ICD-10-CM | POA: Diagnosis not present

## 2018-09-13 DIAGNOSIS — N952 Postmenopausal atrophic vaginitis: Secondary | ICD-10-CM

## 2018-09-13 DIAGNOSIS — Z124 Encounter for screening for malignant neoplasm of cervix: Secondary | ICD-10-CM

## 2018-09-13 DIAGNOSIS — Z01419 Encounter for gynecological examination (general) (routine) without abnormal findings: Secondary | ICD-10-CM | POA: Diagnosis not present

## 2018-09-13 MED ORDER — ESTRADIOL 10 MCG VA TABS: ORAL_TABLET | VAGINAL | 3 refills | Status: DC

## 2018-09-13 MED ORDER — VALACYCLOVIR HCL 500 MG PO TABS: ORAL_TABLET | ORAL | 12 refills | Status: DC

## 2018-09-13 NOTE — Progress Notes (Signed)
73 y.o. G5P1001 Married  Caucasian Fe here for annual exam. Post menopausal denies vaginal bleeding. Vaginal dryness better with Vagifem and coconut oil for dryness. Has had no HSV outbreak and takes Valtrex on the week ends only. Sees PCP for labs and hypertension, thyroid management and medications.  Had BMD and now has osteoporosis, declined Fosamax. Treating with calcium,vitamin D, exercise, PCP managing. No other health concerns today.  Patient's last menstrual period was 03/23/1994.          Sexually active: Yes.    The current method of family planning is tubal ligation.    Exercising: Yes.    physical therapy twice a week Smoker:  no  Review of Systems  Constitutional: Negative.   HENT: Negative.   Eyes: Negative.   Respiratory: Negative.   Cardiovascular: Negative.   Gastrointestinal: Negative.   Genitourinary: Negative.   Musculoskeletal: Negative.   Skin: Negative.   Neurological: Negative.   Endo/Heme/Allergies: Negative.   Psychiatric/Behavioral: Negative.     Health Maintenance: Pap:  08-18-16 ASCUS HPV HR neg, 08-19-17 neg History of Abnormal Pap: yes MMG:  01-26-18 unilateral rt mmg category b density birads 1:neg Self Breast exams: occ Colonoscopy:  2018 inflammatory changes only BMD:   2017 osteopenia, has done now, Osteoporosis TDaP:  2012 Shingles: 2019 Pneumonia: 2016 Hep C and HIV: both neg 2017 Labs: if needed   reports that she quit smoking about 14 years ago. Her smoking use included cigarettes. She has a 35.00 pack-year smoking history. She has never used smokeless tobacco. She reports current alcohol use of about 7.0 standard drinks of alcohol per week. She reports that she does not use drugs.  Past Medical History:  Diagnosis Date  . Abnormal Pap smear of cervix 3/15   ASCUS HPV not detected  . ADD (attention deficit disorder)   . ADD (attention deficit disorder)   . Anemia   . Arthritis    fingers  . Breast cancer ([place])    Left; Age 43  .  Colitis   . Genital herpes 03/26/2009  . GERD (gastroesophageal reflux disease)   . History of chest pain    Related to reflux  . HSV-2 (herpes simplex virus 2) infection   . Hyperlipidemia   . Hypertension   . Hypothyroidism   . Personal history of chemotherapy   . Personal history of tobacco use, presenting hazards to health 06/26/2015  . S/P appendectomy 1981  . Seborrheic dermatitis   . Vertigo    x1 - approx 5 yrs ago    Past Surgical History:  Procedure Laterality Date  . APPENDECTOMY    . AUGMENTATION MAMMAPLASTY  1982  . AUGMENTATION MAMMAPLASTY Bilateral 8127   silicone gel  . BREAST IMPLANT EXCHANGE Bilateral 04/21/2016   Procedure: REMOVAL OF BREAST IMPLANTS WITH IMMEDIATE REPLACMENTOF BREAST IMPLANTS;  Surgeon: Youlanda Roys, MD;  Location: [facility];  Service: Plastics;  Laterality: Bilateral;  . BREAST SURGERY  2005   Breast Implant replaceddue to leaking  . BREAST SURGERY Left 2006   due to asymmetry  . CARDIAC CATHETERIZATION     2011 at [facility] - reflux related  . CERVICAL BIOPSY  W/ LOOP ELECTRODE EXCISION     unsure pathology   . CESAREAN SECTION  1985  . COLONOSCOPY WITH PROPOFOL N/A 06/17/2015   Procedure: COLONOSCOPY WITH PROPOFOL;  Surgeon: Lucilla Lame, MD;  Location: [facility];  Service: Endoscopy;  Laterality: N/A;  PT PREFERS EARLY AM  . COLONOSCOPY  WITH PROPOFOL N/A 07/16/2016   Procedure: COLONOSCOPY WITH PROPOFOL;  Surgeon: Lucilla Lame, MD;  Location: [facility];  Service: Endoscopy;  Laterality: N/A;  requests early as possible  . COSMETIC SURGERY     eyelid surgery  . DILATION AND CURETTAGE OF UTERUS    . ENDOMETRIAL BIOPSY    . MASTECTOMY Left    new implant on the Right  . MASTOPEXY Right 04/21/2016   Procedure: MASTOPEXY;  Surgeon: Youlanda Roys, MD;  Location: [place];  Service: Plastics;  Laterality: Right;  . POLYPECTOMY  06/17/2015   Procedure: POLYPECTOMY;   Surgeon: Lucilla Lame, MD;  Location: [facility];  Service: Endoscopy;;  . TONSILLECTOMY  child  . TUBAL LIGATION      Current Outpatient Medications  Medication Sig Dispense Refill  . amphetamine-dextroamphetamine (ADDERALL) 20 MG tablet Take 20 mg by mouth daily.    . Ascorbic Acid (VITAMIN C PO) Take by mouth.    Marland Kitchen azelastine (ASTELIN) 0.1 % nasal spray Place into both nostrils 2 (two) times daily as needed for rhinitis. Use in each nostril as directed    . budesonide (ENTOCORT EC) 3 MG 24 hr capsule Take 3 capsules (9 mg total) by mouth every morning. (Patient taking differently: Take 9 mg by mouth as needed. ) 270 capsule 3  . cholecalciferol (VITAMIN D) 1000 UNITS tablet Take 1,000 Units by mouth daily.    . Clobetasol Propionate (CLODAN) 0.05 % shampoo Apply topically daily.    . cyanocobalamin (,VITAMIN B-12,) 1000 MCG/ML injection USE 1 ML PER INJECTION, IM WEEKLY 10 mL 3  . doxepin (SINEQUAN) 10 MG capsule TAKE 1 CAPSULE (10 MG TOTAL) BY MOUTH AT BEDTIME. 90 capsule 1  . ferrous sulfate 324 (65 FE) MG TBEC Take by mouth as needed. Reported on 06/17/2015    . losartan (COZAAR) 100 MG tablet Take 100 mg by mouth daily.    . Luliconazole (LUZU) 1 % CREA Apply topically.    . Melatonin 5 MG TABS Take by mouth at bedtime.     . Multiple Vitamins-Minerals (MULTIVITAMIN PO) Take by mouth.    . predniSONE (DELTASONE) 10 MG tablet TAKE 5 TAB X 2 DAYS, 4 TAB X 2 DAYS, 3 TAB X 2 DAYS, 2 TAB X 2 DAYS, 1 TAB X 2 DAYS  0  . Probiotic Product (PROBIOTIC PO) Take by mouth daily.    . S-Adenosylmethionine (SAM-E PO) Take by mouth daily.    . valACYclovir (VALTREX) 500 MG tablet Take one tablet twice daily at onset of outbreak x 3 days 45 tablet 12  . YUVAFEM 10 MCG TABS vaginal tablet PLACE 1 TABLET (10 MCG TOTAL) VAGINALLY 2 (TWO) TIMES A WEEK. 24 tablet 3   No current facility-administered medications for this visit.     Family History  Problem Relation Age of Onset  . Breast cancer  Mother 72  . Heart disease Father   . Skin cancer Brother   . Crohn's disease Brother     ROS:  Pertinent items are noted in HPI.  Otherwise, a comprehensive ROS was negative.  Exam:   LMP 03/23/1994    Ht Readings from Last 3 Encounters:  01/13/18 5' 2.5" (1.588 m)  08/19/17 5' 2.5" (1.588 m)  01/08/17 5\' 3"  (1.6 m)    General appearance: alert, cooperative and appears stated age Head: Normocephalic, without obvious abnormality, atraumatic Neck: no adenopathy, supple, symmetrical, trachea midline and thyroid normal to inspection and palpation Lungs: clear to  auscultation bilaterally Breasts: normal appearance, no masses or tenderness, No nipple retraction or dimpling, No nipple discharge or bleeding, No axillary or supraclavicular adenopathy Heart: regular rate and rhythm Abdomen: soft, non-tender; no masses,  no organomegaly Extremities: extremities normal, atraumatic, no cyanosis or edema Skin: Skin color, texture, turgor normal. No rashes or lesions Lymph nodes: Cervical, supraclavicular, and axillary nodes normal. No abnormal inguinal nodes palpated Neurologic: Grossly normal   Pelvic: External genitalia:  no lesions              Urethra:  normal appearing urethra with no masses, tenderness or lesions              Bartholin's and Skene's: normal                 Vagina: normal appearing vagina with normal color and discharge, no lesions              Cervix: LEEP appearance with very little cervical appearance, pap taken              Pap taken: Yes.   Bimanual Exam:  Uterus:  normal size, contour, position, consistency, mobility, non-tender and anteverted              Adnexa: normal adnexa and no mass, fullness, tenderness               Rectovaginal: Confirms               Anus:  normal sphincter tone, no lesions  Chaperone present: yes  A:  Well Woman with normal exam  Post menopausal no HRT  Atrophic vaginitis with good results with Yuvafem  History of LEEP with  cone biopsy  HSV occasional outbreak, needs Rx update  New diagnosis Osteoporosis with PCP management  Hypertension, Hypothyroid with PCP management  History of Breast cancer left 2006  P:   Reviewed health and wellness pertinent to exam  Aware of need to advise if vaginal bleeding  Discussed risk/benefits/warning signs with vaginal estrogen use. Desires RX  Rx Yuvafem see order with instructions  Rx Valtrex see order with instructions  Questions regarding calcium use discussed  Continue follow up with PCP as indicated  Pap smear: yes   counseled on breast self exam, mammography screening, feminine hygiene, osteoporosis, adequate intake of calcium and vitamin D, diet and exercise  return annually or prn  An After Visit Summary was printed and given to the patient.

## 2018-09-14 ENCOUNTER — Other Ambulatory Visit: Payer: Self-pay

## 2018-09-14 ENCOUNTER — Ambulatory Visit: Payer: Federal, State, Local not specified - PPO

## 2018-09-14 DIAGNOSIS — M6281 Muscle weakness (generalized): Secondary | ICD-10-CM

## 2018-09-14 DIAGNOSIS — M25552 Pain in left hip: Secondary | ICD-10-CM

## 2018-09-14 NOTE — Therapy (Signed)
[facility] [address], [place], [address] Phone: [phone]   Fax:  [phone]  Physical Therapy Treatment  Patient Details  Name: Adriana Hicks MRN: [id number] Date of Birth: 08-14-1945 Referring Provider (PT): [name]   Encounter Date: 09/14/2018  PT End of Session - 09/14/18 1356    Visit Number  18    Number of Visits  21    Date for PT Re-Evaluation  09/14/18    PT Start Time  8242    PT Stop Time  1347    PT Time Calculation (min)  42 min    Activity Tolerance  Patient tolerated treatment well    Behavior During Therapy  [facility] for tasks assessed/performed       Past Medical History:  Diagnosis Date  . Abnormal Pap smear of cervix 3/15   ASCUS HPV not detected  . ADD (attention deficit disorder)   . ADD (attention deficit disorder)   . Anemia   . Arthritis    fingers  . Arthritis   . Breast cancer ([place])    Left; Age 68  . Bursitis    both hips  . Colitis   . Genital herpes 03/26/2009  . GERD (gastroesophageal reflux disease)   . History of chest pain    Related to reflux  . HSV-2 (herpes simplex virus 2) infection   . Hyperlipidemia   . Hypertension   . Hypothyroidism   . Osteopenia   . Personal history of chemotherapy   . Personal history of tobacco use, presenting hazards to health 06/26/2015  . S/P appendectomy 1981  . Seborrheic dermatitis   . Vertigo    x1 - approx 5 yrs ago    Past Surgical History:  Procedure Laterality Date  . APPENDECTOMY    . AUGMENTATION MAMMAPLASTY  1982  . AUGMENTATION MAMMAPLASTY Bilateral 3536   silicone gel  . BREAST IMPLANT EXCHANGE Bilateral 04/21/2016   Procedure: REMOVAL OF BREAST IMPLANTS WITH IMMEDIATE REPLACMENTOF BREAST IMPLANTS;  Surgeon: Youlanda Roys, MD;  Location: [place];  Service: Plastics;  Laterality: Bilateral;  . BREAST SURGERY  2005   Breast Implant replaceddue to leaking  . BREAST SURGERY  Left 2006   due to asymmetry  . CARDIAC CATHETERIZATION     2011 at [facility] - reflux related  . CERVICAL BIOPSY  W/ LOOP ELECTRODE EXCISION     unsure pathology   . CESAREAN SECTION  1985  . COLONOSCOPY WITH PROPOFOL N/A 06/17/2015   Procedure: COLONOSCOPY WITH PROPOFOL;  Surgeon: Lucilla Lame, MD;  Location: [place];  Service: Endoscopy;  Laterality: N/A;  PT PREFERS EARLY AM  . COLONOSCOPY WITH PROPOFOL N/A 07/16/2016   Procedure: COLONOSCOPY WITH PROPOFOL;  Surgeon: Lucilla Lame, MD;  Location: [place];  Service: Endoscopy;  Laterality: N/A;  requests early as possible  . COSMETIC SURGERY     eyelid surgery  . DILATION AND CURETTAGE OF UTERUS    . ENDOMETRIAL BIOPSY    . eye lid surgery    . MASTECTOMY Left    new implant on the Right  . MASTOPEXY Right 04/21/2016   Procedure: MASTOPEXY;  Surgeon: Youlanda Roys, MD;  Location: [place];  Service: Plastics;  Laterality: Right;  . POLYPECTOMY  06/17/2015   Procedure: POLYPECTOMY;  Surgeon: Lucilla Lame, MD;  Location: [place];  Service: Endoscopy;;  . TONSILLECTOMY  child  . TUBAL LIGATION  There were no vitals filed for this visit.  Subjective Assessment - 09/14/18 1328    Subjective  Patient reports she had increased pain last night for the first time in a week she attributes this to not sleeping well the night before.    Limitations  Walking;Lifting    How long can you sit comfortably?  Unlimited    How long can you stand comfortably?  Unlimited    How long can you walk comfortably?  ~1 mile    Patient Stated Goals  Return to walking, no pain when sleeping    Pain Onset  More than a month ago          TREATMENT Therapeutic Exercise Hip abduction in sidelying - x20 Single leg squats in standing - x 20  Hip abduction in standing with GTB - x 20  Single leg deadlift opposite arm, opposite leg - x 20 with UE support Self mobilization with LAX ball - x 20   Single leg squat with UE support - x 20  Hip hikes off of six inch step - x 20    Manual therapy STM performed to the hip abductors in sidelying to decrease pain and spasms along the hip musculature. Utilized ART along the glute med to decrease increased pain and spasms. - performed manual therapy -- x 20 min    Performed manual therapy to decrease pain and spams along the hips. Performed hip exercises to improve pain/spasms and strength along the lateral hip musculature B.    PT Education - 09/14/18 1355    Education Details  form/technique with exercise; hip abduction in standing    Person(s) Educated  Patient    Methods  Explanation;Demonstration    Comprehension  Verbalized understanding;Returned demonstration       PT Short Term Goals - 04/27/18 1507      PT SHORT TERM GOAL #1   Title  Pt will have 100% compliance with HEP to improve hip strength and walking tolerance    Baseline  04/27/2018: Side-lying hip abduction and wlkaing 3/4 mile every other day    Time  2    Period  Weeks    Status  New    Target Date  05/11/18        PT Long Term Goals - 08/17/18 1307      PT LONG TERM GOAL #1   Title  Pt will have a decrease of at least 3 points on NPRS when sleeping to demonstrate improvement in therapy.    Baseline  04/27/2018: 5/10 NPRS; 06/02/2018: 3/10; 08/17/2018: 8/10    Time  6    Period  Weeks    Status  On-going      PT LONG TERM GOAL #2   Title  Pt will be able to walk 2 miles with no pain to return to recreational activities with friends.    Baseline  04/27/2018: 5/10 NPRS when walking 1 mile; 2/10 with walking 1 mile; 08/17/2018: Able to ambulate 5 miles with 5/10     Time  6    Period  Weeks    Status  On-going      PT LONG TERM GOAL #3   Title  Patient will demosntrate a worst pain of 1/10 to indicate significant improvement in pain and improvement overall with hip pain    Baseline  3/10; 06/02/2018: 3/10; 08/17/2018: 8/10    Time  6    Period  Weeks    Status   On-going  Plan - 09/14/18 1356    Clinical Impression Statement  Patient demonstrates no increase in pain during today's session with performance of exercises . However demosntrates increased pain with performing manual therapy over the affected area indicating increased guarding. Educated patient to perform exercises right before bed to decrease pain and on self mobilization of soft tissue with a LAX ball. Patient will benefit from further skilled therapy to return to prior level of function.    Personal Factors and Comorbidities  Age    Rehab Potential  Good    Clinical Impairments Affecting Rehab Potential  (+) Motivated, enjoys physical activity (-) Multiple comorbidities, hx of smoking, chronic pain    PT Frequency  2x / week    PT Duration  6 weeks    PT Treatment/Interventions  ADLs/Self Care Home Management;Cryotherapy;Electrical Stimulation;Moist Heat;Traction;Gait training;Stair training;Functional mobility training;Therapeutic activities;Therapeutic exercise;Neuromuscular re-education;Balance training;Patient/family education;Manual techniques;Energy conservation;Spinal Manipulations;Joint Manipulations;Taping    PT Next Visit Plan  Reasses HEP, Hip ER strength,     PT Home Exercise Plan  Walk 3/4 mile, side-lying hip abduction    Consulted and Agree with Plan of Care  Patient       Patient will benefit from skilled therapeutic intervention in order to improve the following deficits and impairments:  Improper body mechanics, Pain, Increased muscle spasms, Decreased activity tolerance, Decreased endurance, Decreased strength  Visit Diagnosis: 1. Pain in left hip   2. Muscle weakness (generalized)        Problem List Patient Active Problem List   Diagnosis Date Noted  . Sprain and strain of hip and thigh 05/27/2018  . Age-related osteoporosis without current pathological fracture 03/03/2018  . Aortic atherosclerosis ([place]) 01/19/2017  . Lymphocytic colitis  12/07/2016  . Noninfectious diarrhea   . Diarrhea of presumed infectious origin 06/26/2016  . Coronary artery disease involving native coronary artery of native heart without angina pectoris 07/22/2015  . Personal history of tobacco use, presenting hazards to health 06/26/2015  . Rectal polyp   . History of smoking 30 or more pack years 05/17/2015  . Personal history of nicotine dependence 05/17/2015  . Constipation 02/13/2015  . Cramp in lower leg 02/12/2015  . Difficulty hearing 02/12/2015  . Acid reflux 02/12/2015  . B12 deficiency 02/12/2015  . Abnormal blood sugar 02/12/2015  . Hearing loss 02/12/2015  . Hypothyroidism 09/10/2014  . Insomnia 09/05/2014  . Other specified postprocedural states 04/18/2013  . S/P breast reconstruction, left 04/18/2013  . Personal history of malignant neoplasm of breast 06/20/2012  . History of abnormal Pap smear 06/20/2012  . Benign hypertension 07/01/2009  . Essential (primary) hypertension 07/01/2009  . Genital herpes 03/26/2009  . 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 Hypercholesterolemia without hypertriglyceridemia 02/21/2009  . Allergic rhinitis 02/21/2009    Blythe Stanford, PT DPT 09/14/2018, 2:40 PM  [facility] [address], [place], [address] Phone: [phone]   Fax:  [phone]  Name: Adriana Hicks MRN: [id number] Date of Birth: [birth year]

## 2018-09-16 LAB — CYTOLOGY - PAP
Diagnosis: NEGATIVE
HPV: NOT DETECTED

## 2018-09-19 ENCOUNTER — Ambulatory Visit: Payer: Federal, State, Local not specified - PPO

## 2018-09-19 ENCOUNTER — Other Ambulatory Visit: Payer: Self-pay

## 2018-09-19 DIAGNOSIS — M6281 Muscle weakness (generalized): Secondary | ICD-10-CM | POA: Diagnosis not present

## 2018-09-19 DIAGNOSIS — M25552 Pain in left hip: Secondary | ICD-10-CM

## 2018-09-19 DIAGNOSIS — G8929 Other chronic pain: Secondary | ICD-10-CM

## 2018-09-19 NOTE — Therapy (Signed)
[facility] [address] [place], [address] Phone: [phone]   Fax:  [phone]  Physical Therapy Treatment  Patient Details  Name: Adriana Hicks MRN: [id number] Date of Birth: 1945-12-08 Referring Provider (PT): [name]   Encounter Date: 09/19/2018  PT End of Session - 09/19/18 1318    Visit Number  19    Number of Visits  29    Date for PT Re-Evaluation  10/17/18    PT Start Time  1300    PT Stop Time  1345    PT Time Calculation (min)  45 min    Activity Tolerance  Patient tolerated treatment well    Behavior During Therapy  [facility] for tasks assessed/performed       Past Medical History:  Diagnosis Date  . Abnormal Pap smear of cervix 3/15   ASCUS HPV not detected  . ADD (attention deficit disorder)   . ADD (attention deficit disorder)   . Anemia   . Arthritis    fingers  . Arthritis   . Breast cancer ([place])    Left; Age 73  . Bursitis    both hips  . Colitis   . Genital herpes 03/26/2009  . GERD (gastroesophageal reflux disease)   . History of chest pain    Related to reflux  . HSV-2 (herpes simplex virus 2) infection   . Hyperlipidemia   . Hypertension   . Hypothyroidism   . Osteopenia   . Personal history of chemotherapy   . Personal history of tobacco use, presenting hazards to health 06/26/2015  . S/P appendectomy 1981  . Seborrheic dermatitis   . Vertigo    x1 - approx 5 yrs ago    Past Surgical History:  Procedure Laterality Date  . APPENDECTOMY    . AUGMENTATION MAMMAPLASTY  1982  . AUGMENTATION MAMMAPLASTY Bilateral 2836   silicone gel  . BREAST IMPLANT EXCHANGE Bilateral 04/21/2016   Procedure: REMOVAL OF BREAST IMPLANTS WITH IMMEDIATE REPLACMENTOF BREAST IMPLANTS;  Surgeon: Youlanda Roys, MD;  Location: [place];  Service: Plastics;  Laterality: Bilateral;  . BREAST SURGERY  2005   Breast Implant replaceddue to leaking  . BREAST SURGERY  Left 2006   due to asymmetry  . CARDIAC CATHETERIZATION     2011 at [facility] - reflux related  . CERVICAL BIOPSY  W/ LOOP ELECTRODE EXCISION     unsure pathology   . CESAREAN SECTION  1985  . COLONOSCOPY WITH PROPOFOL N/A 06/17/2015   Procedure: COLONOSCOPY WITH PROPOFOL;  Surgeon: Lucilla Lame, MD;  Location: [place];  Service: Endoscopy;  Laterality: N/A;  PT PREFERS EARLY AM  . COLONOSCOPY WITH PROPOFOL N/A 07/16/2016   Procedure: COLONOSCOPY WITH PROPOFOL;  Surgeon: Lucilla Lame, MD;  Location: [place];  Service: Endoscopy;  Laterality: N/A;  requests early as possible  . COSMETIC SURGERY     eyelid surgery  . DILATION AND CURETTAGE OF UTERUS    . ENDOMETRIAL BIOPSY    . eye lid surgery    . MASTECTOMY Left    new implant on the Right  . MASTOPEXY Right 04/21/2016   Procedure: MASTOPEXY;  Surgeon: Youlanda Roys, MD;  Location: [place];  Service: Plastics;  Laterality: Right;  . POLYPECTOMY  06/17/2015   Procedure: POLYPECTOMY;  Surgeon: Lucilla Lame, MD;  Location: [place];  Service: Endoscopy;;  . TONSILLECTOMY  child  . TUBAL LIGATION  There were no vitals filed for this visit.  Subjective Assessment - 09/19/18 1303    Subjective  Patient reports she has had decreased pain since the previous session. Patient states she has been sleeping better since the previous session.    Limitations  Walking;Lifting    How long can you sit comfortably?  Unlimited    How long can you stand comfortably?  Unlimited    How long can you walk comfortably?  ~1 mile    Patient Stated Goals  Return to walking, no pain when sleeping    Currently in Pain?  No/denies    Pain Onset  More than a month ago           TREATMENT Therapeutic Exercise Hip hikes off of six inch step - x 20  Leg swings forward/backward in standing - x 20 Leg swings laterally in standing - x 20  Lateral twists in standing - x 20 Heel taps off of 6"  step - x 20 B  Hip abduction in standing - x20 with RTB around ankles Hip extension in standing - x20 with RTB around ankles   Manual Therapy Performed manual therapy to decrease pain and spams along the hips. Performed hip exercises to improve pain/spasms and strength along the lateral hip musculature B.    Performed manual therapy to further decrease pain and improve hip strengthening and muscular coordination.     PT Education - 09/19/18 1317    Education Details  form/technique with exercise    Person(s) Educated  Patient    Methods  Explanation;Demonstration    Comprehension  Verbalized understanding;Returned demonstration       PT Short Term Goals - 09/19/18 1312      PT SHORT TERM GOAL #1   Title  Pt will have 100% compliance with HEP to improve hip strength and walking tolerance    Baseline  04/27/2018: Side-lying hip abduction and wlkaing 3/4 mile every other day; 09/19/18: independent with HEP    Time  2    Period  Weeks    Status  Achieved    Target Date  05/11/18        PT Long Term Goals - 09/19/18 1311      PT LONG TERM GOAL #1   Title  Pt will have a decrease of at least 3 points on NPRS when sleeping to demonstrate improvement in therapy.    Baseline  04/27/2018: 5/10 NPRS; 06/02/2018: 3/10; 08/17/2018: 8/10; 09/19/18: 3/10    Time  6    Period  Weeks    Status  On-going      PT LONG TERM GOAL #2   Title  Pt will be able to walk 2 miles with no pain to return to recreational activities with friends.    Baseline  04/27/2018: 5/10 NPRS when walking 1 mile; 2/10 with walking 1 mile; 08/17/2018: Able to ambulate 5 miles with 5/10; able to amb without increase in pain    Time  6    Period  Weeks    Status  Achieved      PT LONG TERM GOAL #3   Title  Patient will demosntrate a worst pain of 1/10 to indicate significant improvement in pain and improvement overall with hip pain    Baseline  3/10; 06/02/2018: 3/10; 08/17/2018: 8/10; 09/19/18: 3/10    Time  6    Period   Weeks    Status  On-going  Plan - 09/19/18 1318    Clinical Impression Statement  Patient is making progress towards long term goals with ability to perform unlimited walking throughout the day without pain. Patient also demonstrates a worse pain of a 3/10 in her hip at night, this is improved compared to the previous progress note. Patient is improved overall but continues to have increased pain at night but is improving overall. Patient will benefit from furhter skilled therapy to return to prior level of function.    Personal Factors and Comorbidities  Age    Rehab Potential  Good    Clinical Impairments Affecting Rehab Potential  (+) Motivated, enjoys physical activity (-) Multiple comorbidities, hx of smoking, chronic pain    PT Frequency  2x / week    PT Duration  6 weeks    PT Treatment/Interventions  ADLs/Self Care Home Management;Cryotherapy;Electrical Stimulation;Moist Heat;Traction;Gait training;Stair training;Functional mobility training;Therapeutic activities;Therapeutic exercise;Neuromuscular re-education;Balance training;Patient/family education;Manual techniques;Energy conservation;Spinal Manipulations;Joint Manipulations;Taping    PT Next Visit Plan  Reasses HEP, Hip ER strength,     PT Home Exercise Plan  Walk 3/4 mile, side-lying hip abduction    Consulted and Agree with Plan of Care  Patient       Patient will benefit from skilled therapeutic intervention in order to improve the following deficits and impairments:  Improper body mechanics, Pain, Increased muscle spasms, Decreased activity tolerance, Decreased endurance, Decreased strength  Visit Diagnosis: 1. Pain in left hip   2. Muscle weakness (generalized)   3. Chronic hip pain, bilateral        Problem List Patient Active Problem List   Diagnosis Date Noted  . Sprain and strain of hip and thigh 05/27/2018  . Age-related osteoporosis without current pathological fracture 03/03/2018  . Aortic  atherosclerosis ([place]) 01/19/2017  . Lymphocytic colitis 12/07/2016  . Noninfectious diarrhea   . Diarrhea of presumed infectious origin 06/26/2016  . Coronary artery disease involving native coronary artery of native heart without angina pectoris 07/22/2015  . Personal history of tobacco use, presenting hazards to health 06/26/2015  . Rectal polyp   . History of smoking 30 or more pack years 05/17/2015  . Personal history of nicotine dependence 05/17/2015  . Constipation 02/13/2015  . Cramp in lower leg 02/12/2015  . Difficulty hearing 02/12/2015  . Acid reflux 02/12/2015  . B12 deficiency 02/12/2015  . Abnormal blood sugar 02/12/2015  . Hearing loss 02/12/2015  . Hypothyroidism 09/10/2014  . Insomnia 09/05/2014  . Other specified postprocedural states 04/18/2013  . S/P breast reconstruction, left 04/18/2013  . Personal history of malignant neoplasm of breast 06/20/2012  . History of abnormal Pap smear 06/20/2012  . Benign hypertension 07/01/2009  . Essential (primary) hypertension 07/01/2009  . Genital herpes 03/26/2009  . 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 Hypercholesterolemia without hypertriglyceridemia 02/21/2009  . Allergic rhinitis 02/21/2009    Blythe Stanford, PT DPT 09/19/2018, 1:47 PM  [facility] [address] [place], [address] Phone: [phone]   Fax:  [phone]  Name: Adriana Hicks MRN: [id number] Date of Birth: [birth year]

## 2018-09-21 ENCOUNTER — Other Ambulatory Visit: Payer: Self-pay

## 2018-09-21 ENCOUNTER — Ambulatory Visit: Payer: Federal, State, Local not specified - PPO | Attending: Student

## 2018-09-21 DIAGNOSIS — M25551 Pain in right hip: Secondary | ICD-10-CM | POA: Diagnosis present

## 2018-09-21 DIAGNOSIS — M25552 Pain in left hip: Secondary | ICD-10-CM

## 2018-09-21 DIAGNOSIS — M6281 Muscle weakness (generalized): Secondary | ICD-10-CM | POA: Diagnosis present

## 2018-09-21 DIAGNOSIS — G8929 Other chronic pain: Secondary | ICD-10-CM | POA: Diagnosis present

## 2018-09-21 NOTE — Therapy (Signed)
[facility] [address] [place], [address] Phone: [phone]   Fax:  [phone]  Physical Therapy Treatment  Patient Details  Name: Adriana Hicks MRN: [id number] Date of Birth: [birth year] Referring Provider (PT): [name]   Encounter Date: 09/21/2018  PT End of Session - 09/21/18 1509    Visit Number  20    Number of Visits  29    Date for PT Re-Evaluation  10/17/18    PT Start Time  1500    PT Stop Time  1545    PT Time Calculation (min)  45 min    Activity Tolerance  Patient tolerated treatment well    Behavior During Therapy  [facility] for tasks assessed/performed       Past Medical History:  Diagnosis Date  . Abnormal Pap smear of cervix 3/15   ASCUS HPV not detected  . ADD (attention deficit disorder)   . ADD (attention deficit disorder)   . Anemia   . Arthritis    fingers  . Arthritis   . Breast cancer ([place])    Left; Age 76  . Bursitis    both hips  . Colitis   . Genital herpes 03/26/2009  . GERD (gastroesophageal reflux disease)   . History of chest pain    Related to reflux  . HSV-2 (herpes simplex virus 2) infection   . Hyperlipidemia   . Hypertension   . Hypothyroidism   . Osteopenia   . Personal history of chemotherapy   . Personal history of tobacco use, presenting hazards to health 06/26/2015  . S/P appendectomy 1981  . Seborrheic dermatitis   . Vertigo    x1 - approx 5 yrs ago    Past Surgical History:  Procedure Laterality Date  . APPENDECTOMY    . AUGMENTATION MAMMAPLASTY  1982  . AUGMENTATION MAMMAPLASTY Bilateral 0539   silicone gel  . BREAST IMPLANT EXCHANGE Bilateral 04/21/2016   Procedure: REMOVAL OF BREAST IMPLANTS WITH IMMEDIATE REPLACMENTOF BREAST IMPLANTS;  Surgeon: Youlanda Roys, MD;  Location: [place];  Service: Plastics;  Laterality: Bilateral;  . BREAST SURGERY  2005   Breast Implant replaceddue to leaking  . BREAST SURGERY  Left 2006   due to asymmetry  . CARDIAC CATHETERIZATION     2011 at [facility] - reflux related  . CERVICAL BIOPSY  W/ LOOP ELECTRODE EXCISION     unsure pathology   . CESAREAN SECTION  1985  . COLONOSCOPY WITH PROPOFOL N/A 06/17/2015   Procedure: COLONOSCOPY WITH PROPOFOL;  Surgeon: Lucilla Lame, MD;  Location: [place];  Service: Endoscopy;  Laterality: N/A;  PT PREFERS EARLY AM  . COLONOSCOPY WITH PROPOFOL N/A 07/16/2016   Procedure: COLONOSCOPY WITH PROPOFOL;  Surgeon: Lucilla Lame, MD;  Location: [place];  Service: Endoscopy;  Laterality: N/A;  requests early as possible  . COSMETIC SURGERY     eyelid surgery  . DILATION AND CURETTAGE OF UTERUS    . ENDOMETRIAL BIOPSY    . eye lid surgery    . MASTECTOMY Left    new implant on the Right  . MASTOPEXY Right 04/21/2016   Procedure: MASTOPEXY;  Surgeon: Youlanda Roys, MD;  Location: [place];  Service: Plastics;  Laterality: Right;  . POLYPECTOMY  06/17/2015   Procedure: POLYPECTOMY;  Surgeon: Lucilla Lame, MD;  Location: [place];  Service: Endoscopy;;  . TONSILLECTOMY  child  . TUBAL LIGATION  There were no vitals filed for this visit.  Subjective Assessment - 09/21/18 1503    Subjective  Patient reports no major changes since the previous session. Pat[place] states increased pain on Monday at night however demonstrates improvement with exercise.    Limitations  Walking;Lifting    How long can you sit comfortably?  Unlimited    How long can you stand comfortably?  Unlimited    How long can you walk comfortably?  ~1 mile    Patient Stated Goals  Return to walking, no pain when sleeping    Currently in Pain?  No/denies    Pain Onset  More than a month ago       TREATMENT Therapeutic Exercise Hip hikes off of six inch step - x 20  Leg swings forward/backward in standing - x 20 Leg swings laterally in standing - x 20  Heel taps off of 6" step - x 20 B  Monster  walks with black band - x20  Hip abduction in standing - x20 with airex pad  Hip extension in standing - x20 with airex pad    Manual Therapy Performed manual therapy to decrease pain and spams along the hips. Performed hip exercises to improve pain/spasms and strength along the lateral hip musculature B.     Performed manual therapy to further decrease pain and improve hip strengthening and muscular coordination.   PT Education - 09/21/18 1508    Education Details  form/technique with exercise    Person(s) Educated  Patient    Methods  Explanation;Demonstration    Comprehension  Verbalized understanding;Returned demonstration       PT Short Term Goals - 09/19/18 1312      PT SHORT TERM GOAL #1   Title  Pt will have 100% compliance with HEP to improve hip strength and walking tolerance    Baseline  04/27/2018: Side-lying hip abduction and wlkaing 3/4 mile every other day; 09/19/18: independent with HEP    Time  2    Period  Weeks    Status  Achieved    Target Date  05/11/18        PT Long Term Goals - 09/19/18 1311      PT LONG TERM GOAL #1   Title  Pt will have a decrease of at least 3 points on NPRS when sleeping to demonstrate improvement in therapy.    Baseline  04/27/2018: 5/10 NPRS; 06/02/2018: 3/10; 08/17/2018: 8/10; 09/19/18: 3/10    Time  6    Period  Weeks    Status  On-going      PT LONG TERM GOAL #2   Title  Pt will be able to walk 2 miles with no pain to return to recreational activities with friends.    Baseline  04/27/2018: 5/10 NPRS when walking 1 mile; 2/10 with walking 1 mile; 08/17/2018: Able to ambulate 5 miles with 5/10; able to amb without increase in pain    Time  6    Period  Weeks    Status  Achieved      PT LONG TERM GOAL #3   Title  Patient will demosntrate a worst pain of 1/10 to indicate significant improvement in pain and improvement overall with hip pain    Baseline  3/10; 06/02/2018: 3/10; 08/17/2018: 8/10; 09/19/18: 3/10    Time  6    Period  Weeks     Status  On-going            Plan -  09/21/18 1510    Clinical Impression Statement  Patient demosntrates no increase in pain throughout the session. Patient demonstrates improvement with exercise and continued to perform manual therapy to decrease pain and spasms. Continued to address the lateral muscular of the hip with  exercises. Patient will benefit from further skilled therapy to return to prior level of function.    Personal Factors and Comorbidities  Age    Rehab Potential  Good    Clinical Impairments Affecting Rehab Potential  (+) Motivated, enjoys physical activity (-) Multiple comorbidities, hx of smoking, chronic pain    PT Frequency  2x / week    PT Duration  6 weeks    PT Treatment/Interventions  ADLs/Self Care Home Management;Cryotherapy;Electrical Stimulation;Moist Heat;Traction;Gait training;Stair training;Functional mobility training;Therapeutic activities;Therapeutic exercise;Neuromuscular re-education;Balance training;Patient/family education;Manual techniques;Energy conservation;Spinal Manipulations;Joint Manipulations;Taping    PT Next Visit Plan  Reasses HEP, Hip ER strength,     PT Home Exercise Plan  Walk 3/4 mile, side-lying hip abduction    Consulted and Agree with Plan of Care  Patient       Patient will benefit from skilled therapeutic intervention in order to improve the following deficits and impairments:  Improper body mechanics, Pain, Increased muscle spasms, Decreased activity tolerance, Decreased endurance, Decreased strength  Visit Diagnosis: 1. Pain in left hip   2. Muscle weakness (generalized)   3. Chronic hip pain, bilateral        Problem List Patient Active Problem List   Diagnosis Date Noted  . Sprain and strain of hip and thigh 05/27/2018  . Age-related osteoporosis without current pathological fracture 03/03/2018  . Aortic atherosclerosis ([place]) 01/19/2017  . Lymphocytic colitis 12/07/2016  . Noninfectious diarrhea   . Diarrhea  of presumed infectious origin 06/26/2016  . Coronary artery disease involving native coronary artery of native heart without angina pectoris 07/22/2015  . Personal history of tobacco use, presenting hazards to health 06/26/2015  . Rectal polyp   . History of smoking 30 or more pack years 05/17/2015  . Personal history of nicotine dependence 05/17/2015  . Constipation 02/13/2015  . Cramp in lower leg 02/12/2015  . Difficulty hearing 02/12/2015  . Acid reflux 02/12/2015  . B12 deficiency 02/12/2015  . Abnormal blood sugar 02/12/2015  . Hearing loss 02/12/2015  . Hypothyroidism 09/10/2014  . Insomnia 09/05/2014  . Other specified postprocedural states 04/18/2013  . S/P breast reconstruction, left 04/18/2013  . Personal history of malignant neoplasm of breast 06/20/2012  . History of abnormal Pap smear 06/20/2012  . Benign hypertension 07/01/2009  . Essential (primary) hypertension 07/01/2009  . Genital herpes 03/26/2009  . 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 Hypercholesterolemia without hypertriglyceridemia 02/21/2009  . Allergic rhinitis 02/21/2009    Blythe Stanford, PT DPT 09/21/2018, 3:55 PM  [facility] [address], [place], [address] Phone: [phone]   Fax:  [phone]  Name: Adriana Hicks MRN: [id number] Date of Birth: [birth year]

## 2018-09-26 ENCOUNTER — Other Ambulatory Visit: Payer: Self-pay

## 2018-09-26 ENCOUNTER — Ambulatory Visit: Payer: Federal, State, Local not specified - PPO

## 2018-09-26 DIAGNOSIS — M6281 Muscle weakness (generalized): Secondary | ICD-10-CM

## 2018-09-26 DIAGNOSIS — M25552 Pain in left hip: Secondary | ICD-10-CM | POA: Diagnosis not present

## 2018-09-26 NOTE — Therapy (Signed)
[facility] [address], [place], [address] Phone: [phone]   Fax:  [phone]  Physical Therapy Treatment  Patient Details  Name: TURKESSA OSTROM MRN: [id number] Date of Birth: 1946/01/16 Referring Provider (PT): [name]   Encounter Date: 09/26/2018  PT End of Session - 09/26/18 1320    Visit Number  21    Number of Visits  29    Date for PT Re-Evaluation  10/17/18    PT Start Time  1300    PT Stop Time  1345    PT Time Calculation (min)  45 min    Activity Tolerance  Patient tolerated treatment well    Behavior During Therapy  [facility] for tasks assessed/performed       Past Medical History:  Diagnosis Date  . Abnormal Pap smear of cervix 3/15   ASCUS HPV not detected  . ADD (attention deficit disorder)   . ADD (attention deficit disorder)   . Anemia   . Arthritis    fingers  . Arthritis   . Breast cancer ([place])    Left; Age 68  . Bursitis    both hips  . Colitis   . Genital herpes 03/26/2009  . GERD (gastroesophageal reflux disease)   . History of chest pain    Related to reflux  . HSV-2 (herpes simplex virus 2) infection   . Hyperlipidemia   . Hypertension   . Hypothyroidism   . Osteopenia   . Personal history of chemotherapy   . Personal history of tobacco use, presenting hazards to health 06/26/2015  . S/P appendectomy 1981  . Seborrheic dermatitis   . Vertigo    x1 - approx 5 yrs ago    Past Surgical History:  Procedure Laterality Date  . APPENDECTOMY    . AUGMENTATION MAMMAPLASTY  1982  . AUGMENTATION MAMMAPLASTY Bilateral 6962   silicone gel  . BREAST IMPLANT EXCHANGE Bilateral 04/21/2016   Procedure: REMOVAL OF BREAST IMPLANTS WITH IMMEDIATE REPLACMENTOF BREAST IMPLANTS;  Surgeon: Youlanda Roys, MD;  Location: [place];  Service: Plastics;  Laterality: Bilateral;  . BREAST SURGERY  2005   Breast Implant replaceddue to leaking  . BREAST SURGERY  Left 2006   due to asymmetry  . CARDIAC CATHETERIZATION     2011 at [facility] - reflux related  . CERVICAL BIOPSY  W/ LOOP ELECTRODE EXCISION     unsure pathology   . CESAREAN SECTION  1985  . COLONOSCOPY WITH PROPOFOL N/A 06/17/2015   Procedure: COLONOSCOPY WITH PROPOFOL;  Surgeon: Lucilla Lame, MD;  Location: [place];  Service: Endoscopy;  Laterality: N/A;  PT PREFERS EARLY AM  . COLONOSCOPY WITH PROPOFOL N/A 07/16/2016   Procedure: COLONOSCOPY WITH PROPOFOL;  Surgeon: Lucilla Lame, MD;  Location: [place];  Service: Endoscopy;  Laterality: N/A;  requests early as possible  . COSMETIC SURGERY     eyelid surgery  . DILATION AND CURETTAGE OF UTERUS    . ENDOMETRIAL BIOPSY    . eye lid surgery    . MASTECTOMY Left    new implant on the Right  . MASTOPEXY Right 04/21/2016   Procedure: MASTOPEXY;  Surgeon: Youlanda Roys, MD;  Location: [place];  Service: Plastics;  Laterality: Right;  . POLYPECTOMY  06/17/2015   Procedure: POLYPECTOMY;  Surgeon: Lucilla Lame, MD;  Location: [place];  Service: Endoscopy;;  . TONSILLECTOMY  child  . TUBAL LIGATION  There were no vitals filed for this visit.  Subjective Assessment - 09/26/18 1315    Subjective  Patient reports she continued to have pain along the hips B but states she has not been performing her HEP other than gardening.    Limitations  Walking;Lifting    How long can you sit comfortably?  Unlimited    How long can you stand comfortably?  Unlimited    How long can you walk comfortably?  ~1 mile    Patient Stated Goals  Return to walking, no pain when sleeping    Currently in Pain?  No/denies    Pain Onset  More than a month ago           TREATMENT Therapeutic Exercise Leg swings forward/backward in standing - x 20 Leg swings laterally in standing - x 20  Cone taps from a single leg stance tapping with hand into single leg deadlift position - x 20  Fire hydrants  in quadruped - x 10 B Single leg squats with UE support in doorway - x 20 B Hip circles in a figure '8' pattern - x 20  Running man with UE support - x 20  Single squat in standing with standing squat - x 10     Manual Therapy Performed manual therapy to decrease pain and spams along the hips. Performed hip exercises to improve pain/spasms and strength along the lateral hip musculature B.     Performed manual therapy to further decrease pain and improve hip strengthening and muscular coordination.   PT Education - 09/26/18 1319    Education Details  form/technique with exercise    Person(s) Educated  Patient    Methods  Explanation;Demonstration    Comprehension  Verbalized understanding;Returned demonstration       PT Short Term Goals - 09/19/18 1312      PT SHORT TERM GOAL #1   Title  Pt will have 100% compliance with HEP to improve hip strength and walking tolerance    Baseline  04/27/2018: Side-lying hip abduction and wlkaing 3/4 mile every other day; 09/19/18: independent with HEP    Time  2    Period  Weeks    Status  Achieved    Target Date  05/11/18        PT Long Term Goals - 09/19/18 1311      PT LONG TERM GOAL #1   Title  Pt will have a decrease of at least 3 points on NPRS when sleeping to demonstrate improvement in therapy.    Baseline  04/27/2018: 5/10 NPRS; 06/02/2018: 3/10; 08/17/2018: 8/10; 09/19/18: 3/10    Time  6    Period  Weeks    Status  On-going      PT LONG TERM GOAL #2   Title  Pt will be able to walk 2 miles with no pain to return to recreational activities with friends.    Baseline  04/27/2018: 5/10 NPRS when walking 1 mile; 2/10 with walking 1 mile; 08/17/2018: Able to ambulate 5 miles with 5/10; able to amb without increase in pain    Time  6    Period  Weeks    Status  Achieved      PT LONG TERM GOAL #3   Title  Patient will demosntrate a worst pain of 1/10 to indicate significant improvement in pain and improvement overall with hip pain     Baseline  3/10; 06/02/2018: 3/10; 08/17/2018: 8/10; 09/19/18: 3/10    Time  6    Period  Weeks    Status  On-going            Plan - 09/26/18 1328    Clinical Impression Statement  Continued to focus on improving hip strengthening and improvement of muscular endurance with exercise. Focused on importance of performing exercises at home as patient continues to have difficulty with performing sleeping at night comfortably. Patient will benefit from further skilled therapy focused on improving hip strength to decrease pain and spasms.    Personal Factors and Comorbidities  Age    Rehab Potential  Good    Clinical Impairments Affecting Rehab Potential  (+) Motivated, enjoys physical activity (-) Multiple comorbidities, hx of smoking, chronic pain    PT Frequency  2x / week    PT Duration  6 weeks    PT Treatment/Interventions  ADLs/Self Care Home Management;Cryotherapy;Electrical Stimulation;Moist Heat;Traction;Gait training;Stair training;Functional mobility training;Therapeutic activities;Therapeutic exercise;Neuromuscular re-education;Balance training;Patient/family education;Manual techniques;Energy conservation;Spinal Manipulations;Joint Manipulations;Taping    PT Next Visit Plan  Reasses HEP, Hip ER strength,     PT Home Exercise Plan  Walk 3/4 mile, side-lying hip abduction    Consulted and Agree with Plan of Care  Patient       Patient will benefit from skilled therapeutic intervention in order to improve the following deficits and impairments:  Improper body mechanics, Pain, Increased muscle spasms, Decreased activity tolerance, Decreased endurance, Decreased strength  Visit Diagnosis: 1. Pain in left hip   2. Muscle weakness (generalized)        Problem List Patient Active Problem List   Diagnosis Date Noted  . Sprain and strain of hip and thigh 05/27/2018  . Age-related osteoporosis without current pathological fracture 03/03/2018  . Aortic atherosclerosis ([place])  01/19/2017  . Lymphocytic colitis 12/07/2016  . Noninfectious diarrhea   . Diarrhea of presumed infectious origin 06/26/2016  . Coronary artery disease involving native coronary artery of native heart without angina pectoris 07/22/2015  . Personal history of tobacco use, presenting hazards to health 06/26/2015  . Rectal polyp   . History of smoking 30 or more pack years 05/17/2015  . Personal history of nicotine dependence 05/17/2015  . Constipation 02/13/2015  . Cramp in lower leg 02/12/2015  . Difficulty hearing 02/12/2015  . Acid reflux 02/12/2015  . B12 deficiency 02/12/2015  . Abnormal blood sugar 02/12/2015  . Hearing loss 02/12/2015  . Hypothyroidism 09/10/2014  . Insomnia 09/05/2014  . Other specified postprocedural states 04/18/2013  . S/P breast reconstruction, left 04/18/2013  . Personal history of malignant neoplasm of breast 06/20/2012  . History of abnormal Pap smear 06/20/2012  . Benign hypertension 07/01/2009  . Essential (primary) hypertension 07/01/2009  . Genital herpes 03/26/2009  . 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 Hypercholesterolemia without hypertriglyceridemia 02/21/2009  . Allergic rhinitis 02/21/2009    Blythe Stanford, PT DPT 09/26/2018, 3:14 PM  [facility] [address] [place], [address] Phone: [phone]   Fax:  [phone]  Name: LOISE ESGUERRA MRN: [id number] Date of Birth: 1945/10/01

## 2018-09-28 ENCOUNTER — Ambulatory Visit: Payer: Federal, State, Local not specified - PPO

## 2018-10-03 ENCOUNTER — Other Ambulatory Visit: Payer: Self-pay

## 2018-10-03 ENCOUNTER — Ambulatory Visit: Payer: Federal, State, Local not specified - PPO

## 2018-10-03 DIAGNOSIS — M25552 Pain in left hip: Secondary | ICD-10-CM | POA: Diagnosis not present

## 2018-10-03 DIAGNOSIS — M6281 Muscle weakness (generalized): Secondary | ICD-10-CM

## 2018-10-03 NOTE — Therapy (Signed)
[place] [facility] [address] [place], [address] Phone: [phone]   Fax:  [phone]  Physical Therapy Treatment  Patient Details  Name: Adriana Hicks MRN: [id number] Date of Birth: 1945-04-08 Referring Provider (PT): [name]   Encounter Date: 10/03/2018  PT End of Session - 10/03/18 1641    Visit Number  22    Number of Visits  29    Date for PT Re-Evaluation  10/17/18    PT Start Time  5462    PT Stop Time  7035    PT Time Calculation (min)  40 min    Activity Tolerance  Patient tolerated treatment well    Behavior During Therapy  [facility] for tasks assessed/performed       Past Medical History:  Diagnosis Date   Abnormal Pap smear of cervix 3/15   ASCUS HPV not detected   ADD (attention deficit disorder)    ADD (attention deficit disorder)    Anemia    Arthritis    fingers   Arthritis    Breast cancer ([place])    Left; Age 73   Bursitis    both hips   Colitis    Genital herpes 03/26/2009   GERD (gastroesophageal reflux disease)    History of chest pain    Related to reflux   HSV-2 (herpes simplex virus 2) infection    Hyperlipidemia    Hypertension    Hypothyroidism    Osteopenia    Personal history of chemotherapy    Personal history of tobacco use, presenting hazards to health 06/26/2015   S/P appendectomy 1981   Seborrheic dermatitis    Vertigo    x1 - approx 5 yrs ago    Past Surgical History:  Procedure Laterality Date   APPENDECTOMY     AUGMENTATION MAMMAPLASTY  1982   AUGMENTATION MAMMAPLASTY Bilateral 0093   silicone gel   BREAST IMPLANT EXCHANGE Bilateral 04/21/2016   Procedure: REMOVAL OF BREAST IMPLANTS WITH IMMEDIATE REPLACMENTOF BREAST IMPLANTS;  Surgeon: Youlanda Roys, MD;  Location: [place];  Service: Plastics;  Laterality: Bilateral;   BREAST SURGERY  2005   Breast Implant replaceddue to leaking   BREAST SURGERY  Left 2006   due to asymmetry   CARDIAC CATHETERIZATION     2011 at [facility] - reflux related   CERVICAL BIOPSY  W/ LOOP ELECTRODE EXCISION     unsure pathology    CESAREAN SECTION  1985   COLONOSCOPY WITH PROPOFOL N/A 06/17/2015   Procedure: COLONOSCOPY WITH PROPOFOL;  Surgeon: Lucilla Lame, MD;  Location: [place];  Service: Endoscopy;  Laterality: N/A;  PT PREFERS EARLY AM   COLONOSCOPY WITH PROPOFOL N/A 07/16/2016   Procedure: COLONOSCOPY WITH PROPOFOL;  Surgeon: Lucilla Lame, MD;  Location: [place];  Service: Endoscopy;  Laterality: N/A;  requests early as possible   COSMETIC SURGERY     eyelid surgery   DILATION AND CURETTAGE OF UTERUS     ENDOMETRIAL BIOPSY     eye lid surgery     MASTECTOMY Left    new implant on the Right   MASTOPEXY Right 04/21/2016   Procedure: MASTOPEXY;  Surgeon: Youlanda Roys, MD;  Location: [place];  Service: Plastics;  Laterality: Right;   POLYPECTOMY  06/17/2015   Procedure: POLYPECTOMY;  Surgeon: Lucilla Lame, MD;  Location: [place];  Service: Endoscopy;;   TONSILLECTOMY  child   TUBAL LIGATION  There were no vitals filed for this visit.  Subjective Assessment - 10/03/18 1638    Subjective  Pt reports having no current pain and being able to hike this weekend with no sx exacerbation. Pt does not report being able to perform her HEP regularly.    Pertinent History  Osteoporosis, hypertension, history of breast cancer    Limitations  Walking;Lifting    How long can you sit comfortably?  Unlimited    How long can you stand comfortably?  Unlimited    How long can you walk comfortably?  ~1 mile    Patient Stated Goals  Return to walking, no pain when sleeping    Currently in Pain?  No/denies    Pain Score  0-No pain    Pain Onset  More than a month ago    Multiple Pain Sites  No       Manual Therapy to address her soft tissue tightness and reduce pain.  Bilateral  soft tissue mobilizations to lateral hip/gluteal region. Distraction and compressive forces in sidelying, with concurrent active hip abduction/adduction movements x5.  Therapeutic Exercise to address and improve her strength, activity tolerance, and motor control.  Sidelying Hip Abductions x10 each side Standing SL Squats in doorway 2x20 SL Hip Hikes, bilaterally, off 6-inch step 2x15 SL Standing Hip Abductions with RTB, bilaterally, off 6-inch step, 2x15 SL Hip Circles x12 laterally, x15 posteriorly, each side 3-cone Bird Dips x5 each Walking Lunges 40 feet x3 Static Lunges x10  Pt tolerated treatment well, without exacerbation of sx.        [place] Adult PT Treatment/Exercise - 10/03/18 1639      Manual Therapy   Manual Therapy  Soft tissue mobilization    Soft tissue mobilization  To bilateral lateral hip (in sidelying, each side), distraction and compression forces used with concurrent active motions x5             PT Education - 10/03/18 1641    Education Details  Form/technique for exercises, HEP to be done regularly: Standing Hip Abduction with RTB 2x20 before bedtime.    Person(s) Educated  Patient    Methods  Explanation;Verbal cues    Comprehension  Verbalized understanding;Verbal cues required;Need further instruction       PT Short Term Goals - 09/19/18 1312      PT SHORT TERM GOAL #1   Title  Pt will have 100% compliance with HEP to improve hip strength and walking tolerance    Baseline  04/27/2018: Side-lying hip abduction and wlkaing 3/4 mile every other day; 09/19/18: independent with HEP    Time  2    Period  Weeks    Status  Achieved    Target Date  05/11/18        PT Long Term Goals - 09/19/18 1311      PT LONG TERM GOAL #1   Title  Pt will have a decrease of at least 3 points on NPRS when sleeping to demonstrate improvement in therapy.    Baseline  04/27/2018: 5/10 NPRS; 06/02/2018: 3/10; 08/17/2018: 8/10; 09/19/18: 3/10    Time  6    Period   Weeks    Status  On-going      PT LONG TERM GOAL #2   Title  Pt will be able to walk 2 miles with no pain to return to recreational activities with friends.    Baseline  04/27/2018: 5/10 NPRS when walking 1 mile; 2/10 with walking 1 mile; 08/17/2018: Able  to ambulate 5 miles with 5/10; able to amb without increase in pain    Time  6    Period  Weeks    Status  Achieved      PT LONG TERM GOAL #3   Title  Patient will demosntrate a worst pain of 1/10 to indicate significant improvement in pain and improvement overall with hip pain    Baseline  3/10; 06/02/2018: 3/10; 08/17/2018: 8/10; 09/19/18: 3/10    Time  6    Period  Weeks    Status  On-going            Plan - 10/03/18 1642    Clinical Impression Statement  Pt demonstrated significantly improved pain response (indicating 0/10), as well as increased tolerance to increased exercise intensity and volume. Pt responded well to manual therapy, as well as active exercise. Pt demonstrated increased knee valgus B during dynamic lunging and static single limb stance, which improved with static tactile cueing. Pt demonstrated impaired motor control bilaterally and eccentrically. Pt tolerated treatment well, indicating minimal fatigue at the hips and quads, bilaterally. Pt will benefit from skilled therapy treatment to progress her HEP and improve her strength and motor control bilaterally, which will allow her to return to prior level of function.    Personal Factors and Comorbidities  Age    Examination-Activity Limitations  Squat    Examination-Participation Restrictions  Community Activity    Stability/Clinical Decision Making  Stable/Uncomplicated    Clinical Decision Making  Low    Clinical Presentation due to:  Stable presentation, unchanging status    Rehab Potential  Good    Clinical Impairments Affecting Rehab Potential  (+) Motivated, enjoys physical activity (-) Multiple comorbidities, hx of smoking, chronic pain    PT Frequency  2x /  week    PT Duration  6 weeks    PT Treatment/Interventions  ADLs/Self Care Home Management;Cryotherapy;Electrical Stimulation;Moist Heat;Traction;Gait training;Stair training;Functional mobility training;Therapeutic activities;Therapeutic exercise;Neuromuscular re-education;Balance training;Patient/family education;Manual techniques;Energy conservation;Spinal Manipulations;Joint Manipulations;Taping    PT Next Visit Plan  Progress HEP, B LE strengthening    PT Home Exercise Plan  See education section.    Consulted and Agree with Plan of Care  Patient       Patient will benefit from skilled therapeutic intervention in order to improve the following deficits and impairments:  Improper body mechanics, Pain, Increased muscle spasms, Decreased activity tolerance, Decreased endurance, Decreased strength  Visit Diagnosis: 1. Pain in left hip   2. Muscle weakness (generalized)        Problem List Patient Active Problem List   Diagnosis Date Noted   Sprain and strain of hip and thigh 05/27/2018   Age-related osteoporosis without current pathological fracture 03/03/2018   Aortic atherosclerosis ([place]) 01/19/2017   Lymphocytic colitis 12/07/2016   Noninfectious diarrhea    Diarrhea of presumed infectious origin 06/26/2016   Coronary artery disease involving native coronary artery of native heart without angina pectoris 07/22/2015   Personal history of tobacco use, presenting hazards to health 06/26/2015   Rectal polyp    History of smoking 30 or more pack years 05/17/2015   Personal history of nicotine dependence 05/17/2015   Constipation 02/13/2015   Cramp in lower leg 02/12/2015   Difficulty hearing 02/12/2015   Acid reflux 02/12/2015   B12 deficiency 02/12/2015   Abnormal blood sugar 02/12/2015   Hearing loss 02/12/2015   Hypothyroidism 09/10/2014   Insomnia 09/05/2014   Other specified postprocedural states 04/18/2013   S/P breast reconstruction, left  04/18/2013   Personal history of malignant neoplasm of breast 06/20/2012   History of abnormal Pap smear 06/20/2012   Benign hypertension 07/01/2009   Essential (primary) hypertension 07/01/2009   Genital herpes 03/26/2009   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, [place], [address] Phone: [phone]   Fax:  [phone]  Name: LISAMARIE COKE MRN: [id number] Date of Birth: 1945/10/28

## 2018-10-05 ENCOUNTER — Other Ambulatory Visit: Payer: Self-pay

## 2018-10-05 ENCOUNTER — Ambulatory Visit: Payer: Federal, State, Local not specified - PPO

## 2018-10-05 DIAGNOSIS — M25552 Pain in left hip: Secondary | ICD-10-CM | POA: Diagnosis not present

## 2018-10-05 DIAGNOSIS — M6281 Muscle weakness (generalized): Secondary | ICD-10-CM

## 2018-10-05 NOTE — Therapy (Signed)
[place] [facility] [address], [place], [address] Phone: [phone]   Fax:  [phone]  Physical Therapy Treatment  Patient Details  Name: Adriana Hicks MRN: [id number] Date of Birth: [birth year] Referring Provider (PT): [name]   Encounter Date: 10/05/2018  PT End of Session - 10/05/18 1353    Visit Number  23    Number of Visits  29    Date for PT Re-Evaluation  10/17/18    PT Start Time  1300    PT Stop Time  1345    PT Time Calculation (min)  45 min    Activity Tolerance  Patient tolerated treatment well;No increased pain    Behavior During Therapy  [facility] for tasks assessed/performed       Past Medical History:  Diagnosis Date   Abnormal Pap smear of cervix 3/15   ASCUS HPV not detected   ADD (attention deficit disorder)    ADD (attention deficit disorder)    Anemia    Arthritis    fingers   Arthritis    Breast cancer ([place])    Left; Age 73   Bursitis    both hips   Colitis    Genital herpes 03/26/2009   GERD (gastroesophageal reflux disease)    History of chest pain    Related to reflux   HSV-2 (herpes simplex virus 2) infection    Hyperlipidemia    Hypertension    Hypothyroidism    Osteopenia    Personal history of chemotherapy    Personal history of tobacco use, presenting hazards to health 06/26/2015   S/P appendectomy 1981   Seborrheic dermatitis    Vertigo    x1 - approx 5 yrs ago    Past Surgical History:  Procedure Laterality Date   APPENDECTOMY     AUGMENTATION MAMMAPLASTY  1982   AUGMENTATION MAMMAPLASTY Bilateral 3875   silicone gel   BREAST IMPLANT EXCHANGE Bilateral 04/21/2016   Procedure: REMOVAL OF BREAST IMPLANTS WITH IMMEDIATE REPLACMENTOF BREAST IMPLANTS;  Surgeon: Youlanda Roys, MD;  Location: [place];  Service: Plastics;  Laterality: Bilateral;   BREAST SURGERY  2005   Breast Implant replaceddue to leaking    BREAST SURGERY Left 2006   due to asymmetry   CARDIAC CATHETERIZATION     2011 at [facility] - reflux related   CERVICAL BIOPSY  W/ LOOP ELECTRODE EXCISION     unsure pathology    CESAREAN SECTION  1985   COLONOSCOPY WITH PROPOFOL N/A 06/17/2015   Procedure: COLONOSCOPY WITH PROPOFOL;  Surgeon: Lucilla Lame, MD;  Location: [place];  Service: Endoscopy;  Laterality: N/A;  PT PREFERS EARLY AM   COLONOSCOPY WITH PROPOFOL N/A 07/16/2016   Procedure: COLONOSCOPY WITH PROPOFOL;  Surgeon: Lucilla Lame, MD;  Location: [place];  Service: Endoscopy;  Laterality: N/A;  requests early as possible   COSMETIC SURGERY     eyelid surgery   DILATION AND CURETTAGE OF UTERUS     ENDOMETRIAL BIOPSY     eye lid surgery     MASTECTOMY Left    new implant on the Right   MASTOPEXY Right 04/21/2016   Procedure: MASTOPEXY;  Surgeon: Youlanda Roys, MD;  Location: [place];  Service: Plastics;  Laterality: Right;   POLYPECTOMY  06/17/2015   Procedure: POLYPECTOMY;  Surgeon: Lucilla Lame, MD;  Location: [place];  Service: Endoscopy;;   TONSILLECTOMY  child   TUBAL  LIGATION      There were no vitals filed for this visit.  Subjective Assessment - 10/05/18 1323    Subjective  Pt reports 0/10 pain and being able to perform her HEP with little difficulty (monster walks with GTB). Reports no major changes since last visit.    Pertinent History  Osteoporosis, hypertension, history of breast cancer    Limitations  Walking;Lifting    How long can you sit comfortably?  Unlimited    How long can you stand comfortably?  Unlimited    How long can you walk comfortably?  ~1 mile    Patient Stated Goals  Return to walking, no pain when sleeping    Pain Onset  More than a month ago         [facility] Adult PT Treatment/Exercise - 10/05/18 1324      Manual Therapy   Manual Therapy  Soft tissue mobilization    Manual therapy comments  Sidelying ART  with concurrent hip flexion/adduction      Manual Therapy to address and improve pain response and muscular spasms/tightness. Soft tissue mobilizations in sidelying with concurrent ART of hip flexion/adduction moments. Pt reported improved pain response following treatment.  Therapeutic Exercise to address and improve her strength and motor control with functional mobility. Supine bilateral hip abduction with GTB 2x12,  5s hold Supine single leg bridge with abduction and GTB 2x12, 2-3s hold  Add knee extension and hip ER 2x12, 2-3s hold DL squat with valgus RTB x12 each leg SL squat with valgus RTB x12 each leg with UE support  At the end of the session, pt reported a 0/10 pain score. Performed exercises to improve glute med strength and improve coordination.     PT Education - 10/05/18 1351    Education Details  Form/technique with exercise, primarily with exaggerated hip abduction moment with incorporated resisted valgus at the knee. HEP modified to add supine SL bridges with RTB (2x12)    Person(s) Educated  Patient    Methods  Explanation;Demonstration;Tactile cues;Verbal cues    Comprehension  Verbalized understanding;Returned demonstration;Verbal cues required;Tactile cues required       PT Short Term Goals - 09/19/18 1312      PT SHORT TERM GOAL #1   Title  Pt will have 100% compliance with HEP to improve hip strength and walking tolerance    Baseline  04/27/2018: Side-lying hip abduction and wlkaing 3/4 mile every other day; 09/19/18: independent with HEP    Time  2    Period  Weeks    Status  Achieved    Target Date  05/11/18        PT Long Term Goals - 09/19/18 1311      PT LONG TERM GOAL #1   Title  Pt will have a decrease of at least 3 points on NPRS when sleeping to demonstrate improvement in therapy.    Baseline  04/27/2018: 5/10 NPRS; 06/02/2018: 3/10; 08/17/2018: 8/10; 09/19/18: 3/10    Time  6    Period  Weeks    Status  On-going      PT LONG TERM GOAL #2    Title  Pt will be able to walk 2 miles with no pain to return to recreational activities with friends.    Baseline  04/27/2018: 5/10 NPRS when walking 1 mile; 2/10 with walking 1 mile; 08/17/2018: Able to ambulate 5 miles with 5/10; able to amb without increase in pain    Time  6  Period  Weeks    Status  Achieved      PT LONG TERM GOAL #3   Title  Patient will demosntrate a worst pain of 1/10 to indicate significant improvement in pain and improvement overall with hip pain    Baseline  3/10; 06/02/2018: 3/10; 08/17/2018: 8/10; 09/19/18: 3/10    Time  6    Period  Weeks    Status  On-going            Plan - 10/05/18 1355    Clinical Impression Statement  Pt continues to demonstrate improved pain response and tolerance to increased exercise loads and intensity. Todays session focused on implementation of motor sequencing to improve her hip abduction in weight-bearing and various positions. Pt responded well to targeted manual therapy interventions prior to active intervention. Pt required intermittent verbal and tactile cueing for appropriate technique and reported a consistent low-moderate difficulty with exercises throughout the session. Pt demonstrated increased difficulty with single leg squatting with appropriate form. At the end of session, pt reported a similar 0/10 pain, compared to the start of the session. Pt will continue to benefit from skilled therapy treatment to address and improve her strength and motor control, as well to continue to reduce her overall pain, which will allow her to return to prior level of function.    Personal Factors and Comorbidities  Age    Examination-Activity Limitations  Squat    Examination-Participation Restrictions  Community Activity    Stability/Clinical Decision Making  Stable/Uncomplicated    Clinical Decision Making  Low    Clinical Presentation due to:  Stable/unchanging presentation    Rehab Potential  Good    Clinical Impairments Affecting  Rehab Potential  (+) Motivated, enjoys physical activity (-) Multiple comorbidities, hx of smoking, chronic pain    PT Frequency  2x / week    PT Duration  6 weeks    PT Treatment/Interventions  ADLs/Self Care Home Management;Cryotherapy;Electrical Stimulation;Moist Heat;Traction;Gait training;Stair training;Functional mobility training;Therapeutic activities;Therapeutic exercise;Neuromuscular re-education;Balance training;Patient/family education;Manual techniques;Energy conservation;Spinal Manipulations;Joint Manipulations;Taping    PT Next Visit Plan  Progress HEP, B LE strengthening/motor control    PT Home Exercise Plan  See education section.    Consulted and Agree with Plan of Care  Patient       Patient will benefit from skilled therapeutic intervention in order to improve the following deficits and impairments:  Improper body mechanics, Pain, Increased muscle spasms, Decreased activity tolerance, Decreased endurance, Decreased strength  Visit Diagnosis: 1. Pain in left hip   2. Muscle weakness (generalized)        Problem List Patient Active Problem List   Diagnosis Date Noted   Sprain and strain of hip and thigh 05/27/2018   Age-related osteoporosis without current pathological fracture 03/03/2018   Aortic atherosclerosis ([place]) 01/19/2017   Lymphocytic colitis 12/07/2016   Noninfectious diarrhea    Diarrhea of presumed infectious origin 06/26/2016   Coronary artery disease involving native coronary artery of native heart without angina pectoris 07/22/2015   Personal history of tobacco use, presenting hazards to health 06/26/2015   Rectal polyp    History of smoking 30 or more pack years 05/17/2015   Personal history of nicotine dependence 05/17/2015   Constipation 02/13/2015   Cramp in lower leg 02/12/2015   Difficulty hearing 02/12/2015   Acid reflux 02/12/2015   B12 deficiency 02/12/2015   Abnormal blood sugar 02/12/2015   Hearing loss 02/12/2015    Hypothyroidism 09/10/2014   Insomnia 09/05/2014   Other  specified postprocedural states 04/18/2013   S/P breast reconstruction, left 04/18/2013   Personal history of malignant neoplasm of breast 06/20/2012   History of abnormal Pap smear 06/20/2012   Benign hypertension 07/01/2009   Essential (primary) hypertension 07/01/2009   Genital herpes 03/26/2009   Herpesviral infection of urogenital system 03/26/2009   ADD (attention deficit hyperactivity disorder, 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, [place], [address] Phone: [phone]   Fax:  [phone]  Name: TANESSA TIDD MRN: [id number] Date of Birth: 25-Oct-1945

## 2018-10-10 ENCOUNTER — Ambulatory Visit: Payer: Federal, State, Local not specified - PPO

## 2018-10-10 DIAGNOSIS — M6281 Muscle weakness (generalized): Secondary | ICD-10-CM

## 2018-10-10 DIAGNOSIS — M25551 Pain in right hip: Secondary | ICD-10-CM

## 2018-10-10 DIAGNOSIS — M25552 Pain in left hip: Secondary | ICD-10-CM | POA: Diagnosis not present

## 2018-10-10 DIAGNOSIS — G8929 Other chronic pain: Secondary | ICD-10-CM

## 2018-10-10 NOTE — Therapy (Signed)
[facility] [address], [place], [address] Phone: [phone]   Fax:  [phone]  Physical Therapy Treatment  Patient Details  Name: Adriana Hicks MRN: [id number] Date of Birth: 07-Sep-1945 Referring Provider (PT): [name]   Encounter Date: 10/10/2018  PT End of Session - 10/10/18 1353    Visit Number  24    Number of Visits  29    Date for PT Re-Evaluation  10/17/18    PT Start Time  1300    PT Stop Time  1345    PT Time Calculation (min)  45 min    Activity Tolerance  Patient tolerated treatment well;No increased pain    Behavior During Therapy  [facility] for tasks assessed/performed       Past Medical History:  Diagnosis Date   Abnormal Pap smear of cervix 3/15   ASCUS HPV not detected   ADD (attention deficit disorder)    ADD (attention deficit disorder)    Anemia    Arthritis    fingers   Arthritis    Breast cancer ([place])    Left; Age 61   Bursitis    both hips   Colitis    Genital herpes 03/26/2009   GERD (gastroesophageal reflux disease)    History of chest pain    Related to reflux   HSV-2 (herpes simplex virus 2) infection    Hyperlipidemia    Hypertension    Hypothyroidism    Osteopenia    Personal history of chemotherapy    Personal history of tobacco use, presenting hazards to health 06/26/2015   S/P appendectomy 1981   Seborrheic dermatitis    Vertigo    x1 - approx 5 yrs ago    Past Surgical History:  Procedure Laterality Date   APPENDECTOMY     AUGMENTATION MAMMAPLASTY  1982   AUGMENTATION MAMMAPLASTY Bilateral 6546   silicone gel   BREAST IMPLANT EXCHANGE Bilateral 04/21/2016   Procedure: REMOVAL OF BREAST IMPLANTS WITH IMMEDIATE REPLACMENTOF BREAST IMPLANTS;  Surgeon: Youlanda Roys, MD;  Location: [place];  Service: Plastics;  Laterality: Bilateral;   BREAST SURGERY  2005   Breast Implant replaceddue to leaking    BREAST SURGERY Left 2006   due to asymmetry   CARDIAC CATHETERIZATION     2011 at [facility] - reflux related   CERVICAL BIOPSY  W/ LOOP ELECTRODE EXCISION     unsure pathology    CESAREAN SECTION  1985   COLONOSCOPY WITH PROPOFOL N/A 06/17/2015   Procedure: COLONOSCOPY WITH PROPOFOL;  Surgeon: Lucilla Lame, MD;  Location: [place];  Service: Endoscopy;  Laterality: N/A;  PT PREFERS EARLY AM   COLONOSCOPY WITH PROPOFOL N/A 07/16/2016   Procedure: COLONOSCOPY WITH PROPOFOL;  Surgeon: Lucilla Lame, MD;  Location: [place];  Service: Endoscopy;  Laterality: N/A;  requests early as possible   COSMETIC SURGERY     eyelid surgery   DILATION AND CURETTAGE OF UTERUS     ENDOMETRIAL BIOPSY     eye lid surgery     MASTECTOMY Left    new implant on the Right   MASTOPEXY Right 04/21/2016   Procedure: MASTOPEXY;  Surgeon: Youlanda Roys, MD;  Location: [place];  Service: Plastics;  Laterality: Right;   POLYPECTOMY  06/17/2015   Procedure: POLYPECTOMY;  Surgeon: Lucilla Lame, MD;  Location: [place];  Service: Endoscopy;;   TONSILLECTOMY  child   TUBAL  LIGATION      There were no vitals filed for this visit.  Subjective Assessment - 10/10/18 1346    Subjective  Pt continues to report 0/10 pain and complete compliance with her HEP. Pt reports major changes since last session.    Pertinent History  Osteoporosis, hypertension, history of breast cancer    Limitations  Walking;Lifting    How long can you sit comfortably?  Unlimited    How long can you stand comfortably?  Unlimited    How long can you walk comfortably?  ~1 mile    Patient Stated Goals  Return to walking, no pain when sleeping    Currently in Pain?  No/denies    Pain Score  0-No pain    Pain Onset  More than a month ago    Multiple Pain Sites  No         [facility] Adult PT Treatment/Exercise - 10/10/18 1351      Manual Therapy   Manual Therapy  Soft tissue  mobilization    Manual therapy comments  Sidelying ART with concurrent hip flexion/adduction      Manual Therapy to reduce muscle spasms and pain response. 15 min Bilateral ART to lateral hip with concurrent hip extension and adduction with patient positioned in sidelying.  Therapeutic Exercise to improve her hip strength, static balance, and motor control (knee/hips).  Supine bridging with GTB 2x12 each SL Squats with Knee Valgus GTB 2x12 each to focus on static balance with resisted knee valgus and to improve hip abduction strength.  Static Lunges 2x8 each side with focus on hip activation and knee control.     PT Education - 10/10/18 1352    Education Details  Form/technique with exercise, primarily with exaggerated hip abduction moment with incorporated resisted valgus at the knee in SL squats and static lunges. HEP continued as is.    Person(s) Educated  Patient    Methods  Explanation;Demonstration;Tactile cues;Verbal cues    Comprehension  Verbalized understanding;Returned demonstration;Verbal cues required;Need further instruction;Tactile cues required       PT Short Term Goals - 09/19/18 1312      PT SHORT TERM GOAL #1   Title  Pt will have 100% compliance with HEP to improve hip strength and walking tolerance    Baseline  04/27/2018: Side-lying hip abduction and wlkaing 3/4 mile every other day; 09/19/18: independent with HEP    Time  2    Period  Weeks    Status  Achieved    Target Date  05/11/18        PT Long Term Goals - 09/19/18 1311      PT LONG TERM GOAL #1   Title  Pt will have a decrease of at least 3 points on NPRS when sleeping to demonstrate improvement in therapy.    Baseline  04/27/2018: 5/10 NPRS; 06/02/2018: 3/10; 08/17/2018: 8/10; 09/19/18: 3/10    Time  6    Period  Weeks    Status  On-going      PT LONG TERM GOAL #2   Title  Pt will be able to walk 2 miles with no pain to return to recreational activities with friends.    Baseline  04/27/2018: 5/10  NPRS when walking 1 mile; 2/10 with walking 1 mile; 08/17/2018: Able to ambulate 5 miles with 5/10; able to amb without increase in pain    Time  6    Period  Weeks    Status  Achieved  PT LONG TERM GOAL #3   Title  Patient will demosntrate a worst pain of 1/10 to indicate significant improvement in pain and improvement overall with hip pain    Baseline  3/10; 06/02/2018: 3/10; 08/17/2018: 8/10; 09/19/18: 3/10    Time  6    Period  Weeks    Status  On-going            Plan - 10/10/18 1357    Clinical Impression Statement  Pt continues to demonstrate improved pain response and tolerance to increased exercise intensity. Todays session continued to focus on motor sequencing to improve her hip abduction in a SL squat and static lunging positioning. Pt responded well to targeted manual therapy interventions, citing decreased response on her R>L hip.  Pt required intermittent verbal and tactile cueing for appropriate technique and reported a consistent moderate difficulty with exercises throughout the session. Pt demonstrated decreased difficulty with single leg squatting with appropriate form, thus being able to progress to more of a single leg balance component. Pt tolerated static lunges well but with increased repetitions and fatigue, pt was rotating ipsilaterally to the leading leg. With cues, pt was able to optimize her positioning but inconsistently. At the end of session, pt reported a similar 0/10 pain, compared to the start of the session. Pt will continue to benefit from skilled therapy treatment to address and improve her strength and motor control, as well to continue to reduce her overall pain, which will allow her to return to prior level of function.    Personal Factors and Comorbidities  Age    Examination-Activity Limitations  Squat    Examination-Participation Restrictions  Community Activity    Stability/Clinical Decision Making  Stable/Uncomplicated    Clinical Decision  Making  Low    Clinical Presentation due to:  Stable/unchanging presentation and nature of injury.    Rehab Potential  Good    Clinical Impairments Affecting Rehab Potential  (+) Motivated, enjoys physical activity (-) Multiple comorbidities, hx of smoking, chronic pain    PT Frequency  2x / week    PT Duration  6 weeks    PT Treatment/Interventions  ADLs/Self Care Home Management;Cryotherapy;Electrical Stimulation;Moist Heat;Traction;Gait training;Stair training;Functional mobility training;Therapeutic activities;Therapeutic exercise;Neuromuscular re-education;Balance training;Patient/family education;Manual techniques;Energy conservation;Spinal Manipulations;Joint Manipulations;Taping    PT Next Visit Plan  Progress HEP, B LE strengthening/motor control    PT Home Exercise Plan  See education section.    Consulted and Agree with Plan of Care  Patient       Patient will benefit from skilled therapeutic intervention in order to improve the following deficits and impairments:  Improper body mechanics, Pain, Increased muscle spasms, Decreased activity tolerance, Decreased endurance, Decreased strength  Visit Diagnosis: 1. Chronic hip pain, bilateral   2. Muscle weakness (generalized)        Problem List Patient Active Problem List   Diagnosis Date Noted   Sprain and strain of hip and thigh 05/27/2018   Age-related osteoporosis without current pathological fracture 03/03/2018   Aortic atherosclerosis ([place]) 01/19/2017   Lymphocytic colitis 12/07/2016   Noninfectious diarrhea    Diarrhea of presumed infectious origin 06/26/2016   Coronary artery disease involving native coronary artery of native heart without angina pectoris 07/22/2015   Personal history of tobacco use, presenting hazards to health 06/26/2015   Rectal polyp    History of smoking 30 or more pack years 05/17/2015   Personal history of nicotine dependence 05/17/2015   Constipation 02/13/2015   Cramp in  lower leg  02/12/2015   Difficulty hearing 02/12/2015   Acid reflux 02/12/2015   B12 deficiency 02/12/2015   Abnormal blood sugar 02/12/2015   Hearing loss 02/12/2015   Hypothyroidism 09/10/2014   Insomnia 09/05/2014   Other specified postprocedural states 04/18/2013   S/P breast reconstruction, left 04/18/2013   Personal history of malignant neoplasm of breast 06/20/2012   History of abnormal Pap smear 06/20/2012   Benign hypertension 07/01/2009   Essential (primary) hypertension 07/01/2009   Genital herpes 03/26/2009   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 [place], [address] Phone: [phone]   Fax:  [phone]  Name: Adriana Hicks MRN: [id number] Date of Birth: 01-09-1946

## 2018-10-12 ENCOUNTER — Ambulatory Visit: Payer: Federal, State, Local not specified - PPO

## 2018-10-12 ENCOUNTER — Other Ambulatory Visit: Payer: Self-pay

## 2018-10-12 DIAGNOSIS — G8929 Other chronic pain: Secondary | ICD-10-CM

## 2018-10-12 DIAGNOSIS — M25552 Pain in left hip: Secondary | ICD-10-CM

## 2018-10-12 DIAGNOSIS — M6281 Muscle weakness (generalized): Secondary | ICD-10-CM

## 2018-10-12 NOTE — Therapy (Signed)
[facility] [address], [place], [address] Phone: [phone]   Fax:  [phone]  Physical Therapy Treatment  Patient Details  Name: Adriana Hicks MRN: [id number] Date of Birth: 17-Sep-1945 Referring Provider (PT): [name]   Encounter Date: 10/12/2018  PT End of Session - 10/12/18 1410    Visit Number  25    Number of Visits  29    Date for PT Re-Evaluation  10/17/18    PT Start Time  1300    PT Stop Time  1345    PT Time Calculation (min)  45 min    Activity Tolerance  Patient tolerated treatment well;No increased pain    Behavior During Therapy  [facility] for tasks assessed/performed       Past Medical History:  Diagnosis Date   Abnormal Pap smear of cervix 3/15   ASCUS HPV not detected   ADD (attention deficit disorder)    ADD (attention deficit disorder)    Anemia    Arthritis    fingers   Arthritis    Breast cancer ([place])    Left; Age 76   Bursitis    both hips   Colitis    Genital herpes 03/26/2009   GERD (gastroesophageal reflux disease)    History of chest pain    Related to reflux   HSV-2 (herpes simplex virus 2) infection    Hyperlipidemia    Hypertension    Hypothyroidism    Osteopenia    Personal history of chemotherapy    Personal history of tobacco use, presenting hazards to health 06/26/2015   S/P appendectomy 1981   Seborrheic dermatitis    Vertigo    x1 - approx 5 yrs ago    Past Surgical History:  Procedure Laterality Date   APPENDECTOMY     AUGMENTATION MAMMAPLASTY  1982   AUGMENTATION MAMMAPLASTY Bilateral 7408   silicone gel   BREAST IMPLANT EXCHANGE Bilateral 04/21/2016   Procedure: REMOVAL OF BREAST IMPLANTS WITH IMMEDIATE REPLACMENTOF BREAST IMPLANTS;  Surgeon: Youlanda Roys, MD;  Location: [place];  Service: Plastics;  Laterality: Bilateral;   BREAST SURGERY  2005   Breast Implant replaceddue to leaking    BREAST SURGERY Left 2006   due to asymmetry   CARDIAC CATHETERIZATION     2011 at [facility] - reflux related   CERVICAL BIOPSY  W/ LOOP ELECTRODE EXCISION     unsure pathology    CESAREAN SECTION  1985   COLONOSCOPY WITH PROPOFOL N/A 06/17/2015   Procedure: COLONOSCOPY WITH PROPOFOL;  Surgeon: Lucilla Lame, MD;  Location: [place];  Service: Endoscopy;  Laterality: N/A;  PT PREFERS EARLY AM   COLONOSCOPY WITH PROPOFOL N/A 07/16/2016   Procedure: COLONOSCOPY WITH PROPOFOL;  Surgeon: Lucilla Lame, MD;  Location: [place];  Service: Endoscopy;  Laterality: N/A;  requests early as possible   COSMETIC SURGERY     eyelid surgery   DILATION AND CURETTAGE OF UTERUS     ENDOMETRIAL BIOPSY     eye lid surgery     MASTECTOMY Left    new implant on the Right   MASTOPEXY Right 04/21/2016   Procedure: MASTOPEXY;  Surgeon: Youlanda Roys, MD;  Location: [place];  Service: Plastics;  Laterality: Right;   POLYPECTOMY  06/17/2015   Procedure: POLYPECTOMY;  Surgeon: Lucilla Lame, MD;  Location: [place];  Service: Endoscopy;;   TONSILLECTOMY  child   TUBAL  LIGATION      There were no vitals filed for this visit.  Subjective Assessment - 10/12/18 1407    Subjective  Pt reported 0/10 pain and independence with HEP. No major changes since last session.    Pertinent History  Osteoporosis, hypertension, history of breast cancer    Limitations  Walking;Lifting    How long can you sit comfortably?  Unlimited    How long can you stand comfortably?  Unlimited    How long can you walk comfortably?  ~1 mile    Patient Stated Goals  Return to walking, no pain when sleeping    Currently in Pain?  No/denies    Pain Score  0-No pain    Pain Onset  More than a month ago    Multiple Pain Sites  No      Manual therapy to reduce muscle spasm and pain.  STM to bilateral, lateral hips (glute med/max) with concurrent ART hip flexion, hip  adduction. In side-lying positioning.  Therapeutic Exercise to improve hip abduction strengthening and motor control for bilateral hip/knees to reduce knee valgus stressors.  SLS with GTB for valgus stress. 2x12 each leg with UE support SL Rear-foot elevated split squat 2x10 each leg with UE support SL RDL with 3# weight and cone taps 1x15 each leg  Performed exercises to improve hip abduction strength and knee motor control in SL.   At the end of the session, pt continues to report 0/10 pain and moderate soreness.     PT Education - 10/12/18 1408    Education Details  Form/technique with exercise, primarily with exaggerated hip abduction moment with incorporated resisted valgus at the knee in SL squats and static lunges. HEP progressed to include GTB (progressed from RTB) for supine bridging and monster walks.    Person(s) Educated  Patient    Methods  Explanation;Demonstration;Tactile cues;Verbal cues    Comprehension  Verbalized understanding;Tactile cues required;Need further instruction;Returned demonstration;Verbal cues required       PT Short Term Goals - 09/19/18 1312      PT SHORT TERM GOAL #1   Title  Pt will have 100% compliance with HEP to improve hip strength and walking tolerance    Baseline  04/27/2018: Side-lying hip abduction and wlkaing 3/4 mile every other day; 09/19/18: independent with HEP    Time  2    Period  Weeks    Status  Achieved    Target Date  05/11/18        PT Long Term Goals - 09/19/18 1311      PT LONG TERM GOAL #1   Title  Pt will have a decrease of at least 3 points on NPRS when sleeping to demonstrate improvement in therapy.    Baseline  04/27/2018: 5/10 NPRS; 06/02/2018: 3/10; 08/17/2018: 8/10; 09/19/18: 3/10    Time  6    Period  Weeks    Status  On-going      PT LONG TERM GOAL #2   Title  Pt will be able to walk 2 miles with no pain to return to recreational activities with friends.    Baseline  04/27/2018: 5/10 NPRS when walking 1 mile;  2/10 with walking 1 mile; 08/17/2018: Able to ambulate 5 miles with 5/10; able to amb without increase in pain    Time  6    Period  Weeks    Status  Achieved      PT LONG TERM GOAL #3   Title  Patient  will demosntrate a worst pain of 1/10 to indicate significant improvement in pain and improvement overall with hip pain    Baseline  3/10; 06/02/2018: 3/10; 08/17/2018: 8/10; 09/19/18: 3/10    Time  6    Period  Weeks    Status  On-going            Plan - 10/12/18 1414    Clinical Impression Statement  Pt continues to demonstrate resolution of pain (only muscular soreness) and tolerance to increased exercise intensity. Todays session continued to focus on motor sequencing to improve her hip abduction with SL variations (squat, elevated rear-foot split squats, RDL). Pt responded well to targeted manual therapy interventions, citing decreased response on her R>L hip. Pt required intermittent verbal and tactile cueing for appropriate technique and reported a consistent moderate difficulty with exercises throughout the session. Pt demonstrated decreased difficulty with single leg squatting (with balance) with appropriate form, thus being able to progress to GTB. Pt demonstrated decreased compensatory strategies, though although she required intermittent tactile/verbal cueing to do so. Pt still demonstrates impaired motor control at bilateral knees and hips. At the end of session, pt reported a similar 0/10 pain, compared to the start of the session. Pt will continue to benefit from skilled therapy treatment to address and improve her strength and motor control, as well to continue to continue to reduce her overall pain, which will allow her to return to prior level of function    Personal Factors and Comorbidities  Age    Examination-Activity Limitations  Squat    Examination-Participation Restrictions  Community Activity    Stability/Clinical Decision Making  Stable/Uncomplicated    Clinical  Decision Making  Low    Clinical Presentation due to:  Unchanging/stable presentation and nature of injury    Rehab Potential  Good    Clinical Impairments Affecting Rehab Potential  (+) Motivated, enjoys physical activity (-) Multiple comorbidities, hx of smoking, chronic pain    PT Frequency  2x / week    PT Duration  6 weeks    PT Treatment/Interventions  ADLs/Self Care Home Management;Cryotherapy;Electrical Stimulation;Moist Heat;Traction;Gait training;Stair training;Functional mobility training;Therapeutic activities;Therapeutic exercise;Neuromuscular re-education;Balance training;Patient/family education;Manual techniques;Energy conservation;Spinal Manipulations;Joint Manipulations;Taping    PT Next Visit Plan  Progress HEP, B LE strengthening/motor control    PT Home Exercise Plan  See education section.    Consulted and Agree with Plan of Care  Patient       Patient will benefit from skilled therapeutic intervention in order to improve the following deficits and impairments:  Improper body mechanics, Pain, Increased muscle spasms, Decreased activity tolerance, Decreased endurance, Decreased strength  Visit Diagnosis: 1. Chronic hip pain, bilateral   2. Muscle weakness (generalized)        Problem List Patient Active Problem List   Diagnosis Date Noted   Sprain and strain of hip and thigh 05/27/2018   Age-related osteoporosis without current pathological fracture 03/03/2018   Aortic atherosclerosis ([place]) 01/19/2017   Lymphocytic colitis 12/07/2016   Noninfectious diarrhea    Diarrhea of presumed infectious origin 06/26/2016   Coronary artery disease involving native coronary artery of native heart without angina pectoris 07/22/2015   Personal history of tobacco use, presenting hazards to health 06/26/2015   Rectal polyp    History of smoking 30 or more pack years 05/17/2015   Personal history of nicotine dependence 05/17/2015   Constipation 02/13/2015   Cramp  in lower leg 02/12/2015   Difficulty hearing 02/12/2015   Acid reflux 02/12/2015  B12 deficiency 02/12/2015   Abnormal blood sugar 02/12/2015   Hearing loss 02/12/2015   Hypothyroidism 09/10/2014   Insomnia 09/05/2014   Other specified postprocedural states 04/18/2013   S/P breast reconstruction, left 04/18/2013   Personal history of malignant neoplasm of breast 06/20/2012   History of abnormal Pap smear 06/20/2012   Benign hypertension 07/01/2009   Essential (primary) hypertension 07/01/2009   Genital herpes 03/26/2009   Herpesviral infection of urogenital system 03/26/2009   ADD (attention deficit hyperactivity disorder, 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, [place], [address] Phone: [phone]   Fax:  [phone]  Name: Adriana Hicks MRN: [id number] Date of Birth: [birth year]

## 2018-10-17 ENCOUNTER — Ambulatory Visit: Payer: Federal, State, Local not specified - PPO

## 2018-10-17 ENCOUNTER — Other Ambulatory Visit: Payer: Self-pay

## 2018-10-17 DIAGNOSIS — G8929 Other chronic pain: Secondary | ICD-10-CM

## 2018-10-17 DIAGNOSIS — M6281 Muscle weakness (generalized): Secondary | ICD-10-CM

## 2018-10-17 DIAGNOSIS — M25552 Pain in left hip: Secondary | ICD-10-CM | POA: Diagnosis not present

## 2018-10-17 NOTE — Therapy (Signed)
[facility] [address], [place], [address] Phone: [phone]   Fax:  [phone]  Physical Therapy Treatment, Progress Note, and Re-certification  Dates of Reporting Period: 09/19/2018-10/17/2018   Patient Details  Name: Adriana Hicks MRN: [id number] Date of Birth: 07/04/1945 Referring Provider (PT): [name]   Encounter Date: 10/17/2018  PT End of Session - 10/17/18 1404    Visit Number  26    Number of Visits  29    Date for PT Re-Evaluation  10/17/18    PT Start Time  6962    PT Stop Time  1345    PT Time Calculation (min)  40 min    Activity Tolerance  Patient tolerated treatment well;No increased pain    Behavior During Therapy  [facility] for tasks assessed/performed       Past Medical History:  Diagnosis Date   Abnormal Pap smear of cervix 3/15   ASCUS HPV not detected   ADD (attention deficit disorder)    ADD (attention deficit disorder)    Anemia    Arthritis    fingers   Arthritis    Breast cancer ([place])    Left; Age 35   Bursitis    both hips   Colitis    Genital herpes 03/26/2009   GERD (gastroesophageal reflux disease)    History of chest pain    Related to reflux   HSV-2 (herpes simplex virus 2) infection    Hyperlipidemia    Hypertension    Hypothyroidism    Osteopenia    Personal history of chemotherapy    Personal history of tobacco use, presenting hazards to health 06/26/2015   S/P appendectomy 1981   Seborrheic dermatitis    Vertigo    x1 - approx 5 yrs ago    Past Surgical History:  Procedure Laterality Date   APPENDECTOMY     AUGMENTATION MAMMAPLASTY  1982   AUGMENTATION MAMMAPLASTY Bilateral 9528   silicone gel   BREAST IMPLANT EXCHANGE Bilateral 04/21/2016   Procedure: REMOVAL OF BREAST IMPLANTS WITH IMMEDIATE REPLACMENTOF BREAST IMPLANTS;  Surgeon: Youlanda Roys, MD;  Location: [place];  Service: Plastics;   Laterality: Bilateral;   BREAST SURGERY  2005   Breast Implant replaceddue to leaking   BREAST SURGERY Left 2006   due to asymmetry   CARDIAC CATHETERIZATION     2011 at [facility] - reflux related   CERVICAL BIOPSY  W/ LOOP ELECTRODE EXCISION     unsure pathology    CESAREAN SECTION  1985   COLONOSCOPY WITH PROPOFOL N/A 06/17/2015   Procedure: COLONOSCOPY WITH PROPOFOL;  Surgeon: Lucilla Lame, MD;  Location: [place];  Service: Endoscopy;  Laterality: N/A;  PT PREFERS EARLY AM   COLONOSCOPY WITH PROPOFOL N/A 07/16/2016   Procedure: COLONOSCOPY WITH PROPOFOL;  Surgeon: Lucilla Lame, MD;  Location: [place];  Service: Endoscopy;  Laterality: N/A;  requests early as possible   COSMETIC SURGERY     eyelid surgery   DILATION AND CURETTAGE OF UTERUS     ENDOMETRIAL BIOPSY     eye lid surgery     MASTECTOMY Left    new implant on the Right   MASTOPEXY Right 04/21/2016   Procedure: MASTOPEXY;  Surgeon: Youlanda Roys, MD;  Location: [place];  Service: Plastics;  Laterality: Right;   POLYPECTOMY  06/17/2015   Procedure: POLYPECTOMY;  Surgeon: Lucilla Lame, MD;  Location: [place];  Service: Endoscopy;;   TONSILLECTOMY  child   TUBAL LIGATION      There were no vitals filed for this visit.  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 Pt reports a current 0/10 pain and partial compliance with her HEP. Pt is encouraged by her progress with HEP but would like to continue to further manage her sx when they arise.    Pertinent History  Osteoporosis, hypertension, history of breast cancer    Limitations  Walking;Lifting    How long can you sit comfortably?  Unlimited    How long can you stand  comfortably?  Unlimited    How long can you walk comfortably?  ~1 mile    Patient Stated Goals  Return to walking, no pain when sleeping    Currently in Pain?  No/denies    Pain Score  0-No pain    Pain Onset  More than a month ago    Multiple Pain Sites  No       Manual therapy to reduce muscle spasm and pain.   STM to bilateral, lateral hips (glute med/max) with concurrent ART hip flexion, hip adduction. In side-lying positioning.  Lacrosse Ball 5 min to bilateral hips (piriformis, greater trochanter region) in supine positioning.   Therapeutic Exercise to improve hip abduction strengthening and motor control for bilateral hip/knees to reduce knee valgus stressors.   Supine SL Bridge with GTB and hip abductions 2x15 SL Rear-foot elevated split squat 2x10 each leg with UE support   Performed exercises to improve hip abduction strength and knee motor control in SL.    At the end of the session, pt continues to report 0/10 pain and moderate soreness.      PT Education - 10/17/18 1402    Education Details  Pt continued to be educated on plan of care, with possible d/c with HEP pending next visit's progress. Pt educated on HEP progression: supine SL bridge with GTB and hip abduction (2x15), seated piriformis stretch (3x30s), and self-STM with lacrosse ball x5 min each side. Pt demonstrated understanding and improved technique.    Person(s) Educated  Patient    Methods  Explanation;Tactile cues;Demonstration;Verbal cues;Handout    Comprehension  Verbalized understanding;Returned demonstration;Verbal cues required;Tactile cues required       PT Short Term Goals - 09/19/18 1312      PT SHORT TERM GOAL #1   Title  Pt will have 100% compliance with HEP to improve hip strength and walking tolerance    Baseline  04/27/2018: Side-lying hip abduction and wlkaing 3/4 mile every other day; 09/19/18: independent with HEP    Time  2    Period  Weeks    Status  Achieved    Target Date   05/11/18        PT Long Term Goals - 10/17/18 1413      PT LONG TERM GOAL #1   Title  Pt will have a decrease of at least 3 points on NPRS when sleeping to demonstrate improvement in therapy.    Baseline  04/27/2018: 5/10 NPRS; 06/02/2018: 3/10; 08/17/2018: 8/10; 09/19/18: 3/10; 10/17/2018: 4-5/10    Time  6    Period  Weeks    Status  On-going    Target Date  11/14/18  PT LONG TERM GOAL #2   Title  Pt will be able to walk 2 miles with no pain to return to recreational activities with friends.    Baseline  04/27/2018: 5/10 NPRS when walking 1 mile; 2/10 with walking 1 mile; 08/17/2018: Able to ambulate 5 miles with 5/10; able to amb without increase in pain    Time  6    Period  Weeks    Status  Achieved      PT LONG TERM GOAL #3   Title  Patient will demosntrate a worst pain of 1/10 to indicate significant improvement in pain and improvement overall with hip pain    Baseline  3/10; 06/02/2018: 3/10; 08/17/2018: 8/10; 09/19/18: 3/10; 10/17/2018 0/10 (but 10/10 last night)    Time  4    Period  Weeks    Status  Partially Met    Target Date  11/14/18            Plan - 10/17/18 1405    Clinical Impression Statement  Pt continues to demonstrate resolution of pain (only muscular soreness) and improved tolerance to increased exercise intensity, despite pts recent sx exacerbation (yesterday, 10/10 pain bilaterally) with prolonged standing activity. Pt responded well to targeted manual therapy interventions, as well as incorporated self-STM technique with lacrosse ball and seated piriformis stretching for self-management (with concordant sx). Pts technique with bridging and SL elevated rear-foot split squats improved with decreased cueing. Pt still demonstrates impaired motor control at bilateral knees and hips but no changes in sx with active movement. Pt continues to exhibit 4-5/10 pain with sleeping on average, with yesterday being 10/10 and worse than most nights and requiring pain  medication usage. At the end of session, pt reported a similar 0/10 pain, compared to the start of the session, and pt was given an updated HEP to reflect changes made. Pt will continue to benefit from skilled therapy treatment to address and improve her strength and motor control, as well to affirm and finalize a strong HEP which focuses on self-management strategies for pain management, which will allow her to return to prior level of function and independence.    Personal Factors and Comorbidities  Age    Examination-Activity Limitations  Squat    Examination-Participation Restrictions  Community Activity    Stability/Clinical Decision Making  Stable/Uncomplicated    Clinical Decision Making  Low    Clinical Presentation due to:  Unchanging presentation, nature of injury    Rehab Potential  Good    Clinical Impairments Affecting Rehab Potential  (+) Motivated, enjoys physical activity (-) Multiple comorbidities, hx of smoking, chronic pain    PT Frequency  2x / week    PT Duration  4 weeks    PT Treatment/Interventions  ADLs/Self Care Home Management;Cryotherapy;Electrical Stimulation;Moist Heat;Traction;Gait training;Stair training;Functional mobility training;Therapeutic activities;Therapeutic exercise;Neuromuscular re-education;Balance training;Patient/family education;Manual techniques;Energy conservation;Spinal Manipulations;Joint Manipulations;Taping    PT Next Visit Plan  Finalize HEP, progress strengthening and pain management    PT Home Exercise Plan  See education section.    Consulted and Agree with Plan of Care  Patient       Patient will benefit from skilled therapeutic intervention in order to improve the following deficits and impairments:  Improper body mechanics, Pain, Increased muscle spasms, Decreased activity tolerance, Decreased endurance, Decreased strength  Visit Diagnosis: 1. Chronic hip pain, bilateral   2. Muscle weakness (generalized)        Problem  List Patient Active Problem List   Diagnosis Date Noted  Sprain and strain of hip and thigh 05/27/2018   Age-related osteoporosis without current pathological fracture 03/03/2018   Aortic atherosclerosis ([place]) 01/19/2017   Lymphocytic colitis 12/07/2016   Noninfectious diarrhea    Diarrhea of presumed infectious origin 06/26/2016   Coronary artery disease involving native coronary artery of native heart without angina pectoris 07/22/2015   Personal history of tobacco use, presenting hazards to health 06/26/2015   Rectal polyp    History of smoking 30 or more pack years 05/17/2015   Personal history of nicotine dependence 05/17/2015   Constipation 02/13/2015   Cramp in lower leg 02/12/2015   Difficulty hearing 02/12/2015   Acid reflux 02/12/2015   B12 deficiency 02/12/2015   Abnormal blood sugar 02/12/2015   Hearing loss 02/12/2015   Hypothyroidism 09/10/2014   Insomnia 09/05/2014   Other specified postprocedural states 04/18/2013   S/P breast reconstruction, left 04/18/2013   Personal history of malignant neoplasm of breast 06/20/2012   History of abnormal Pap smear 06/20/2012   Benign hypertension 07/01/2009   Essential (primary) hypertension 07/01/2009   Genital herpes 03/26/2009   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 I have personally read, edited and approve of the note as written.  [place] [facility] [address] [place], [address] Phone: [phone]   Fax:  [phone]  Name: Adriana Hicks MRN: [id number] Date of Birth: [birth year]

## 2018-10-19 ENCOUNTER — Ambulatory Visit: Payer: Federal, State, Local not specified - PPO

## 2018-10-25 ENCOUNTER — Other Ambulatory Visit: Payer: Self-pay

## 2018-10-25 ENCOUNTER — Ambulatory Visit: Payer: Federal, State, Local not specified - PPO | Attending: Student

## 2018-10-25 DIAGNOSIS — M25552 Pain in left hip: Secondary | ICD-10-CM | POA: Insufficient documentation

## 2018-10-25 DIAGNOSIS — M6281 Muscle weakness (generalized): Secondary | ICD-10-CM

## 2018-10-25 DIAGNOSIS — M25551 Pain in right hip: Secondary | ICD-10-CM | POA: Insufficient documentation

## 2018-10-25 DIAGNOSIS — G8929 Other chronic pain: Secondary | ICD-10-CM

## 2018-10-25 NOTE — Therapy (Signed)
[place] [facility] [address], [place], [address] Phone: [phone]   Fax:  [phone]  Physical Therapy Treatment  Patient Details  Name: Adriana Hicks MRN: [id number] Date of Birth: 1945-10-22 Referring Provider (PT): [name]   Encounter Date: 10/25/2018  PT End of Session - 10/25/18 1525    Visit Number  27    Number of Visits  29    Date for PT Re-Evaluation  11/14/18    PT Start Time  1115    PT Stop Time  1200    PT Time Calculation (min)  45 min    Activity Tolerance  Patient tolerated treatment well;No increased pain    Behavior During Therapy  [facility] for tasks assessed/performed       Past Medical History:  Diagnosis Date   Abnormal Pap smear of cervix 3/15   ASCUS HPV not detected   ADD (attention deficit disorder)    ADD (attention deficit disorder)    Anemia    Arthritis    fingers   Arthritis    Breast cancer ([place])    Left; Age 73   Bursitis    both hips   Colitis    Genital herpes 03/26/2009   GERD (gastroesophageal reflux disease)    History of chest pain    Related to reflux   HSV-2 (herpes simplex virus 2) infection    Hyperlipidemia    Hypertension    Hypothyroidism    Osteopenia    Personal history of chemotherapy    Personal history of tobacco use, presenting hazards to health 06/26/2015   S/P appendectomy 1981   Seborrheic dermatitis    Vertigo    x1 - approx 5 yrs ago    Past Surgical History:  Procedure Laterality Date   APPENDECTOMY     AUGMENTATION MAMMAPLASTY  1982   AUGMENTATION MAMMAPLASTY Bilateral 3220   silicone gel   BREAST IMPLANT EXCHANGE Bilateral 04/21/2016   Procedure: REMOVAL OF BREAST IMPLANTS WITH IMMEDIATE REPLACMENTOF BREAST IMPLANTS;  Surgeon: Youlanda Roys, MD;  Location: [place];  Service: Plastics;  Laterality: Bilateral;   BREAST SURGERY  2005   Breast Implant replaceddue to leaking    BREAST SURGERY Left 2006   due to asymmetry   CARDIAC CATHETERIZATION     [facility] at [facility] - reflux related   CERVICAL BIOPSY  W/ LOOP ELECTRODE EXCISION     unsure pathology    CESAREAN SECTION  1985   COLONOSCOPY WITH PROPOFOL N/A 06/17/2015   Procedure: COLONOSCOPY WITH PROPOFOL;  Surgeon: Lucilla Lame, MD;  Location: [place];  Service: Endoscopy;  Laterality: N/A;  PT PREFERS EARLY AM   COLONOSCOPY WITH PROPOFOL N/A 07/16/2016   Procedure: COLONOSCOPY WITH PROPOFOL;  Surgeon: Lucilla Lame, MD;  Location: [place];  Service: Endoscopy;  Laterality: N/A;  requests early as possible   COSMETIC SURGERY     eyelid surgery   DILATION AND CURETTAGE OF UTERUS     ENDOMETRIAL BIOPSY     eye lid surgery     MASTECTOMY Left    new implant on the Right   MASTOPEXY Right 04/21/2016   Procedure: MASTOPEXY;  Surgeon: Youlanda Roys, MD;  Location: [place];  Service: Plastics;  Laterality: Right;   POLYPECTOMY  06/17/2015   Procedure: POLYPECTOMY;  Surgeon: Lucilla Lame, MD;  Location: [place];  Service: Endoscopy;;   TONSILLECTOMY  child   TUBAL  LIGATION      There were no vitals filed for this visit.  Subjective Assessment - 10/25/18 1125    Subjective  Pt reports having current 0/10 pain and significantly improved sleep health, citing that she has enjoyed her uninterrupted sleep the past week or so. Reports resolved night pain. She states that she has stopped taking Celebrex or Tylenol for pain management.    Pertinent History  Osteoporosis, hypertension, history of breast cancer    Limitations  Walking;Lifting    How long can you sit comfortably?  Unlimited    How long can you stand comfortably?  Unlimited    How long can you walk comfortably?  ~1 mile    Patient Stated Goals  Return to walking, no pain when sleeping    Currently in Pain?  No/denies    Pain Score  0-No pain    Pain Onset  More than a month ago       Therapeutic Exercise to improve her strength, motor control, and activity tolerance with mobility.  Forward/Backward Leg Swings, 1 min Side-to-Side Leg Swings, 1 min Seated Pigeon Stretch 3x30 seconds Lacrosse ball, seated, 5 min SL bridge with abduction 2x15 Monster Walks GTB around ankles 4-5 times there and back (~20 feet) Static Lunges, alternating, 2x15 SL Squats with UE support 2x10 SL running man with UE support 2x10 Rearfoot elevated Split Squat x10 each side    At the end of the session, pt reported a continued 0/10 pain.               PT Education - 10/25/18 1521    Education Details  Pt educated on technique/form. HEP given to include: Forward/backward and side-to-side hip swings 1 min each, lacrosse ball STM 5 min, seated pigeon stretch 30s x3, SL bridge with abduction and GTB 2x15, lateral monster walks GTB around ankles 4-5x there and backs, alternating static lunges 2x15, SL squats with UE support 2x10, SL running man with UE support 2x10.    Person(s) Educated  Patient    Methods  Explanation;Demonstration;Handout;Verbal cues    Comprehension  Verbalized understanding;Returned demonstration       PT Short Term Goals - 09/19/18 1312      PT SHORT TERM GOAL #1   Title  Pt will have 100% compliance with HEP to improve hip strength and walking tolerance    Baseline  04/27/2018: Side-lying hip abduction and wlkaing 3/4 mile every other day; 09/19/18: independent with HEP    Time  2    Period  Weeks    Status  Achieved    Target Date  05/11/18        PT Long Term Goals - 10/17/18 1413      PT LONG TERM GOAL #1   Title  Pt will have a decrease of at least 3 points on NPRS when sleeping to demonstrate improvement in therapy.    Baseline  04/27/2018: 5/10 NPRS; 06/02/2018: 3/10; 08/17/2018: 8/10; 09/19/18: 3/10; 10/17/2018: 4-5/10    Time  6    Period  Weeks    Status  On-going    Target Date  11/14/18      PT LONG TERM GOAL #2   Title  Pt will be  able to walk 2 miles with no pain to return to recreational activities with friends.    Baseline  04/27/2018: 5/10 NPRS when walking 1 mile; 2/10 with walking 1 mile; 08/17/2018: Able to ambulate 5 miles with 5/10; able to amb without increase in  pain    Time  6    Period  Weeks    Status  Achieved      PT LONG TERM GOAL #3   Title  Patient will demosntrate a worst pain of 1/10 to indicate significant improvement in pain and improvement overall with hip pain    Baseline  3/10; 06/02/2018: 3/10; 08/17/2018: 8/10; 09/19/18: 3/10; 10/17/2018 0/10 (but 10/10 last night)    Time  4    Period  Weeks    Status  Partially Met    Target Date  11/14/18            Plan - 10/25/18 1526    Clinical Impression Statement  Pt continues to demonstrate improved activity tolerance, strength, and pain response, as indicated by her consistent 0/10 pain during the day and at night, improved ability to tolerate increased loads of exercise, and improved technique/form requiring a decreased level of UE support and cueing. Pt given a finalized and modified HEP, in which she demonstrated great technique and moderate difficulty with (though successful). Pt verbalized and demonstrated understanding for the program and the rationale for each component. Pt will return in 2 weeks to progress and refine her HEP, with the possibility of d/c. Pt will benefit from skilled therapy treatment to address her HEP and goals, which will allow her to return to prior level of function.    Personal Factors and Comorbidities  Age    Examination-Activity Limitations  Squat    Examination-Participation Restrictions  Community Activity    Stability/Clinical Decision Making  Stable/Uncomplicated    Rehab Potential  Good    Clinical Impairments Affecting Rehab Potential  (+) Motivated, enjoys physical activity (-) Multiple comorbidities, hx of smoking, chronic pain    PT Frequency  2x / week    PT Duration  4 weeks    PT  Treatment/Interventions  ADLs/Self Care Home Management;Cryotherapy;Electrical Stimulation;Moist Heat;Traction;Gait training;Stair training;Functional mobility training;Therapeutic activities;Therapeutic exercise;Neuromuscular re-education;Balance training;Patient/family education;Manual techniques;Energy conservation;Spinal Manipulations;Joint Manipulations;Taping    PT Next Visit Plan  Finalize HEP, progress strengthening and pain management    PT Home Exercise Plan  See education section.    Consulted and Agree with Plan of Care  Patient       Patient will benefit from skilled therapeutic intervention in order to improve the following deficits and impairments:  Improper body mechanics, Pain, Increased muscle spasms, Decreased activity tolerance, Decreased endurance, Decreased strength  Visit Diagnosis: 1. Chronic hip pain, bilateral   2. Muscle weakness (generalized)        Problem List Patient Active Problem List   Diagnosis Date Noted   Sprain and strain of hip and thigh 05/27/2018   Age-related osteoporosis without current pathological fracture 03/03/2018   Aortic atherosclerosis ([place]) 01/19/2017   Lymphocytic colitis 12/07/2016   Noninfectious diarrhea    Diarrhea of presumed infectious origin 06/26/2016   Coronary artery disease involving native coronary artery of native heart without angina pectoris 07/22/2015   Personal history of tobacco use, presenting hazards to health 06/26/2015   Rectal polyp    History of smoking 30 or more pack years 05/17/2015   Personal history of nicotine dependence 05/17/2015   Constipation 02/13/2015   Cramp in lower leg 02/12/2015   Difficulty hearing 02/12/2015   Acid reflux 02/12/2015   B12 deficiency 02/12/2015   Abnormal blood sugar 02/12/2015   Hearing loss 02/12/2015   Hypothyroidism 09/10/2014   Insomnia 09/05/2014   Other specified postprocedural states 04/18/2013   S/P breast reconstruction,  left  04/18/2013   Personal history of malignant neoplasm of breast 06/20/2012   History of abnormal Pap smear 06/20/2012   Benign hypertension 07/01/2009   Essential (primary) hypertension 07/01/2009   Genital herpes 03/26/2009   Herpesviral infection of urogenital system 03/26/2009   ADD (attention deficit hyperactivity disorder, 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 [place], [address] Phone: [phone]   Fax:  [phone]  Name: ELECTRA PALADINO MRN: [id number] Date of Birth: 1945-08-13

## 2018-11-09 ENCOUNTER — Other Ambulatory Visit: Payer: Self-pay

## 2018-11-09 ENCOUNTER — Ambulatory Visit: Payer: Federal, State, Local not specified - PPO

## 2018-11-09 DIAGNOSIS — G8929 Other chronic pain: Secondary | ICD-10-CM

## 2018-11-09 DIAGNOSIS — M6281 Muscle weakness (generalized): Secondary | ICD-10-CM

## 2018-11-09 DIAGNOSIS — M25552 Pain in left hip: Secondary | ICD-10-CM

## 2018-11-09 DIAGNOSIS — M25551 Pain in right hip: Secondary | ICD-10-CM | POA: Diagnosis not present

## 2018-11-09 NOTE — Therapy (Signed)
[facility] [address] [place], [address] Phone: [phone]   Fax:  [phone]  Physical Therapy Treatment  Patient Details  Name: Adriana Hicks MRN: [id number] Date of Birth: 07-04-1945 Referring Provider (PT): [name]   Encounter Date: 11/09/2018  PT End of Session - 11/09/18 1400    Visit Number  28    Number of Visits  29    Date for PT Re-Evaluation  11/14/18    PT Start Time  1100    PT Stop Time  1145    PT Time Calculation (min)  45 min    Activity Tolerance  Patient tolerated treatment well;No increased pain    Behavior During Therapy  [facility] for tasks assessed/performed       Past Medical History:  Diagnosis Date   Abnormal Pap smear of cervix 3/15   ASCUS HPV not detected   ADD (attention deficit disorder)    ADD (attention deficit disorder)    Anemia    Arthritis    fingers   Arthritis    Breast cancer ([place])    Left; Age 54   Bursitis    both hips   Colitis    Genital herpes 03/26/2009   GERD (gastroesophageal reflux disease)    History of chest pain    Related to reflux   HSV-2 (herpes simplex virus 2) infection    Hyperlipidemia    Hypertension    Hypothyroidism    Osteopenia    Personal history of chemotherapy    Personal history of tobacco use, presenting hazards to health 06/26/2015   S/P appendectomy 1981   Seborrheic dermatitis    Vertigo    x1 - approx 5 yrs ago    Past Surgical History:  Procedure Laterality Date   APPENDECTOMY     AUGMENTATION MAMMAPLASTY  1982   AUGMENTATION MAMMAPLASTY Bilateral 4174   silicone gel   BREAST IMPLANT EXCHANGE Bilateral 04/21/2016   Procedure: REMOVAL OF BREAST IMPLANTS WITH IMMEDIATE REPLACMENTOF BREAST IMPLANTS;  Surgeon: Youlanda Roys, MD;  Location: [place];  Service: Plastics;  Laterality: Bilateral;   BREAST SURGERY  2005   Breast Implant replaceddue to leaking    BREAST SURGERY Left 2006   due to asymmetry   CARDIAC CATHETERIZATION     2011 at [facility] - reflux related   CERVICAL BIOPSY  W/ LOOP ELECTRODE EXCISION     unsure pathology    CESAREAN SECTION  1985   COLONOSCOPY WITH PROPOFOL N/A 06/17/2015   Procedure: COLONOSCOPY WITH PROPOFOL;  Surgeon: Lucilla Lame, MD;  Location: [place];  Service: Endoscopy;  Laterality: N/A;  PT PREFERS EARLY AM   COLONOSCOPY WITH PROPOFOL N/A 07/16/2016   Procedure: COLONOSCOPY WITH PROPOFOL;  Surgeon: Lucilla Lame, MD;  Location: [place];  Service: Endoscopy;  Laterality: N/A;  requests early as possible   COSMETIC SURGERY     eyelid surgery   DILATION AND CURETTAGE OF UTERUS     ENDOMETRIAL BIOPSY     eye lid surgery     MASTECTOMY Left    new implant on the Right   MASTOPEXY Right 04/21/2016   Procedure: MASTOPEXY;  Surgeon: Youlanda Roys, MD;  Location: [place];  Service: Plastics;  Laterality: Right;   POLYPECTOMY  06/17/2015   Procedure: POLYPECTOMY;  Surgeon: Lucilla Lame, MD;  Location: [place];  Service: Endoscopy;;   TONSILLECTOMY  child   TUBAL  LIGATION      There were no vitals filed for this visit.  Subjective Assessment - 11/09/18 1348    Subjective  Pt continues to report a resting 0/10 pain. Pt has been on her own with HEP the past two weeks and reports improved sleep and overall function.Pt states that she is still having increased pain at night (dull, achiness) but at a decreased frequency. Pt wishes to continue PT 1x/week to improve hip strength.    Pertinent History  Osteoporosis, hypertension, history of breast cancer    Limitations  Walking;Lifting    How long can you sit comfortably?  Unlimited    How long can you stand comfortably?  Unlimited    How long can you walk comfortably?  ~1 mile    Patient Stated Goals  Return to walking, no pain when sleeping    Currently in Pain?  No/denies    Pain Score   0-No pain    Pain Onset  More than a month ago       Therapeutic Exercise to improve hip abduction strengthening and motor control for bilateral hip/knees to reduce knee valgus stressors.   Supine SL Bridge with GTB and hip abductions 2x15 Lateral Monster Walks 20 feet x4 each Forward Monster Walks 20 feet x4 each DL Squats with GTB Valgus 2x10, mirror for visual cues SL Rear-foot elevated split squat 2x10 each leg with UE support Hip Lateral Swings x 1 min each, B Hip Forward/Rearward Swings x 1 min each, B   Performed exercises to improve hip abduction strength and knee motor control in SL.    At the end of the session, pt continues to report 0/10 pain and moderate soreness.        PT Education - 11/09/18 1358    Education Details  Pt educated on technique/form. HEP modified to replace lateral monster walks with forward monster walks with GTB. Updated on plan of care to 1x/week for general hip strengthening and sx management.    Person(s) Educated  Patient    Methods  Explanation;Demonstration;Tactile cues;Verbal cues    Comprehension  Verbalized understanding;Returned demonstration;Verbal cues required;Tactile cues required;Need further instruction       PT Short Term Goals - 09/19/18 1312      PT SHORT TERM GOAL #1   Title  Pt will have 100% compliance with HEP to improve hip strength and walking tolerance    Baseline  04/27/2018: Side-lying hip abduction and wlkaing 3/4 mile every other day; 09/19/18: independent with HEP    Time  2    Period  Weeks    Status  Achieved    Target Date  05/11/18        PT Long Term Goals - 10/17/18 1413      PT LONG TERM GOAL #1   Title  Pt will have a decrease of at least 3 points on NPRS when sleeping to demonstrate improvement in therapy.    Baseline  04/27/2018: 5/10 NPRS; 06/02/2018: 3/10; 08/17/2018: 8/10; 09/19/18: 3/10; 10/17/2018: 4-5/10    Time  6    Period  Weeks    Status  On-going    Target Date  11/14/18      PT LONG  TERM GOAL #2   Title  Pt will be able to walk 2 miles with no pain to return to recreational activities with friends.    Baseline  04/27/2018: 5/10 NPRS when walking 1 mile; 2/10 with walking 1 mile; 08/17/2018: Able to ambulate 5 miles  with 5/10; able to amb without increase in pain    Time  6    Period  Weeks    Status  Achieved      PT LONG TERM GOAL #3   Title  Patient will demosntrate a worst pain of 1/10 to indicate significant improvement in pain and improvement overall with hip pain    Baseline  3/10; 06/02/2018: 3/10; 08/17/2018: 8/10; 09/19/18: 3/10; 10/17/2018 0/10 (but 10/10 last night)    Time  4    Period  Weeks    Status  Partially Met    Target Date  11/14/18            Plan - 11/09/18 1406    Clinical Impression Statement  Pt continues to demonstrate improved activity tolerance, strength, and pain response, as indicated by her consistent 0/10 pain during the day and improved sleep quality, improved ability to tolerate increased loads of exercise, and improved technique/form requiring a decreased level of UE support and cueing. Pt continues to demonstrate decreased motor control (increased valgus moments, decreased hip abduction activation) with bilateral and unilateral exercises. Pt able to tolerate all exercises without sx exacerbation. Pt continues to demonstrate impaired motor control (SL and DL), impaired hip strength, activity tolerance with prolonged activity, and increased pain at night (though significantly improved compared to initial onset of therapy). Pt will continue to benefit from skilled therapy treatment to return to prior level of function.    Personal Factors and Comorbidities  Age    Examination-Activity Limitations  Squat    Examination-Participation Restrictions  Community Activity    Stability/Clinical Decision Making  Stable/Uncomplicated    Rehab Potential  Good    Clinical Impairments Affecting Rehab Potential  (+) Motivated, enjoys physical activity  (-) Multiple comorbidities, hx of smoking, chronic pain    PT Frequency  2x / week    PT Duration  4 weeks    PT Treatment/Interventions  ADLs/Self Care Home Management;Cryotherapy;Electrical Stimulation;Moist Heat;Traction;Gait training;Stair training;Functional mobility training;Therapeutic activities;Therapeutic exercise;Neuromuscular re-education;Balance training;Patient/family education;Manual techniques;Energy conservation;Spinal Manipulations;Joint Manipulations;Taping    PT Next Visit Plan  Finalize HEP, progress strengthening and pain management    PT Home Exercise Plan  See education section.    Consulted and Agree with Plan of Care  Patient       Patient will benefit from skilled therapeutic intervention in order to improve the following deficits and impairments:  Improper body mechanics, Pain, Increased muscle spasms, Decreased activity tolerance, Decreased endurance, Decreased strength  Visit Diagnosis: 1. Chronic hip pain, bilateral   2. Muscle weakness (generalized)        Problem List Patient Active Problem List   Diagnosis Date Noted   Sprain and strain of hip and thigh 05/27/2018   Age-related osteoporosis without current pathological fracture 03/03/2018   Aortic atherosclerosis ([place]) 01/19/2017   Lymphocytic colitis 12/07/2016   Noninfectious diarrhea    Diarrhea of presumed infectious origin 06/26/2016   Coronary artery disease involving native coronary artery of native heart without angina pectoris 07/22/2015   Personal history of tobacco use, presenting hazards to health 06/26/2015   Rectal polyp    History of smoking 30 or more pack years 05/17/2015   Personal history of nicotine dependence 05/17/2015   Constipation 02/13/2015   Cramp in lower leg 02/12/2015   Difficulty hearing 02/12/2015   Acid reflux 02/12/2015   B12 deficiency 02/12/2015   Abnormal blood sugar 02/12/2015   Hearing loss 02/12/2015   Hypothyroidism 09/10/2014    Insomnia 09/05/2014  Other specified postprocedural states 04/18/2013   S/P breast reconstruction, left 04/18/2013   Personal history of malignant neoplasm of breast 06/20/2012   History of abnormal Pap smear 06/20/2012   Benign hypertension 07/01/2009   Essential (primary) hypertension 07/01/2009   Genital herpes 03/26/2009   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 [place], [address] Phone: [phone]   Fax:  [phone]  Name: JAIMYA FELICIANO MRN: [id number] Date of Birth: June 01, 1945

## 2018-11-23 ENCOUNTER — Ambulatory Visit: Payer: Federal, State, Local not specified - PPO | Attending: Student

## 2018-11-23 ENCOUNTER — Other Ambulatory Visit: Payer: Self-pay

## 2018-11-23 DIAGNOSIS — G8929 Other chronic pain: Secondary | ICD-10-CM | POA: Insufficient documentation

## 2018-11-23 DIAGNOSIS — M6281 Muscle weakness (generalized): Secondary | ICD-10-CM

## 2018-11-23 DIAGNOSIS — M25552 Pain in left hip: Secondary | ICD-10-CM | POA: Diagnosis present

## 2018-11-23 DIAGNOSIS — M25551 Pain in right hip: Secondary | ICD-10-CM | POA: Insufficient documentation

## 2018-11-23 NOTE — Therapy (Signed)
[facility] [address], [place], [address] Phone: [phone]   Fax:  [phone]  Physical Therapy Treatment  Patient Details  Name: Adriana Hicks MRN: [id number] Date of Birth: March 31, 1945 Referring Provider (PT): [name]   Encounter Date: 11/23/2018  PT End of Session - 11/23/18 1405    Visit Number  29    Number of Visits  33    Date for PT Re-Evaluation  11/14/18    PT Start Time  1115    PT Stop Time  1200    PT Time Calculation (min)  45 min    Activity Tolerance  Patient tolerated treatment well;No increased pain    Behavior During Therapy  WFL for tasks assessed/performed       Past Medical History:  Diagnosis Date  . Abnormal Pap smear of cervix 3/15   ASCUS HPV not detected  . ADD (attention deficit disorder)   . ADD (attention deficit disorder)   . Anemia   . Arthritis    fingers  . Arthritis   . Breast cancer ([place])    Left; Age 49  . Bursitis    both hips  . Colitis   . Genital herpes 03/26/2009  . GERD (gastroesophageal reflux disease)   . History of chest pain    Related to reflux  . HSV-2 (herpes simplex virus 2) infection   . Hyperlipidemia   . Hypertension   . Hypothyroidism   . Osteopenia   . Personal history of chemotherapy   . Personal history of tobacco use, presenting hazards to health 06/26/2015  . S/P appendectomy 1981  . Seborrheic dermatitis   . Vertigo    x1 - approx 5 yrs ago    Past Surgical History:  Procedure Laterality Date  . APPENDECTOMY    . AUGMENTATION MAMMAPLASTY  1982  . AUGMENTATION MAMMAPLASTY Bilateral 8850   silicone gel  . BREAST IMPLANT EXCHANGE Bilateral 04/21/2016   Procedure: REMOVAL OF BREAST IMPLANTS WITH IMMEDIATE REPLACMENTOF BREAST IMPLANTS;  Surgeon: Youlanda Roys, MD;  Location: [place];  Service: Plastics;  Laterality: Bilateral;  . BREAST SURGERY  2005   Breast Implant replaceddue to leaking  .  BREAST SURGERY Left 2006   due to asymmetry  . CARDIAC CATHETERIZATION     2011 at [facility] - reflux related  . CERVICAL BIOPSY  W/ LOOP ELECTRODE EXCISION     unsure pathology   . CESAREAN SECTION  1985  . COLONOSCOPY WITH PROPOFOL N/A 06/17/2015   Procedure: COLONOSCOPY WITH PROPOFOL;  Surgeon: Lucilla Lame, MD;  Location: [place];  Service: Endoscopy;  Laterality: N/A;  PT PREFERS EARLY AM  . COLONOSCOPY WITH PROPOFOL N/A 07/16/2016   Procedure: COLONOSCOPY WITH PROPOFOL;  Surgeon: Lucilla Lame, MD;  Location: [place];  Service: Endoscopy;  Laterality: N/A;  requests early as possible  . COSMETIC SURGERY     eyelid surgery  . DILATION AND CURETTAGE OF UTERUS    . ENDOMETRIAL BIOPSY    . eye lid surgery    . MASTECTOMY Left    new implant on the Right  . MASTOPEXY Right 04/21/2016   Procedure: MASTOPEXY;  Surgeon: Youlanda Roys, MD;  Location: [place];  Service: Plastics;  Laterality: Right;  . POLYPECTOMY  06/17/2015   Procedure: POLYPECTOMY;  Surgeon: Lucilla Lame, MD;  Location: [place];  Service: Endoscopy;;  . TONSILLECTOMY  child  . TUBAL  LIGATION      There were no vitals filed for this visit.  Subjective Assessment - 11/23/18 1400    Subjective  Patient continues to have increased pain at night but states it has been happening around 2 nights per week. Patient reports the pain is improving overall.    Pertinent History  Osteoporosis, hypertension, history of breast cancer    Limitations  Walking;Lifting    How long can you sit comfortably?  Unlimited    How long can you stand comfortably?  Unlimited    How long can you walk comfortably?  ~1 mile    Patient Stated Goals  Return to walking, no pain when sleeping    Currently in Pain?  No/denies    Pain Onset  More than a month ago       TREATMENT Therapeutic Exercise to improve hip abduction strengthening and motor control for bilateral hip/knees to  reduce knee valgus stressors.   Hip Lateral Swings x 1 min each, B Hip Forward/Rearward Swings x 1 min each, B  Hip circles with blue Theraband 2 x 30sec  Deep lunges onto the pad - x 20 SL Rear-foot elevated split squat 2x10 each leg with UE support SL Dead lift with hip rotations - x 20    Performed exercises to improve hip abduction strength and knee motor control in SL.    At the end of the session, pt continues to report 0/10 pain and moderate soreness.  PT Education - 11/23/18 1402    Education Details  form/technique with exercise; HEP: hip circles with blue theraband    Person(s) Educated  Patient    Methods  Explanation;Demonstration    Comprehension  Verbalized understanding;Returned demonstration       PT Short Term Goals - 09/19/18 1312      PT SHORT TERM GOAL #1   Title  Pt will have 100% compliance with HEP to improve hip strength and walking tolerance    Baseline  04/27/2018: Side-lying hip abduction and wlkaing 3/4 mile every other day; 09/19/18: independent with HEP    Time  2    Period  Weeks    Status  Achieved    Target Date  05/11/18        PT Long Term Goals - 10/17/18 1413      PT LONG TERM GOAL #1   Title  Pt will have a decrease of at least 3 points on NPRS when sleeping to demonstrate improvement in therapy.    Baseline  04/27/2018: 5/10 NPRS; 06/02/2018: 3/10; 08/17/2018: 8/10; 09/19/18: 3/10; 10/17/2018: 4-5/10    Time  6    Period  Weeks    Status  On-going    Target Date  11/14/18      PT LONG TERM GOAL #2   Title  Pt will be able to walk 2 miles with no pain to return to recreational activities with friends.    Baseline  04/27/2018: 5/10 NPRS when walking 1 mile; 2/10 with walking 1 mile; 08/17/2018: Able to ambulate 5 miles with 5/10; able to amb without increase in pain    Time  6    Period  Weeks    Status  Achieved      PT LONG TERM GOAL #3   Title  Patient will demosntrate a worst pain of 1/10 to indicate significant improvement in pain and  improvement overall with hip pain    Baseline  3/10; 06/02/2018: 3/10; 08/17/2018: 8/10; 09/19/18: 3/10; 10/17/2018 0/10 (but  10/10 last night)    Time  4    Period  Weeks    Status  Partially Met    Target Date  11/14/18            Plan - 11/23/18 1406    Clinical Impression Statement  Patient is making progress towards long term goals with overall less pain and having less episodes of pain compared to previous sessions. Patient is improving overall but conitnues to have episodes of increased pain when lying at night. Updated HEP and focused on improving patient's pain. Patient demonstrates continuous increase in pain at night and patient will benefit from further skilled therapy to return to prior level of function.    Personal Factors and Comorbidities  Age    Examination-Activity Limitations  Squat    Examination-Participation Restrictions  Community Activity    Stability/Clinical Decision Making  Stable/Uncomplicated    Rehab Potential  Good    Clinical Impairments Affecting Rehab Potential  (+) Motivated, enjoys physical activity (-) Multiple comorbidities, hx of smoking, chronic pain    PT Frequency  2x / week    PT Duration  4 weeks    PT Treatment/Interventions  ADLs/Self Care Home Management;Cryotherapy;Electrical Stimulation;Moist Heat;Traction;Gait training;Stair training;Functional mobility training;Therapeutic activities;Therapeutic exercise;Neuromuscular re-education;Balance training;Patient/family education;Manual techniques;Energy conservation;Spinal Manipulations;Joint Manipulations;Taping    PT Next Visit Plan  Finalize HEP, progress strengthening and pain management    PT Home Exercise Plan  See education section.    Consulted and Agree with Plan of Care  Patient       Patient will benefit from skilled therapeutic intervention in order to improve the following deficits and impairments:  Improper body mechanics, Pain, Increased muscle spasms, Decreased activity  tolerance, Decreased endurance, Decreased strength  Visit Diagnosis: Chronic hip pain, bilateral  Muscle weakness (generalized)  Pain in left hip     Problem List Patient Active Problem List   Diagnosis Date Noted  . Sprain and strain of hip and thigh 05/27/2018  . Age-related osteoporosis without current pathological fracture 03/03/2018  . Aortic atherosclerosis ([place]) 01/19/2017  . Lymphocytic colitis 12/07/2016  . Noninfectious diarrhea   . Diarrhea of presumed infectious origin 06/26/2016  . Coronary artery disease involving native coronary artery of native heart without angina pectoris 07/22/2015  . Personal history of tobacco use, presenting hazards to health 06/26/2015  . Rectal polyp   . History of smoking 30 or more pack years 05/17/2015  . Personal history of nicotine dependence 05/17/2015  . Constipation 02/13/2015  . Cramp in lower leg 02/12/2015  . Difficulty hearing 02/12/2015  . Acid reflux 02/12/2015  . B12 deficiency 02/12/2015  . Abnormal blood sugar 02/12/2015  . Hearing loss 02/12/2015  . Hypothyroidism 09/10/2014  . Insomnia 09/05/2014  . Other specified postprocedural states 04/18/2013  . S/P breast reconstruction, left 04/18/2013  . Personal history of malignant neoplasm of breast 06/20/2012  . History of abnormal Pap smear 06/20/2012  . Benign hypertension 07/01/2009  . Essential (primary) hypertension 07/01/2009  . Genital herpes 03/26/2009  . 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 Hypercholesterolemia without hypertriglyceridemia 02/21/2009  . Allergic rhinitis 02/21/2009    Blythe Stanford, PT DPT 11/23/2018, 2:11 PM  [facility] [address], [place], [address] Phone: [phone]   Fax:  [phone]  Name: Adriana Hicks MRN: [id number] Date of Birth: 1945-08-31

## 2018-11-30 ENCOUNTER — Ambulatory Visit: Payer: Federal, State, Local not specified - PPO

## 2018-12-07 ENCOUNTER — Ambulatory Visit: Payer: Federal, State, Local not specified - PPO

## 2019-02-02 ENCOUNTER — Ambulatory Visit: Payer: Federal, State, Local not specified - PPO

## 2019-03-30 ENCOUNTER — Other Ambulatory Visit: Payer: Self-pay

## 2019-03-30 ENCOUNTER — Ambulatory Visit
Admission: RE | Admit: 2019-03-30 | Discharge: 2019-03-30 | Disposition: A | Discharge status: Home / Self Care | Payer: Federal, State, Local not specified - PPO | Source: Ambulatory Visit | Attending: Certified Nurse Midwife | Admitting: Certified Nurse Midwife

## 2019-03-30 DIAGNOSIS — Z1231 Encounter for screening mammogram for malignant neoplasm of breast: Secondary | ICD-10-CM

## 2019-05-25 ENCOUNTER — Other Ambulatory Visit: Payer: Self-pay | Admitting: Oncology

## 2019-05-25 DIAGNOSIS — R911 Solitary pulmonary nodule: Secondary | ICD-10-CM

## 2019-05-25 NOTE — Progress Notes (Signed)
  Pulmonary Nodule Clinic Telephone Note [place]   Received referral from our low-dose CT screening program.  She currently no longer meets criteria.  HPI: Patient has been followed previously by our low-dose CT screening program and receives annual low-dose CT scans.  She no longer meets criteria due to quit date being greater than 15 years ago.  Has history of pulmonary nodules so referred to our clinic for further surveillance.  Review and Recommendations: I personally reviewed all patient's previous imaging including last low-dose CT scan which showed lung RADS 2.  Several subcentimeter lung nodules identified.  The largest is in the right middle lobe measuring 4.8 mm.  No new lung nodules identified.  This was last on 01/13/2018.  I recommend follow-up with noncontrasted CT scan of the chest in the next 1 to 2 weeks.  Social History: Patient is a former smoker.  Patient quit in 2005 and has a 35-pack-year history.  High risk factors include: History of heavy smoking, exposure to asbestos, radium or uranium, personal family history of lung cancer, older age, sex (females greater than males), race (black and native [place] greater than weight), marginal speculation, upper lobe location, multiplicity (less than 5 nodules increases risk for malignancy) and emphysema and/or pulmonary fibrosis.   This recommendation follows the consensus statement: Guidelines for Management of Incidental Pulmonary Nodules Detected on CT Images: From the Fleischner Society 2017; Radiology 2017; 284:228-243.    I have placed order for CT scan without contrast to be completed in the next 1 to 2 weeks.  Disposition: Order placed for repeat CT chest. Will notify [facility] in scheduling. Hayley Rhode to call patient with appointment date and time. Return to pulmonary nodule clinic a few days after his repeat imaging to discuss results and plan moving forward.  Faythe Casa, NP 05/25/2019  11:30 AM

## 2019-05-26 ENCOUNTER — Encounter: Payer: Self-pay | Admitting: *Deleted

## 2019-06-07 ENCOUNTER — Ambulatory Visit
Admission: RE | Admit: 2019-06-07 | Discharge: 2019-06-07 | Disposition: A | Discharge status: Home / Self Care | Payer: Federal, State, Local not specified - PPO | Source: Ambulatory Visit | Attending: Oncology | Admitting: Oncology

## 2019-06-07 ENCOUNTER — Other Ambulatory Visit: Payer: Self-pay

## 2019-06-07 DIAGNOSIS — R911 Solitary pulmonary nodule: Secondary | ICD-10-CM

## 2019-06-08 ENCOUNTER — Inpatient Hospital Stay: Payer: Federal, State, Local not specified - PPO | Attending: Oncology | Admitting: Oncology

## 2019-06-08 DIAGNOSIS — R911 Solitary pulmonary nodule: Secondary | ICD-10-CM | POA: Diagnosis not present

## 2019-06-08 NOTE — Progress Notes (Signed)
Pulmonary Nodule Clinic Consult note [facility]  [phone] [phone]  Patient Care Team: Adin Hector, MD as PCP - General (Internal Medicine)   Name of the patient: Adriana Hicks  [id number]  08-08-1945   Date of visit: 06/08/2019   Diagnosis- Lung Nodule  Virtual Visit via Telephone Note   I connected with Adriana Hicks on 06/08/19 at 1:30pm by telephone visit and verified that I am speaking with the correct person using two identifiers.   I discussed the limitations, risks, security and privacy concerns of performing an evaluation and management service by telemedicine and the availability of in-person appointments. I also discussed with the patient that there may be a patient responsible charge related to this service. The patient expressed understanding and agreed to proceed.   Other persons participating in the visit and their role in the encounter:   Patient's location: Home  Provider's location: Office  Chief complaint/ Reason for visit- Pulmonary Nodule Clinic Initial Visit  Past Medical History:  Patient was previously followed in our low-dose CT screening program.  She currently no longer meets criteria.  She was referred to the pulmonary nodule clinic for a right middle lobe nodule measuring 4.8 mm.  Last imaging was completed on 01/13/2018.    Interval history-patient presents to pulmonary nodule clinic to review recent CT scan.  Adriana Hicks is a 74 year old female who was originally evaluated in our low-dose CT screening program.  Patient quit smoking in 2005 and currently does not meet criteria.  Patient smoked 1 to 2 packs of cigarettes per day for greater than 30 years.   She is retired. She worked for the [employer] for over 25 years and retired a few years ago.  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  She denies any known exposure to occupational chemicals that are known to cause  cancer.  She has personal history of left breast cancer at age 58.  Family history of cancer includes her mom with breast cancer and brother who is currently being worked up for pancreatic mass and several lung nodules; worrisome for lung cancer.  She currently is retired and doing well.  She lives at home with her husband.  She denies any problems at this time.  She has minimal shortness of breath with exertion.  She has never formally been diagnosed with COPD or emphysema.  She was told once that she had a touch of COPD.  She is currently not taking any medications for this.  She denies any fevers or illnesses, weight loss, chest pain, nausea, vomiting, constipation or diarrhea.  ECOG FS:0 - Asymptomatic  Review of systems- Review of Systems  Constitutional: Negative.  Negative for chills, fever, malaise/fatigue and weight loss.  HENT: Negative for congestion, ear pain and tinnitus.   Eyes: Negative.  Negative for blurred vision and double vision.  Respiratory: Positive for shortness of breath. Negative for cough and sputum production.   Cardiovascular: Negative.  Negative for chest pain, palpitations and leg swelling.  Gastrointestinal: Negative.  Negative for abdominal pain, constipation, diarrhea, nausea and vomiting.  Genitourinary: Negative for dysuria, frequency and urgency.  Musculoskeletal: Negative for back pain and falls.  Skin: Negative.  Negative for rash.  Neurological: Negative.  Negative for weakness and headaches.  Endo/Heme/Allergies: Negative.  Does not bruise/bleed easily.  Psychiatric/Behavioral: Negative.  Negative for depression. The patient is not nervous/anxious and does not have insomnia.      Allergies  Allergen Reactions  . Sulfamethoxazole Other (See Comments)    Vaginal itching      Past Medical History:  Diagnosis Date  . Abnormal Pap smear of cervix 3/15   ASCUS HPV not detected  . ADD (attention deficit disorder)   . ADD (attention deficit  disorder)   . Anemia   . Arthritis    fingers  . Arthritis   . Breast cancer ([place])    Left; Age 84  . Bursitis    both hips  . Colitis   . Genital herpes 03/26/2009  . GERD (gastroesophageal reflux disease)   . History of chest pain    Related to reflux  . HSV-2 (herpes simplex virus 2) infection   . Hyperlipidemia   . Hypertension   . Hypothyroidism   . Osteopenia   . Personal history of chemotherapy   . Personal history of tobacco use, presenting hazards to health 06/26/2015  . S/P appendectomy 1981  . Seborrheic dermatitis   . Vertigo    x1 - approx 5 yrs ago     Past Surgical History:  Procedure Laterality Date  . APPENDECTOMY    . AUGMENTATION MAMMAPLASTY  1982  . AUGMENTATION MAMMAPLASTY Bilateral 99991111   silicone gel  . BREAST IMPLANT EXCHANGE Bilateral 04/21/2016   Procedure: REMOVAL OF BREAST IMPLANTS WITH IMMEDIATE REPLACMENTOF BREAST IMPLANTS;  Surgeon: Youlanda Roys, MD;  Location: [place];  Service: Plastics;  Laterality: Bilateral;  . BREAST SURGERY  2005   Breast Implant replaceddue to leaking  . BREAST SURGERY Left 2006   due to asymmetry  . CARDIAC CATHETERIZATION     2011 at [facility] - reflux related  . CERVICAL BIOPSY  W/ LOOP ELECTRODE EXCISION     unsure pathology   . CESAREAN SECTION  1985  . COLONOSCOPY WITH PROPOFOL N/A 06/17/2015   Procedure: COLONOSCOPY WITH PROPOFOL;  Surgeon: Lucilla Lame, MD;  Location: [place];  Service: Endoscopy;  Laterality: N/A;  PT PREFERS EARLY AM  . COLONOSCOPY WITH PROPOFOL N/A 07/16/2016   Procedure: COLONOSCOPY WITH PROPOFOL;  Surgeon: Lucilla Lame, MD;  Location: [place];  Service: Endoscopy;  Laterality: N/A;  requests early as possible  . COSMETIC SURGERY     eyelid surgery  . DILATION AND CURETTAGE OF UTERUS    . ENDOMETRIAL BIOPSY    . eye lid surgery    . MASTECTOMY Left    new implant on the Right  . MASTOPEXY Right 04/21/2016   Procedure: MASTOPEXY;   Surgeon: Youlanda Roys, MD;  Location: [place];  Service: Plastics;  Laterality: Right;  . POLYPECTOMY  06/17/2015   Procedure: POLYPECTOMY;  Surgeon: Lucilla Lame, MD;  Location: [place];  Service: Endoscopy;;  . TONSILLECTOMY  child  . TUBAL LIGATION      Social History   Socioeconomic History  . Marital status: Married    Spouse name: Not on file  . Number of children: Not on file  . Years of education: Not on file  . Highest education level: Not on file  Occupational History  . Not on file  Tobacco Use  . Smoking status: Former Smoker    Packs/day: 1.00    Years: 35.00    Pack years: 35.00    Types: Cigarettes    Quit date: 03/22/2004    Years since quitting: 15.2  . Smokeless tobacco: Never Used  Substance and Sexual Activity  . Alcohol use: Yes  Alcohol/week: 7.0 standard drinks    Types: 7 Standard drinks or equivalent per week  . Drug use: No  . Sexual activity: Yes    Partners: Male    Birth control/protection: Surgical    Comment: btl  Other Topics Concern  . Not on file  Social History Narrative  . Not on file   Social Determinants of Health   Financial Resource Strain:   . Difficulty of Paying Living Expenses:   Food Insecurity:   . Worried About Charity fundraiser in the Last Year:   . Arboriculturist in the Last Year:   Transportation Needs:   . Film/video editor (Medical):   Marland Kitchen Lack of Transportation (Non-Medical):   Physical Activity:   . Days of Exercise per Week:   . Minutes of Exercise per Session:   Stress:   . Feeling of Stress :   Social Connections:   . Frequency of Communication with Friends and Family:   . Frequency of Social Gatherings with Friends and Family:   . Attends Religious Services:   . Active Member of Clubs or Organizations:   . Attends Archivist Meetings:   Marland Kitchen Marital Status:   Intimate Partner Violence:   . Fear of Current or Ex-Partner:   . Emotionally Abused:    Marland Kitchen Physically Abused:   . Sexually Abused:     Family History  Problem Relation Age of Onset  . Breast cancer Mother 18  . Heart disease Father   . Skin cancer Brother   . Crohn's disease Brother      Current Outpatient Medications:  .  amphetamine-dextroamphetamine (ADDERALL) 20 MG tablet, Take 20 mg by mouth daily., Disp: , Rfl:  .  Ascorbic Acid (VITAMIN C PO), Take by mouth., Disp: , Rfl:  .  azelastine (ASTELIN) 0.1 % nasal spray, Place into both nostrils 2 (two) times daily as needed for rhinitis. Use in each nostril as directed, Disp: , Rfl:  .  celecoxib (CELEBREX) 200 MG capsule, Take 200 mg by mouth daily., Disp: , Rfl:  .  cholecalciferol (VITAMIN D) 1000 UNITS tablet, Take 1,000 Units by mouth daily., Disp: , Rfl:  .  Clobetasol Propionate (CLODAN) 0.05 % shampoo, Apply topically daily., Disp: , Rfl:  .  cyanocobalamin (,VITAMIN B-12,) 1000 MCG/ML injection, USE 1 ML PER INJECTION, IM WEEKLY, Disp: 10 mL, Rfl: 3 .  doxepin (SINEQUAN) 10 MG capsule, TAKE 1 CAPSULE (10 MG TOTAL) BY MOUTH AT BEDTIME., Disp: 90 capsule, Rfl: 1 .  Estradiol (YUVAFEM) 10 MCG TABS vaginal tablet, One tablet vaginally twice weekly, Disp: 24 tablet, Rfl: 3 .  ferrous sulfate 324 (65 FE) MG TBEC, Take by mouth as needed. Reported on 06/17/2015, Disp: , Rfl:  .  hydrocortisone 2.5 % cream, , Disp: , Rfl:  .  levothyroxine (SYNTHROID) 75 MCG tablet, Take 75 mcg by mouth daily before breakfast., Disp: , Rfl:  .  losartan (COZAAR) 100 MG tablet, Take 100 mg by mouth daily., Disp: , Rfl:  .  Melatonin 5 MG TABS, Take by mouth at bedtime. , Disp: , Rfl:  .  Probiotic Product (PROBIOTIC PO), Take by mouth daily., Disp: , Rfl:  .  S-Adenosylmethionine (SAM-E PO), Take by mouth daily., Disp: , Rfl:  .  UNABLE TO FIND, Bio freeze, Disp: , Rfl:  .  valACYclovir (VALTREX) 500 MG tablet, Take one tablet twice daily at onset of outbreak x 3 days, Disp: 45 tablet, Rfl: 12  Physical  exam: There were no vitals  filed for this visit.  Limited d/t telephone visit  CMP Latest Ref Rng & Units 04/16/2016  Glucose 65 - 99 mg/dL 100(H)  BUN 6 - 20 mg/dL 13  Creatinine 0.44 - 1.00 mg/dL 0.61  Sodium 135 - 145 mmol/L 138  Potassium 3.5 - 5.1 mmol/L 3.9  Chloride 101 - 111 mmol/L 104  CO2 22 - 32 mmol/L 26  Calcium 8.9 - 10.3 mg/dL 9.8  Total Protein 6.0 - 8.5 g/dL -  Total Bilirubin 0.0 - 1.2 mg/dL -  Alkaline Phos 39 - 117 IU/L -  AST 0 - 40 IU/L -  ALT 0 - 32 IU/L -   CBC Latest Ref Rng & Units 04/21/2016  WBC 3.8 - 10.8 K/uL -  Hemoglobin 12.0 - 15.0 g/dL 11.8(L)  Hematocrit 35.0 - 45.0 % -  Platelets 140 - 400 K/uL -    No images are attached to the encounter.  CT Chest Wo Contrast  Result Date: 06/07/2019 CLINICAL DATA:  PT states she is here for a routine followo up for her lung nodule. No chest pains does have some SOB.Hx of breast cancer had myectomy. EXAM: CT CHEST WITHOUT CONTRAST TECHNIQUE: Multidetector CT imaging of the chest was performed following the standard protocol without IV contrast. COMPARISON:  Prior chest CTs, most recent dated 01/13/2018, oldest dated 06/26/2015. FINDINGS: Cardiovascular: Heart is normal in size and configuration. No pericardial effusion. Three-vessel coronary artery calcifications. Great vessels are normal in caliber. Mild aortic atherosclerotic calcifications. Mediastinum/Nodes: No mediastinal or hilar masses. No evidence of adenopathy. Trachea and esophagus are unremarkable. Small hiatal hernia. Lungs/Pleura: 5 mm nodule, anterior base of the right middle lobe, stable compared to 06/26/2015. No other nodules. No evidence of pulmonary edema. No lung consolidation to suggest pneumonia. Mild centrilobular emphysema. No pleural effusion or pneumothorax. Upper Abdomen: No acute or significant abnormality. Musculoskeletal: No fracture or acute finding. No osteoblastic or osteolytic lesions. IMPRESSION: 1. No acute findings. 2. 5 mm nodule, base of the right middle  lobe, stable and benign. 3. No new or suspicious lung nodules. 4. Mild centrilobular emphysema and aortic atherosclerosis. Three-vessel coronary artery calcifications. Aortic Atherosclerosis (ICD10-I70.0) and Emphysema (ICD10-J43.9). Electronically Signed   By: Lajean Manes M.D.   On: 06/07/2019 12:06   Assessment and plan- Patient is a 74 y.o. female who presents to pulmonary nodule clinic for follow-up of incidental lung nodules.  A telephone visit was conducted to review most recent CT scan results.    CT chest without contrast from today revealed a 5 mm nodule in the right middle lobe that has been stable since 06/26/2015.  Calculating malignancy probability of a pulmonary nodule: Risk factors include: 1.  Age. 2.  Cancer history. 3.  Diameter of pulmonary nodule and mm 4.  Location 5.  Smoking history 6.  Spiculation present   Based on risk factors, this patient is low risk for the development of lung cancer.  Reviewed with patient that her age and smoking history does increase her risk for lung malignancy but given she has no additional nodules and the lung nodule in her right middle lobe has been stable for greater than 4 years makes this less concerning.  I do not believe she needs additional follow-up in our clinic.   Patient in agreement.   During our visit, we discussed pulmonary nodules are a common incidental finding and are often how lung cancer is discovered.  Lung cancer survival is directly related to the stage  at diagnosis.  We discussed that nodules can vary in presentation from solitary pulmonary nodules to masses, 2 groundglass opacities and multiple nodules.  Pulmonary nodules in the majority of cases are benign but the probability of these becoming malignant cannot be undermined.  Early identification of malignant nodules could lead to early diagnosis and increased survival.   We discussed the probability of pulmonary nodules becoming malignant increase with age, pack years of  tobacco use, size/characteristics of the nodule and location; with upper lobe involvement being most worrisome.   We discussed the goal of our clinic is to thoroughly evaluate each nodule, developed a comprehensive, individualized plan of care utilizing the most advanced technology and significantly reduce the time from detection to treatment.  A dedicated pulmonary nodule clinic has proven to indeed expedite the detection and treatment of lung cancer.   Patient education in fact sheet provided along with most recent CT scans.  Disposition: No additional follow-up needed in the Pulmonary Lung Nodule Program.    Visit Diagnosis 1. Lung nodule     Patient expressed understanding and was in agreement with this plan. She also understands that She can call clinic at any time with any questions, concerns, or complaints.   Greater than 50% was spent in counseling and coordination of care with this patient including but not limited to discussion of the relevant topics above (See A&P) including, but not limited to diagnosis and management of acute and chronic medical conditions.   Thank you for allowing me to participate in the care of this very pleasant patient.    Jacquelin Hawking, NP [place] [facility] Cell - [phone] Pager- [phone] 06/08/2019 3:32 PM   CC: Dr. Caryl Comes

## 2019-06-13 ENCOUNTER — Encounter: Payer: Self-pay | Admitting: Certified Nurse Midwife

## 2019-09-19 ENCOUNTER — Ambulatory Visit: Payer: Federal, State, Local not specified - PPO | Admitting: Certified Nurse Midwife

## 2019-09-28 NOTE — Progress Notes (Signed)
74 y.o. G19P1001 Married Caucasian female here for annual exam.    Struggling with her arthritis.  She is doing physical therapy for hips and neck.  Now started a plant based diet.  No dairy or meat.   B12 injection once a month.  Taking vit C and D.  Using Vagifem twice a week.  States she was ok'd by her oncologist in [place] in past.   Brother with stage IV melanoma.   Retired.  Likes gardening.   Had her Covid vaccine. Second vaccine done 05/14/19.  Moderna.   PCP:  Ramonita Lab, MD  Patient's last menstrual period was 03/23/1994.           Sexually active: Yes.    The current method of family planning is tubal ligation.    Exercising: Yes.    water aerobics 3x/week Smoker:  no  Health Maintenance: Pap: 09-13-18 Neg:Neg HR HPV, 08-19-17 neg, 08-18-16 ASCUS:Neg HR HPV, History of abnormal Pap:  Yes, Hx of cryotherapy to cervix in her 20's MMG:  04-09-19 3D/Lt.mastectomy/Neg/density B/BiRads1  Colonoscopy: 2018 inflammatory changes only BMD: 2019 Result :Osteoporosis--PCP--per patient TDaP: 11-27-10 Gardasil:   no HIV: 08-13-15 NR Hep C: 08-13-15 Neg Screening Labs:  PCP   reports that she quit smoking about 15 years ago. Her smoking use included cigarettes. She has a 35.00 pack-year smoking history. She has never used smokeless tobacco. She reports current alcohol use of about 7.0 standard drinks of alcohol per week. She reports that she does not use drugs.  Past Medical History:  Diagnosis Date  . Abnormal Pap smear of cervix 2014 and 2018   ASCUS HPV not detected--hx of cryotherapy to cervix in her 20's  . ADD (attention deficit disorder)   . ADD (attention deficit disorder)   . Anemia   . Arthritis    fingers  . Arthritis   . Breast cancer ([place])    Left; Age 70  . Bursitis    both hips  . Colitis   . Genital herpes 03/26/2009  . GERD (gastroesophageal reflux disease)   . History of chest pain    Related to reflux  . HSV-2 (herpes simplex virus 2) infection   .  Hyperlipidemia   . Hypertension   . Hypothyroidism   . Osteopenia   . Personal history of chemotherapy   . Personal history of tobacco use, presenting hazards to health 06/26/2015  . S/P appendectomy 1981  . Seborrheic dermatitis   . Vertigo    x1 - approx 5 yrs ago    Past Surgical History:  Procedure Laterality Date  . APPENDECTOMY    . AUGMENTATION MAMMAPLASTY  1982  . AUGMENTATION MAMMAPLASTY Bilateral 8502   silicone gel  . BREAST IMPLANT EXCHANGE Bilateral 04/21/2016   Procedure: REMOVAL OF BREAST IMPLANTS WITH IMMEDIATE REPLACMENTOF BREAST IMPLANTS;  Surgeon: Youlanda Roys, MD;  Location: [place];  Service: Plastics;  Laterality: Bilateral;  . BREAST SURGERY  2005   Breast Implant replaceddue to leaking  . BREAST SURGERY Left 2006   due to asymmetry  . CARDIAC CATHETERIZATION     2011 at [facility] - reflux related  . CERVICAL BIOPSY  W/ LOOP ELECTRODE EXCISION     unsure pathology   . CESAREAN SECTION  1985  . COLONOSCOPY WITH PROPOFOL N/A 06/17/2015   Procedure: COLONOSCOPY WITH PROPOFOL;  Surgeon: Lucilla Lame, MD;  Location: [place];  Service: Endoscopy;  Laterality: N/A;  PT PREFERS EARLY AM  . COLONOSCOPY WITH  PROPOFOL N/A 07/16/2016   Procedure: COLONOSCOPY WITH PROPOFOL;  Surgeon: Lucilla Lame, MD;  Location: [place];  Service: Endoscopy;  Laterality: N/A;  requests early as possible  . COSMETIC SURGERY     eyelid surgery  . DILATION AND CURETTAGE OF UTERUS    . ENDOMETRIAL BIOPSY    . eye lid surgery    . MASTECTOMY Left    new implant on the Right  . MASTOPEXY Right 04/21/2016   Procedure: MASTOPEXY;  Surgeon: Youlanda Roys, MD;  Location: [place];  Service: Plastics;  Laterality: Right;  . POLYPECTOMY  06/17/2015   Procedure: POLYPECTOMY;  Surgeon: Lucilla Lame, MD;  Location: [place];  Service: Endoscopy;;  . TONSILLECTOMY  child  . TUBAL LIGATION      Current  Outpatient Medications  Medication Sig Dispense Refill  . amphetamine-dextroamphetamine (ADDERALL) 20 MG tablet Take 20 mg by mouth daily.    . Ascorbic Acid (VITAMIN C PO) Take by mouth.    Marland Kitchen azelastine (ASTELIN) 0.1 % nasal spray Place into both nostrils 2 (two) times daily as needed for rhinitis. Use in each nostril as directed    . cholecalciferol (VITAMIN D) 1000 UNITS tablet Take 1,000 Units by mouth daily.    . Clobetasol Propionate (CLODAN) 0.05 % shampoo Apply topically daily.    . cyanocobalamin (,VITAMIN B-12,) 1000 MCG/ML injection USE 1 ML PER INJECTION, IM WEEKLY 10 mL 3  . doxepin (SINEQUAN) 10 MG capsule TAKE 1 CAPSULE (10 MG TOTAL) BY MOUTH AT BEDTIME. 90 capsule 1  . Estradiol (YUVAFEM) 10 MCG TABS vaginal tablet One tablet vaginally twice weekly 24 tablet 3  . ketoconazole (NIZORAL) 2 % shampoo Apply topically 3 (three) times a week.    . levothyroxine (SYNTHROID) 75 MCG tablet Take 75 mcg by mouth daily before breakfast.    . losartan (COZAAR) 100 MG tablet Take 100 mg by mouth daily.    . Melatonin 5 MG TABS Take by mouth at bedtime.     . NON FORMULARY CBD lotion prn on neck    . OVER THE COUNTER MEDICATION [place] Bone Strength Takes: 2 tablets qhs    . S-Adenosylmethionine (SAM-E PO) Take by mouth daily.    Marland Kitchen UNABLE TO FIND Bio freeze    . valACYclovir (VALTREX) 500 MG tablet Take one tablet twice daily at onset of outbreak x 3 days 45 tablet 12   No current facility-administered medications for this visit.    Family History  Problem Relation Age of Onset  . Breast cancer Mother 41  . Heart disease Father   . Skin cancer Brother   . Crohn's disease Brother     Review of Systems  All other systems reviewed and are negative.   Exam:   BP 126/68   Pulse 76   Resp 18   Ht 5\' 2"  (1.575 m)   Wt 138 lb 9.6 oz (62.9 kg)   LMP 03/23/1994   BMI 25.35 kg/m     General appearance: alert, cooperative and appears stated age Head: normocephalic, without  obvious abnormality, atraumatic Neck: no adenopathy, supple, symmetrical, trachea midline and thyroid normal to inspection and palpation Lungs: clear to auscultation bilaterally Breasts: bilateral implants, left breast absent, no masses or tenderness, No nipple retraction or dimpling, No nipple discharge or bleeding, No axillary adenopathy on right.  Left axillary mass 1 cm and tender.  Heart: regular rate and rhythm Abdomen: soft, non-tender; no masses, no organomegaly Extremities: extremities  normal, atraumatic, no cyanosis or edema Skin: skin color, texture, turgor normal. No rashes or lesions Lymph nodes: cervical, supraclavicular, and axillary nodes normal. Neurologic: grossly normal  Pelvic: External genitalia:  no lesions              No abnormal inguinal nodes palpated.              Urethra:  normal appearing urethra with no masses, tenderness or lesions              Bartholins and Skenes: normal                 Vagina: normal appearing vagina with normal color and discharge, no lesions              Cervix: no lesions.  Consistent with conization.               Pap taken: No. Bimanual Exam:  Uterus:  normal size, contour, position, consistency, mobility, non-tender              Adnexa: no mass, fullness, tenderness              Rectal exam: Yes.  .  Confirms.              Anus:  normal sphincter tone, no lesions  Chaperone was present for exam.  Assessment:   Well woman visit with normal exam. Hx LEEP in early 1990s.   Uncertain pathology.  Hx cryotherapy prior to conization.  ASCUS pap 2014 and 2018. Hx left breast cancer.   Left mastectomy.  Right mastopexy.  Bilateral breast implants.  Left axillary mass.  Use of Vagifem for atrophy.  Osteoporosis.  PCP managing.  HSV.  Elevated A1C.   Plan: Mammogram screening discussed. Will schedule a left axillary [place]. Self breast awareness reviewed. Pap and HR HPV as above. Guidelines for Calcium, Vitamin D, regular exercise  program including cardiovascular and weight bearing exercise. After discussion about potential increased risk of breast cancer with Vagifem use, she will stop this and try vit E vaginal suppositories instead.  Follow up annually and prn.  After visit summary provided.

## 2019-10-02 ENCOUNTER — Telehealth: Payer: Self-pay | Admitting: Obstetrics and Gynecology

## 2019-10-02 ENCOUNTER — Other Ambulatory Visit: Payer: Self-pay

## 2019-10-02 ENCOUNTER — Ambulatory Visit (INDEPENDENT_AMBULATORY_CARE_PROVIDER_SITE_OTHER): Payer: Federal, State, Local not specified - PPO | Admitting: Obstetrics and Gynecology

## 2019-10-02 ENCOUNTER — Other Ambulatory Visit: Payer: Self-pay | Admitting: Obstetrics and Gynecology

## 2019-10-02 ENCOUNTER — Encounter: Payer: Self-pay | Admitting: Obstetrics and Gynecology

## 2019-10-02 VITALS — BP 126/68 | HR 76 | Resp 18 | Ht 62.0 in | Wt 138.6 lb

## 2019-10-02 DIAGNOSIS — R2232 Localized swelling, mass and lump, left upper limb: Secondary | ICD-10-CM | POA: Diagnosis not present

## 2019-10-02 DIAGNOSIS — A6009 Herpesviral infection of other urogenital tract: Secondary | ICD-10-CM

## 2019-10-02 DIAGNOSIS — Z01419 Encounter for gynecological examination (general) (routine) without abnormal findings: Secondary | ICD-10-CM

## 2019-10-02 MED ORDER — VALACYCLOVIR HCL 500 MG PO TABS: ORAL_TABLET | ORAL | 2 refills | Status: DC

## 2019-10-02 NOTE — Telephone Encounter (Signed)
Spoke with Langley Gauss at [facility]. Patient scheduled for left axillary [place] on 10/03/19 at 9:30am, arrive at 9:10am.   Patient notified of appt, patient is agreeable to date and time.   Placed in [place] hold.   Routing to provider for final review. Patient is agreeable to disposition. Will close encounter.

## 2019-10-02 NOTE — Telephone Encounter (Signed)
Please schedule an axillary [place] for patient at the [facility].   She has a hx of left breast cancer and had a mastectomy with reconstruction.   I feel a 1 cm left axillary mass, which is tender.

## 2019-10-02 NOTE — Patient Instructions (Signed)

## 2019-10-03 ENCOUNTER — Ambulatory Visit
Admission: RE | Admit: 2019-10-03 | Discharge: 2019-10-03 | Disposition: A | Discharge status: Home / Self Care | Payer: Federal, State, Local not specified - PPO | Source: Ambulatory Visit | Attending: Obstetrics and Gynecology | Admitting: Obstetrics and Gynecology

## 2019-10-03 ENCOUNTER — Other Ambulatory Visit: Payer: Self-pay | Admitting: Obstetrics and Gynecology

## 2019-10-03 DIAGNOSIS — R599 Enlarged lymph nodes, unspecified: Secondary | ICD-10-CM

## 2019-10-03 DIAGNOSIS — R2232 Localized swelling, mass and lump, left upper limb: Secondary | ICD-10-CM

## 2019-10-04 ENCOUNTER — Ambulatory Visit
Admission: RE | Admit: 2019-10-04 | Discharge: 2019-10-04 | Disposition: A | Discharge status: Home / Self Care | Payer: Federal, State, Local not specified - PPO | Source: Ambulatory Visit | Attending: Obstetrics and Gynecology | Admitting: Obstetrics and Gynecology

## 2019-10-04 ENCOUNTER — Other Ambulatory Visit: Payer: Self-pay

## 2019-10-04 ENCOUNTER — Other Ambulatory Visit (HOSPITAL_COMMUNITY)
Admission: RE | Admit: 2019-10-04 | Discharge: 2019-10-04 | Disposition: A | Discharge status: Home / Self Care | Payer: Federal, State, Local not specified - PPO | Source: Ambulatory Visit | Attending: Radiology | Admitting: Radiology

## 2019-10-04 DIAGNOSIS — R599 Enlarged lymph nodes, unspecified: Secondary | ICD-10-CM

## 2019-10-06 LAB — SURGICAL PATHOLOGY

## 2019-10-09 ENCOUNTER — Telehealth: Payer: Self-pay | Admitting: *Deleted

## 2019-10-09 DIAGNOSIS — Z853 Personal history of malignant neoplasm of breast: Secondary | ICD-10-CM

## 2019-10-09 NOTE — Telephone Encounter (Signed)
-----   Message from Nunzio Cobbs, MD sent at 10/08/2019  3:32 PM EDT ----- Please contact patient in follow up to her axillary lymph node biopsy which is negative for tumor.  Please have her follow up with her oncologist, who she appears to see regularly.   Ok to remove from mammogram hold.

## 2019-10-09 NOTE — Telephone Encounter (Signed)
-----   Message from Nunzio Cobbs, MD sent at 10/08/2019  3:38 PM EDT ----- Please contact patient with results of her axillary biopsy which showed no abnormal cells.   This is a second report.   Please have her follow up with her oncologist for further follow up of her enlarged left axillary lymph node.

## 2019-10-09 NOTE — Telephone Encounter (Signed)
Burnice Logan, RN  10/09/2019 4:51 PM EDT Back to Top    Spoke with patient. Patient is in the grocery store, she will return call to the office on 10/10/19. See telephone encounter dated 10/09/19.   Removed from [facility] hold.

## 2019-10-10 NOTE — Telephone Encounter (Signed)
Spoke with patient, advised as seen below per Dr. Quincy Simmonds.  Patient states she has not seen an oncologist for her breast since she moved from [place] in 1996.   Patient states she doe not seen an oncologist for the lung cancer screening, states  scans and testing are ordered by [facility], but has never seen an oncologist.   Patient states if she should be seen by an oncologist she would like a referral or recommendations locally. Advised I will provide an update to Dr. Quincy Simmonds and f/u. Patient agreeable.   Routing to Dr. Quincy Simmonds to review and advise.

## 2019-10-10 NOTE — Telephone Encounter (Signed)
Patient returned a call to Jill.   

## 2019-10-11 NOTE — Telephone Encounter (Signed)
Spoke with patient. Advised as seen below per Dr. Quincy Simmonds. Patient agreeable to referral, order placed. Patient is aware she will be contacted with appt details once scheduled. Patient verbalizes understanding and is agreeable.   [facility] recall placed.   Routing to [employer] .   Encounter closed.

## 2019-10-11 NOTE — Telephone Encounter (Signed)
[place] report now has addendum.  Patient will have axillary [place] in 6 months, so she needs recall for January, not March, 2022.  Please refer her to Dr. Jana Hakim in oncology due to her hx of left breast cancer.

## 2019-10-24 ENCOUNTER — Telehealth: Payer: Self-pay | Admitting: Obstetrics and Gynecology

## 2019-10-24 NOTE — Telephone Encounter (Signed)
All placed to follow up on a referral to the cancer center.

## 2019-10-24 NOTE — Telephone Encounter (Signed)
Patient returning call.

## 2019-11-23 ENCOUNTER — Telehealth: Payer: Self-pay | Admitting: Obstetrics and Gynecology

## 2019-11-23 NOTE — Telephone Encounter (Signed)
Call placed to follow up with patient in regards to obtaining old Oncology records. Left message on voicemail requesting a return call.

## 2019-11-24 NOTE — Telephone Encounter (Signed)
Patient is returning call.  °

## 2019-11-28 ENCOUNTER — Telehealth: Payer: Self-pay | Admitting: Oncology

## 2019-11-28 NOTE — Telephone Encounter (Signed)
Patient returned my call and she does have some of her old records. I did convey the phone number to [facility]. Patient to call and schedule an appointment.

## 2019-11-28 NOTE — Telephone Encounter (Signed)
Received a new pt referral from Dr. Quincy Simmonds for hx of breast cancer. Pt has been cld and scheduled to see Dr. Jana Hakim on 10/25 at 4pm w/labs at 330pm. She stated that she has records that will be mailed to our office. I asked that she put them in attn to New Patient Scheduler.

## 2020-01-12 ENCOUNTER — Other Ambulatory Visit: Payer: Self-pay | Admitting: *Deleted

## 2020-01-12 DIAGNOSIS — R911 Solitary pulmonary nodule: Secondary | ICD-10-CM

## 2020-01-14 NOTE — Progress Notes (Signed)
Adriana Hicks  Telephone:([phone] Fax:(336) [phone]     ID: Adriana Hicks DOB: 21-Mar-1946  MR#: [id number]  YKD#:[id number]  Patient Care Team: Adin Hector, MD as PCP - General (Internal Medicine) Nunzio Cobbs, MD as Consulting Physician (Obstetrics and Gynecology) Arline Asp, MD as Consulting Physician (Ophthalmology) Billy Turvey, Virgie Dad, MD as Consulting Physician (Oncology) Lucilla Lame, MD as Consulting Physician (Gastroenterology) Isaias Cowman, MD as Consulting Physician (Cardiology) Contogiannis, Audrea Muscat, MD as Consulting Physician (Plastic Surgery) Chauncey Cruel, MD OTHER MD:  CHIEF COMPLAINT: history of breast cancer (s/p left mastectomy)  CURRENT TREATMENT:    HISTORY OF CURRENT ILLNESS: Adriana Hicks has a history of left breast cancer, diagnosed at age 61. She underwent left breast biopsy for an estrogen and progesterone receptor positive tumor (3+).  08/21/1994.  The tumor had a low S phase fraction but was aneuploid.  This was followed by left modified radical mastectomy on 09/16/1994 under Dr. Shona Simpson at [facility] in [place]. 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  The patient received four cycles of adjuvant chemotherapy apparently consisting of cytoxan and adriamycin under Dr. Sonda Rumble at [facility] in [place]. Following this, she received nolvadex from 01/1995 through 2001. She was released from follow up there in 03/1999.  She has also previously been seen in the pulmonary nodule clinic at [facility] in 2013, 2017, and 2021. She was followed with low-dose screening CT scans, but she no longer meets criteria as of 05/2019.  The patient's subsequent history is as detailed below.   INTERVAL HISTORY: "Adriana Hicks" was evaluated in the breast cancer clinic on  01/15/2020.  Her most recent right screening mammogram was performed on 03/30/2019 at [place] showing: breast density category B; no evidence of malignancy.  More recently, she reported left axillary tenderness. She underwent left axilla ultrasound on 10/03/2019 showing an indeterminate 1 cm lymph node with mild diffuse cortical thickening.  She proceeded to biopsy of the node on 10/04/2019. Pathology [id number]) showed: no tumor identified (limited material). Flow cytometry was performed on the biopsy sample ([id number]) and showed: no monoclonal B-cell population or abnormal T-cell phenotype.   REVIEW OF SYSTEMS: The patient denies unusual headaches, visual changes, nausea, vomiting, stiff neck, dizziness, or gait imbalance. There has been no cough, phlegm production, or pleurisy, no chest pain or pressure, and no change in bowel or bladder habits. The patient denies fever, rash, bleeding, unexplained fatigue or unexplained weight loss. A detailed review of systems was otherwise entirely negative.   PAST MEDICAL HISTORY: Past Medical History:  Diagnosis Date  . Abnormal Pap smear of cervix 2014 and 2018   ASCUS HPV not detected--hx of cryotherapy to cervix in her 20's  . ADD (attention deficit disorder)   . ADD (attention deficit disorder)   . Anemia   . Arthritis    fingers  . Arthritis   . Breast cancer ([place])    Left; Age 46  . Bursitis    both hips  . Colitis   . Genital herpes 03/26/2009  . GERD (gastroesophageal reflux disease)   . History of chest pain    Related to reflux  . HSV-2 (herpes simplex virus 2) infection   . Hyperlipidemia   . Hypertension   . Hypothyroidism   . Osteopenia   . Personal history of chemotherapy   .  Personal history of tobacco use, presenting hazards to health 06/26/2015  . S/P appendectomy 1981  . Seborrheic dermatitis   . Vertigo    x1 - approx 5 yrs ago    PAST SURGICAL HISTORY: Past Surgical History:  Procedure Laterality  Date  . APPENDECTOMY    . AUGMENTATION MAMMAPLASTY  1982  . AUGMENTATION MAMMAPLASTY Bilateral 3845   silicone gel  . BREAST IMPLANT EXCHANGE Bilateral 04/21/2016   Procedure: REMOVAL OF BREAST IMPLANTS WITH IMMEDIATE REPLACMENTOF BREAST IMPLANTS;  Surgeon: Youlanda Roys, MD;  Location: [place];  Service: Plastics;  Laterality: Bilateral;  . BREAST SURGERY  2005   Breast Implant replaceddue to leaking  . BREAST SURGERY Left 2006   due to asymmetry  . CARDIAC CATHETERIZATION     2011 at [facility] - reflux related  . CERVICAL BIOPSY  W/ LOOP ELECTRODE EXCISION     unsure pathology   . CESAREAN SECTION  1985  . COLONOSCOPY WITH PROPOFOL N/A 06/17/2015   Procedure: COLONOSCOPY WITH PROPOFOL;  Surgeon: Lucilla Lame, MD;  Location: [place];  Service: Endoscopy;  Laterality: N/A;  PT PREFERS EARLY AM  . COLONOSCOPY WITH PROPOFOL N/A 07/16/2016   Procedure: COLONOSCOPY WITH PROPOFOL;  Surgeon: Lucilla Lame, MD;  Location: [place];  Service: Endoscopy;  Laterality: N/A;  requests early as possible  . COSMETIC SURGERY     eyelid surgery  . DILATION AND CURETTAGE OF UTERUS    . ENDOMETRIAL BIOPSY    . eye lid surgery    . MASTECTOMY Left    new implant on the Right  . MASTOPEXY Right 04/21/2016   Procedure: MASTOPEXY;  Surgeon: Youlanda Roys, MD;  Location: [place];  Service: Plastics;  Laterality: Right;  . POLYPECTOMY  06/17/2015   Procedure: POLYPECTOMY;  Surgeon: Lucilla Lame, MD;  Location: [place];  Service: Endoscopy;;  . TONSILLECTOMY  child  . TUBAL LIGATION      FAMILY HISTORY: Family History  Problem Relation Age of Onset  . Breast cancer Mother 79  . Heart disease Father   . Skin cancer Brother   . Crohn's disease Brother   The patient's father died from heart disease at the age of 53.  The patient's mother is 26 years old as of October 2021.  Patient had no sisters,.  She had 1 brother  who died from metastatic melanoma 2019-11-04.  There were also 2 paternal aunts with breast cancer in 1 maternal niece with breast cancer diagnosed at age 31.   GYNECOLOGIC HISTORY:  Patient's last menstrual period was 03/23/1994. Menarche: 74 years old Age at first live birth: 74 years old [place] P 1 LMP 1996 Contraceptive HRT   Hysterectomy? No, history of cryotherapy BSO? no   SOCIAL HISTORY: (updated 12/2019)  Wells Guiles "Adriana Hicks" retired from working for [employer].  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  Her husband and he is Biomedical engineer for a [employer].  Their son Wille Glaser lives in [place] and works in Psychologist, educational.  The patient has 3 grandchildren.  She is a Tourist information centre manager   ADVANCED DIRECTIVES: In the absence of any documents to the contrary the patient's husband is her healthcare power of attorney   HEALTH MAINTENANCE: Social History   Tobacco Use  . Smoking status: Former Smoker    Packs/day: 1.00    Years: 35.00    Pack years:  35.00    Types: Cigarettes    Quit date: 03/22/2004    Years since quitting: 15.8  . Smokeless tobacco: Never Used  Vaping Use  . Vaping Use: Never used  Substance Use Topics  . Alcohol use: Yes    Alcohol/week: 7.0 standard drinks    Types: 7 Glasses of wine per week  . Drug use: No     Colonoscopy: 2018/[facility]  PAP: 08/2018, negative  Bone density: 2019, osteoporosis   Allergies  Allergen Reactions  . Sulfamethoxazole Other (See Comments)    Vaginal itching     Current Outpatient Medications  Medication Sig Dispense Refill  . amphetamine-dextroamphetamine (ADDERALL) 20 MG tablet Take 20 mg by mouth daily.    . Ascorbic Acid (VITAMIN C PO) Take by mouth.    Marland Kitchen azelastine (ASTELIN) 0.1 % nasal spray Place into both nostrils 2 (two) times daily as needed for rhinitis. Use in each nostril as directed    . cholecalciferol (VITAMIN D) 1000 UNITS tablet Take 1,000 Units by mouth daily.    .  Clobetasol Propionate (CLODAN) 0.05 % shampoo Apply topically daily.    . cyanocobalamin (,VITAMIN B-12,) 1000 MCG/ML injection USE 1 ML PER INJECTION, IM WEEKLY 10 mL 3  . ketoconazole (NIZORAL) 2 % shampoo Apply topically 3 (three) times a week.    . levothyroxine (SYNTHROID) 75 MCG tablet Take 75 mcg by mouth daily before breakfast.    . losartan (COZAAR) 100 MG tablet Take 100 mg by mouth daily.    . Melatonin 5 MG TABS Take by mouth at bedtime.     . mometasone (ELOCON) 0.1 % lotion Apply topically as needed.    . Multiple Vitamin (MULTIVITAMIN) tablet Take 1 tablet by mouth daily.    . Omega-3 Fatty Acids (FISH OIL PO) Take by mouth.    Marland Kitchen OVER THE COUNTER MEDICATION [place] Bone Strength Takes: 2 tablets qhs    . S-Adenosylmethionine (SAM-E PO) Take by mouth daily.    Marland Kitchen UNABLE TO FIND Vitamin E Vaginal supp 2 X weekly    . valACYclovir (VALTREX) 500 MG tablet Take one tablet twice daily at onset of outbreak x 3 days 30 tablet 2  . doxepin (SINEQUAN) 10 MG capsule TAKE 1 CAPSULE (10 MG TOTAL) BY MOUTH AT BEDTIME. 90 capsule 1  . NON FORMULARY CBD lotion prn on neck    . UNABLE TO FIND Bio freeze     No current facility-administered medications for this visit.    OBJECTIVE: White woman in no acute distress  Vitals:   01/15/20 1542  BP: (!) 167/73  Pulse: 65  Resp: 18  Temp: 97.7 F (36.5 C)  SpO2: 100%     Body mass index is 24.31 kg/m.   Wt Readings from Last 3 Encounters:  01/15/20 132 lb 14.4 oz (60.3 kg)  10/02/19 138 lb 9.6 oz (62.9 kg)  09/13/18 144 lb (65.3 kg)      ECOG FS:1 - Symptomatic but completely ambulatory  Ocular: Sclerae unicteric, pupils round and equal Ear-nose-throat: Wearing a mask Lymphatic: No cervical or supraclavicular adenopathy Lungs no rales or rhonchi Heart regular rate and rhythm Abd soft, nontender, positive bowel sounds MSK no focal spinal tenderness, no joint edema Neuro: non-focal, well-oriented, appropriate affect Breasts:  The right breast is status post implant placement.  There are no findings of concern.  The left breast is status post mastectomy and TRAM flap reconstruction with implant placement.  There are no suspicious findings.  Both  axillae are benign.   LAB RESULTS:  CMP     Component Value Date/Time   NA 137 01/15/2020 1528   NA 139 05/21/2015 0855   K 4.2 01/15/2020 1528   CL 103 01/15/2020 1528   CO2 25 01/15/2020 1528   GLUCOSE 145 (H) 01/15/2020 1528   BUN 12 01/15/2020 1528   BUN 18 05/21/2015 0855   CREATININE 0.81 01/15/2020 1528   CALCIUM 9.8 01/15/2020 1528   PROT 7.2 01/15/2020 1528   PROT 7.1 05/21/2015 0855   ALBUMIN 4.4 01/15/2020 1528   ALBUMIN 4.5 05/21/2015 0855   AST 21 01/15/2020 1528   ALT 23 01/15/2020 1528   ALKPHOS 69 01/15/2020 1528   BILITOT 0.3 01/15/2020 1528   GFRNONAA >60 01/15/2020 1528   GFRAA >60 04/16/2016 1225    No results found for: TOTALPROTELP, ALBUMINELP, A1GS, A2GS, BETS, BETA2SER, GAMS, MSPIKE, SPEI  Lab Results  Component Value Date   WBC 5.5 01/15/2020   NEUTROABS 2.8 01/15/2020   HGB 11.4 (L) 01/15/2020   HCT 34.5 (L) 01/15/2020   MCV 91.8 01/15/2020   PLT 264 01/15/2020    No results found for: LABCA2  No components found for: HYIFOY774  No results for input(s): INR in the last 168 hours.  No results found for: LABCA2  No results found for: JOI786  No results found for: VEH209  No results found for: OBS962  No results found for: CA2729  No components found for: HGQUANT  No results found for: CEA1 / No results found for: CEA1   No results found for: AFPTUMOR  No results found for: CHROMOGRNA  No results found for: KPAFRELGTCHN, LAMBDASER, KAPLAMBRATIO (kappa/lambda light chains)  No results found for: HGBA, HGBA2QUANT, HGBFQUANT, HGBSQUAN (Hemoglobinopathy evaluation)   No results found for: LDH  No results found for: IRON, TIBC, IRONPCTSAT (Iron and TIBC)  No results found for: FERRITIN  Urinalysis     Component Value Date/Time   COLORURINE YELLOW 05/04/2012 1213   APPEARANCEUR CLOUDY 05/04/2012 1213   LABSPEC 1.010 05/04/2012 1213   PHURINE 7.0 05/04/2012 1213   GLUCOSEU NEGATIVE 05/04/2012 1213   HGBUR NEGATIVE 05/04/2012 1213   BILIRUBINUR n 08/13/2015 1057   BILIRUBINUR NEGATIVE 05/04/2012 1213   KETONESUR NEGATIVE 05/04/2012 1213   PROTEINUR n 08/13/2015 1057   PROTEINUR TRACE 05/04/2012 1213   UROBILINOGEN negative 08/13/2015 1057   NITRITE n 08/13/2015 1057   NITRITE NEGATIVE 05/04/2012 1213   LEUKOCYTESUR Negative 08/13/2015 1057   LEUKOCYTESUR TRACE 05/04/2012 1213     STUDIES: No results found.   ELIGIBLE FOR AVAILABLE RESEARCH PROTOCOL: no  ASSESSMENT: 74 y.o. [place] woman status post left modified radical mastectomy on 09/15/1984 for a pTis(mic) pN0 noninvasive breast cancer, estrogen and progesterone receptor positive  (a) a total of 13 axillary lymph nodes were removed  (b) adjuvant chemotherapy consisted of (?doxorubicin and cyclophosphamide) x4  (c) patient received tamoxifen from [place] through 2001    (1) status post left TRAM flap reconstruction 8366 with silicone implant  (a) status post bilateral capsulotomies with removal of bilateral breast implants and  immediate replacement 04/21/2016  (b) implant replacements: Right breast is Mentor style #1000 smooth 400 mL moderate plus profile round smooth silicone gel implant, reference #[id number], lot #[id number]; left breast, Mentor style 1000 smooth 325 mL round smooth moderate plus profile silicone gel breast implant, reference #[id number], lot #[id number].  (2) osteoporosis:  (a) patient is intolerant of oral bisphosphonates  (b) patient opted against Prolia or Reclast  PLAN: I met today with Adriana Hicks to review her situation.  I do not have her original left breast biopsy from May 1996.  We do know that that tumor was estrogen and progesterone receptor positive.  It also was aneuploid, but had a low S phase.   It is possible there was an invasive tumor in the biopsy which was completely removed by the biopsy.  At any rate at the time of surgery there was only a microscopic residual noninvasive tumor.  It is difficult to know why the patient needed adjuvant chemotherapy but in any case she did receive it.  She also received 5 years of tamoxifen in addition  We reviewed the fact that noninvasive breast cancer is cured by mastectomy close to 100% of the time so her left breast cancer is cured.  Her risk of developing another breast cancer at this point--since she only has one breast and since she took tamoxifen for 5 years--is approximately 0.25 %/year.  That means she would have a 5% risk of developing breast cancer in the next 20years.  Since her breast density is low, category B.  She will do well with mammography yearly and a yearly physician breast exam.  No additional tests are needed except to evaluate any new findings.  We did discussed osteoporosis issues and I encouraged her to consider denosumab/Prolia.  At this point as she wishes to demur.  Adriana Hicks does qualify for genetics testing and she tells me she did have genetics testing previously.  She is not sure she wishes to repeat that at this point.  She understands that we are now doing a much more extensive panel of genes.  I asked her to find a copy of her results and let us or Dr. Quincy Simmonds review them.  It would be easy enough to arrange a virtual visit with one of our genetics counselors if the prior tests were felt to be inadequate  At this point I am not making a return appointment here for Adriana Hicks but of course I will be glad to see her at any point in the future if and when the need arises  Total encounter time: 50 minutes.Sarajane Jews C. Japneet Staggs, MD 01/15/2020 5:05 PM Medical Oncology and Hematology [facility] [place], [place] [address] Tel. [phone]    Fax. [phone]   This document serves as a record of  services personally performed by Lurline Del, MD. It was created on his behalf by Wilburn Mylar, a trained medical scribe. The creation of this record is based on the scribe's personal observations and the provider's statements to them.    I, Lurline Del MD, have reviewed the above documentation for accuracy and completeness, and I agree with the above.    *Total Encounter Time as defined by the Centers for Medicare and Medicaid Services includes, in addition to the face-to-face time of a patient visit (documented in the note above) non-face-to-face time: obtaining and reviewing outside history, ordering and reviewing medications, tests or procedures, care coordination (communications with other health care professionals or caregivers) and documentation in the medical record.

## 2020-01-15 ENCOUNTER — Inpatient Hospital Stay: Payer: Federal, State, Local not specified - PPO | Attending: Oncology | Admitting: Oncology

## 2020-01-15 ENCOUNTER — Inpatient Hospital Stay: Payer: Federal, State, Local not specified - PPO

## 2020-01-15 ENCOUNTER — Other Ambulatory Visit: Payer: Self-pay

## 2020-01-15 VITALS — BP 167/73 | HR 65 | Temp 97.7°F | Resp 18 | Ht 62.0 in | Wt 132.9 lb

## 2020-01-15 DIAGNOSIS — D0512 Intraductal carcinoma in situ of left breast: Secondary | ICD-10-CM | POA: Insufficient documentation

## 2020-01-15 DIAGNOSIS — F988 Other specified behavioral and emotional disorders with onset usually occurring in childhood and adolescence: Secondary | ICD-10-CM | POA: Insufficient documentation

## 2020-01-15 DIAGNOSIS — M129 Arthropathy, unspecified: Secondary | ICD-10-CM | POA: Diagnosis not present

## 2020-01-15 DIAGNOSIS — M818 Other osteoporosis without current pathological fracture: Secondary | ICD-10-CM | POA: Diagnosis not present

## 2020-01-15 DIAGNOSIS — Z803 Family history of malignant neoplasm of breast: Secondary | ICD-10-CM | POA: Insufficient documentation

## 2020-01-15 DIAGNOSIS — M8580 Other specified disorders of bone density and structure, unspecified site: Secondary | ICD-10-CM | POA: Diagnosis not present

## 2020-01-15 DIAGNOSIS — Z79899 Other long term (current) drug therapy: Secondary | ICD-10-CM | POA: Diagnosis not present

## 2020-01-15 DIAGNOSIS — Z853 Personal history of malignant neoplasm of breast: Secondary | ICD-10-CM | POA: Insufficient documentation

## 2020-01-15 DIAGNOSIS — Z87891 Personal history of nicotine dependence: Secondary | ICD-10-CM | POA: Insufficient documentation

## 2020-01-15 DIAGNOSIS — E785 Hyperlipidemia, unspecified: Secondary | ICD-10-CM | POA: Insufficient documentation

## 2020-01-15 DIAGNOSIS — R911 Solitary pulmonary nodule: Secondary | ICD-10-CM | POA: Diagnosis not present

## 2020-01-15 DIAGNOSIS — Z9012 Acquired absence of left breast and nipple: Secondary | ICD-10-CM | POA: Diagnosis not present

## 2020-01-15 DIAGNOSIS — K219 Gastro-esophageal reflux disease without esophagitis: Secondary | ICD-10-CM | POA: Diagnosis not present

## 2020-01-15 DIAGNOSIS — Z9221 Personal history of antineoplastic chemotherapy: Secondary | ICD-10-CM | POA: Insufficient documentation

## 2020-01-15 DIAGNOSIS — E039 Hypothyroidism, unspecified: Secondary | ICD-10-CM | POA: Diagnosis not present

## 2020-01-15 DIAGNOSIS — I10 Essential (primary) hypertension: Secondary | ICD-10-CM | POA: Insufficient documentation

## 2020-01-15 LAB — CBC WITH DIFFERENTIAL (CANCER CENTER ONLY)
Abs Immature Granulocytes: 0.01 10*3/uL (ref 0.00–0.07)
Basophils Absolute: 0.1 10*3/uL (ref 0.0–0.1)
Basophils Relative: 1 %
Eosinophils Absolute: 0.1 10*3/uL (ref 0.0–0.5)
Eosinophils Relative: 1 %
HCT: 34.5 % — ABNORMAL LOW (ref 36.0–46.0)
Hemoglobin: 11.4 g/dL — ABNORMAL LOW (ref 12.0–15.0)
Immature Granulocytes: 0 %
Lymphocytes Relative: 38 %
Lymphs Abs: 2.1 10*3/uL (ref 0.7–4.0)
MCH: 30.3 pg (ref 26.0–34.0)
MCHC: 33 g/dL (ref 30.0–36.0)
MCV: 91.8 fL (ref 80.0–100.0)
Monocytes Absolute: 0.5 10*3/uL (ref 0.1–1.0)
Monocytes Relative: 9 %
Neutro Abs: 2.8 10*3/uL (ref 1.7–7.7)
Neutrophils Relative %: 51 %
Platelet Count: 264 10*3/uL (ref 150–400)
RBC: 3.76 MIL/uL — ABNORMAL LOW (ref 3.87–5.11)
RDW: 12.7 % (ref 11.5–15.5)
WBC Count: 5.5 10*3/uL (ref 4.0–10.5)
nRBC: 0 % (ref 0.0–0.2)

## 2020-01-15 LAB — CMP (CANCER CENTER ONLY)
ALT: 23 U/L (ref 0–44)
AST: 21 U/L (ref 15–41)
Albumin: 4.4 g/dL (ref 3.5–5.0)
Alkaline Phosphatase: 69 U/L (ref 38–126)
Anion gap: 9 (ref 5–15)
BUN: 12 mg/dL (ref 8–23)
CO2: 25 mmol/L (ref 22–32)
Calcium: 9.8 mg/dL (ref 8.9–10.3)
Chloride: 103 mmol/L (ref 98–111)
Creatinine: 0.81 mg/dL (ref 0.44–1.00)
GFR, Estimated: >60 mL/min (ref >60)
Glucose, Bld: 145 mg/dL — ABNORMAL HIGH (ref 70–99)
Potassium: 4.2 mmol/L (ref 3.5–5.1)
Sodium: 137 mmol/L (ref 135–145)
Total Bilirubin: 0.3 mg/dL (ref 0.3–1.2)
Total Protein: 7.2 g/dL (ref 6.5–8.1)

## 2020-01-18 ENCOUNTER — Telehealth: Payer: Self-pay | Admitting: Oncology

## 2020-01-18 NOTE — Telephone Encounter (Signed)
No 10/25 los, no changes made to pt schedule

## 2020-01-22 ENCOUNTER — Encounter: Payer: Self-pay | Admitting: Oncology

## 2020-03-11 ENCOUNTER — Other Ambulatory Visit: Payer: Self-pay | Admitting: Certified Nurse Midwife

## 2020-03-11 ENCOUNTER — Other Ambulatory Visit: Payer: Self-pay | Admitting: Obstetrics and Gynecology

## 2020-03-11 DIAGNOSIS — Z1231 Encounter for screening mammogram for malignant neoplasm of breast: Secondary | ICD-10-CM

## 2020-04-18 ENCOUNTER — Ambulatory Visit
Admission: RE | Admit: 2020-04-18 | Discharge: 2020-04-18 | Disposition: A | Discharge status: Home / Self Care | Payer: Federal, State, Local not specified - PPO | Source: Ambulatory Visit | Attending: Obstetrics and Gynecology | Admitting: Obstetrics and Gynecology

## 2020-04-18 ENCOUNTER — Other Ambulatory Visit: Payer: Self-pay

## 2020-04-18 DIAGNOSIS — Z1231 Encounter for screening mammogram for malignant neoplasm of breast: Secondary | ICD-10-CM

## 2020-04-23 ENCOUNTER — Telehealth: Payer: Self-pay | Admitting: *Deleted

## 2020-04-23 DIAGNOSIS — R2232 Localized swelling, mass and lump, left upper limb: Secondary | ICD-10-CM

## 2020-04-23 NOTE — Telephone Encounter (Signed)
-----   Message from Ramond Craver, [place] sent at 04/23/2020  9:48 AM EST ----- Regarding: result note Nunzio Cobbs, MD  [facility] Triage Results to patient through My Chart.  Please schedule a left axillary ultrasound appointment follow up at the [facility].   Hi Adriana Hicks,   Your mammogram shows no sign of cancer.  My office will contact you to schedule a left axillary ultrasound follow up.   Please contact the office for any questions.   Josefa Half, MD

## 2020-04-23 NOTE — Telephone Encounter (Signed)
Patient scheduled on 05/16/20 @ 12:20 for left axially ultrasound

## 2020-04-29 NOTE — Telephone Encounter (Addendum)
Left detailed message detailed message on voicemail and also informed she can see appointment time and date in my chart.

## 2020-05-01 IMAGING — MR MR HIP*L* W/O CM
5 series · 32 of 40 positions shown · non-contrast
Comparison: Right hip x-rays dated April 20, 2014.

CLINICAL DATA: Chronic, progressively worsening bilateral hip pain
radiating down both legs.

EXAM:
MR OF THE LEFT HIP WITHOUT CONTRAST
MR OF THE RIGHT HIP WITHOUT CONTRAST
TECHNIQUE: Multiplanar, multisequence MR imaging was performed. No intravenous
contrast was administered.

[Series 2: T1 · coronal · left · 4.0mm · 0.47mm/px · 2 of 38 slices shown]
[im 1/38]
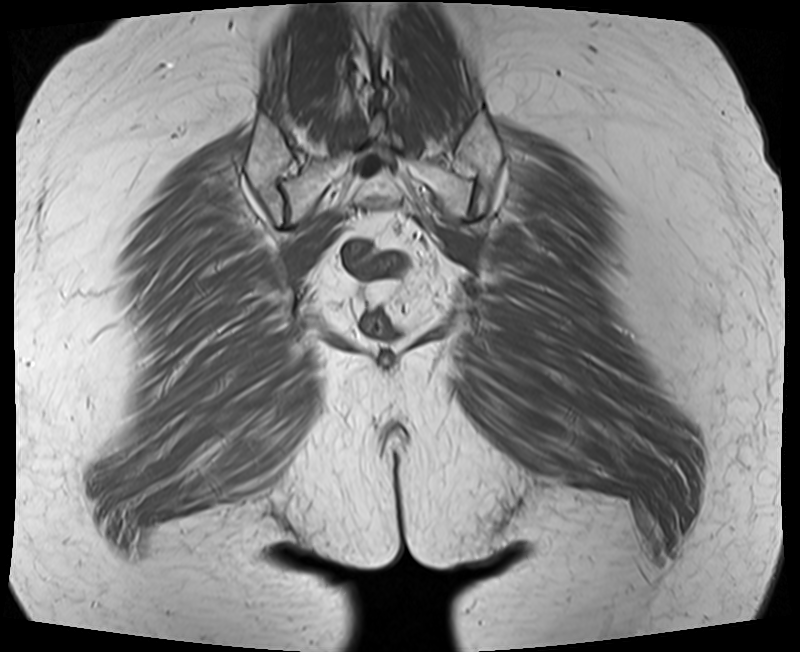
[im 5/38]
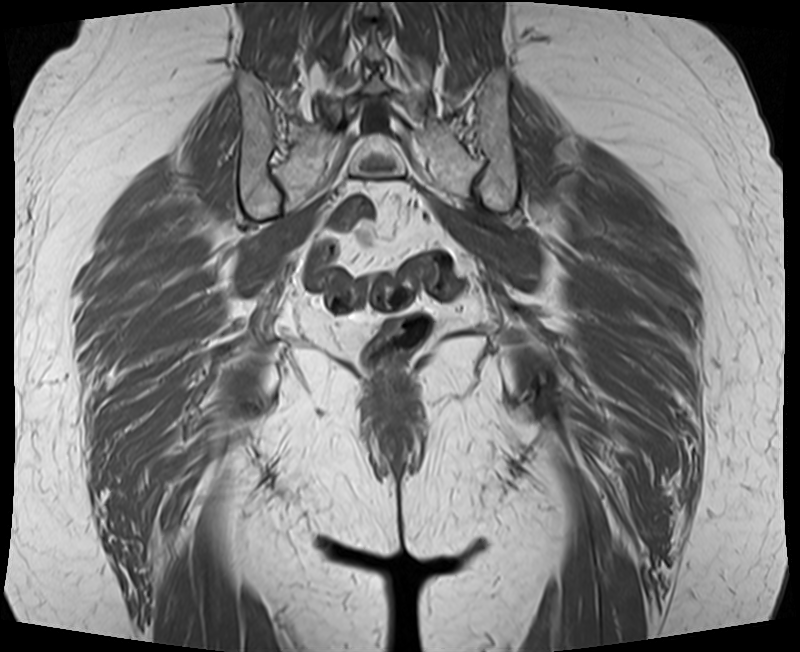

[Series 3: T2 fat-sat · coronal · left · 4.0mm · 1.19mm/px · 9 of 38 slices shown (1 of 2)]
[im 1/38]
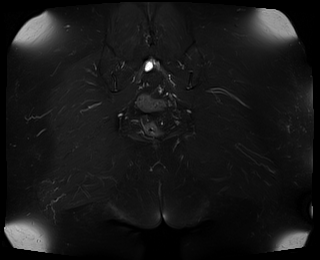
[im 5/38]
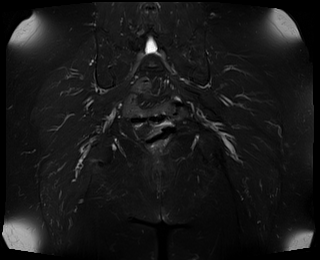
[im 10/38]
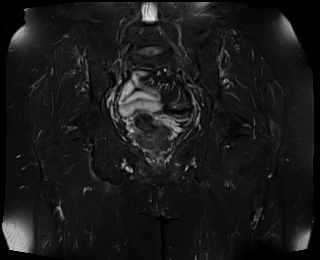
[im 14/38]
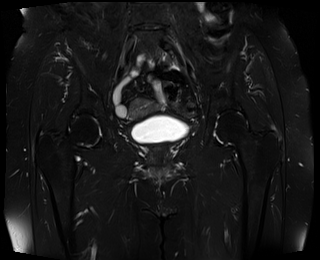
[im 19/38]
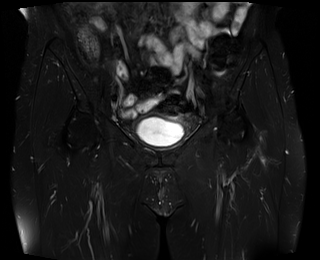
[im 24/38]
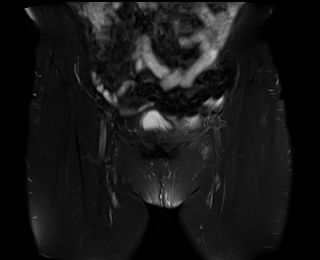
[im 28/38]
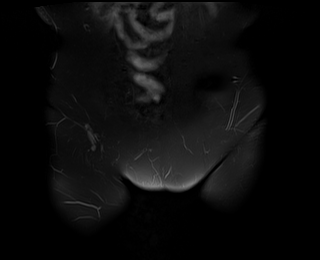
[im 33/38]
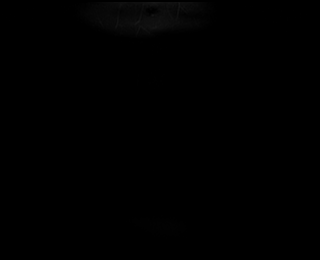
[im 38/38]
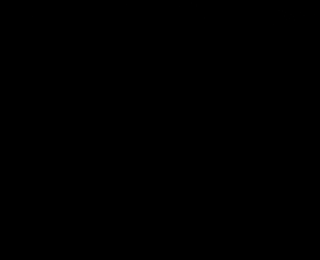

[Series 9: PD fat-sat · sagittal · left · 4.0mm · 0.70mm/px · 7 of 29 slices shown (1 of 2)]
[im 1/29]
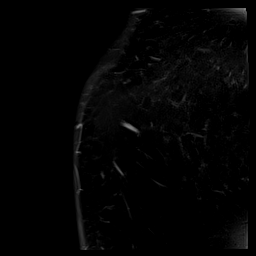
[im 5/29]
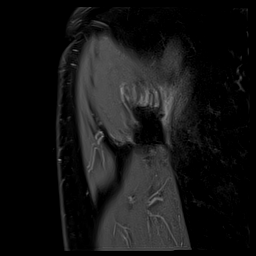
[im 10/29]
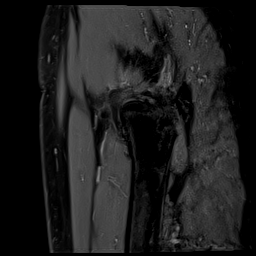
[im 15/29]
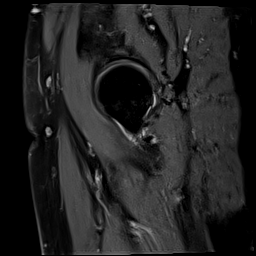
[im 19/29]
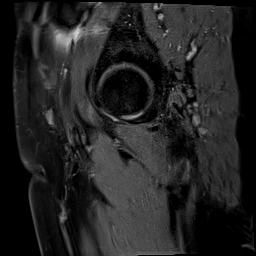
[im 24/29]
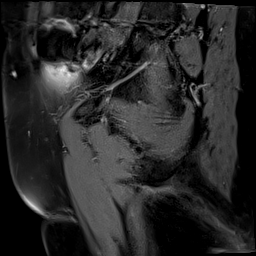
[im 29/29]
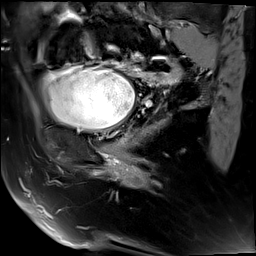

[Series 10: T2 fat-sat · axial · left · 4.0mm · 0.35mm/px · z∈[-28,+117]mm · 7 of 30 slices shown (2 of 2)]
[im 1/30]
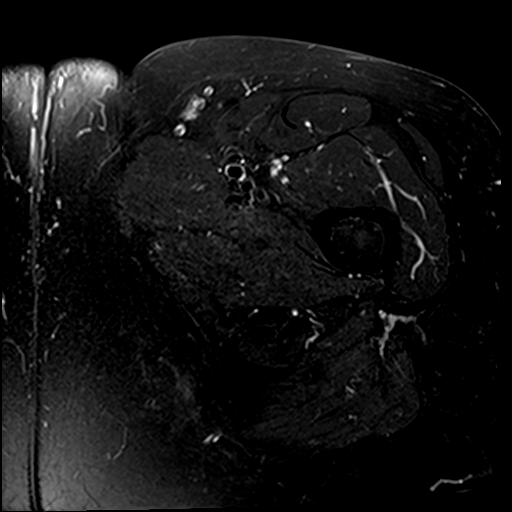
[im 5/30]
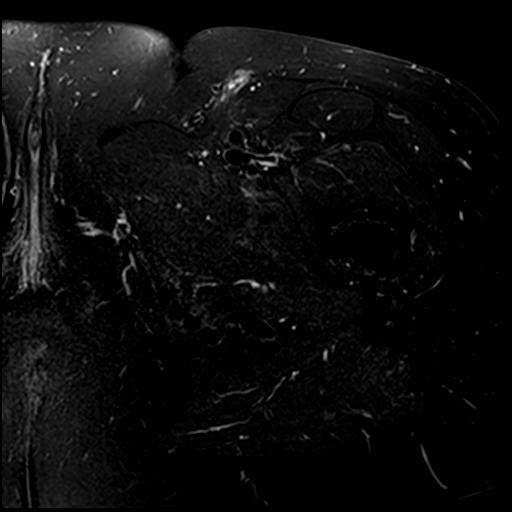
[im 10/30]
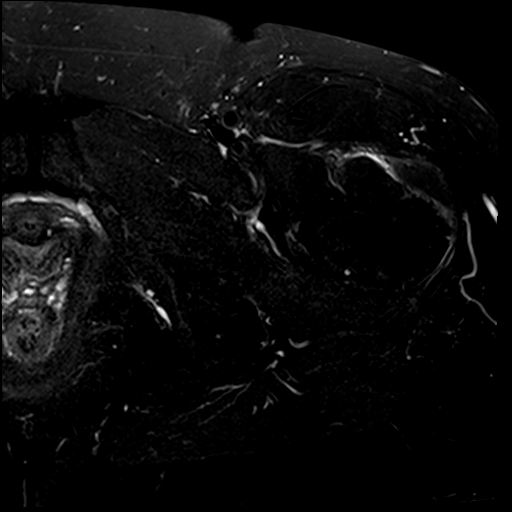
[im 15/30]
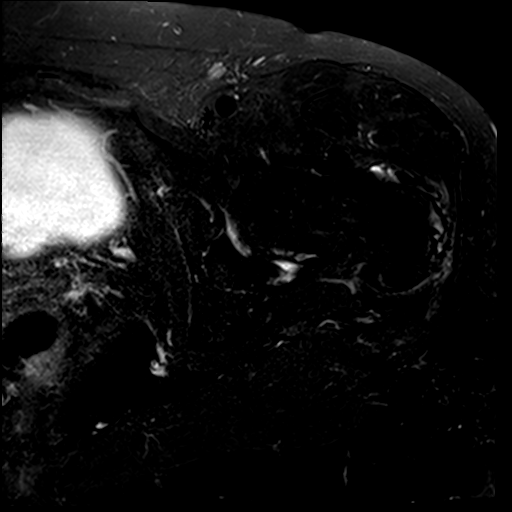
[im 20/30]
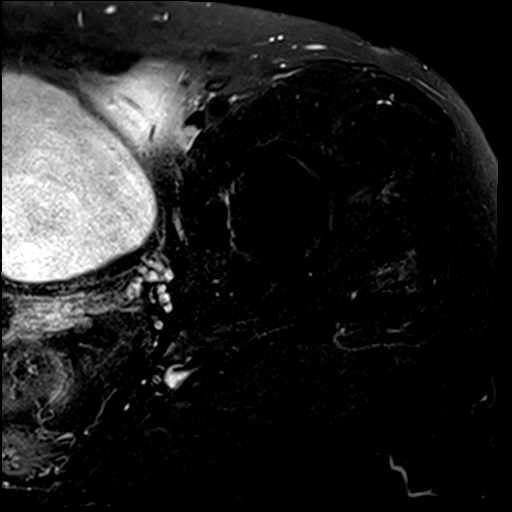
[im 25/30]
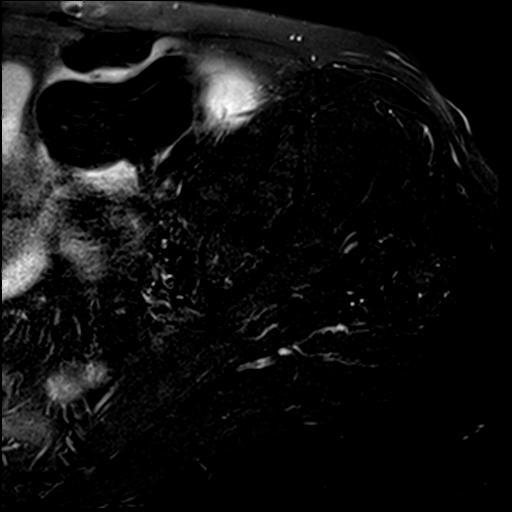
[im 30/30]
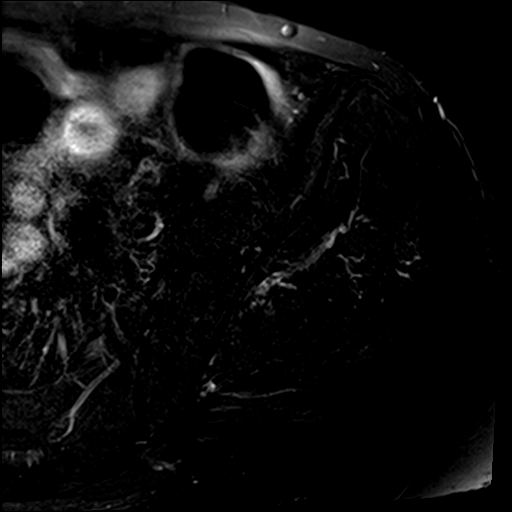

[Series 11: PD fat-sat · coronal · left · 4.0mm · 0.70mm/px · 7 of 29 slices shown (2 of 2)]
[im 1/29]
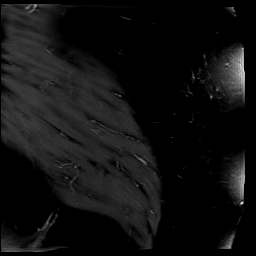
[im 5/29]
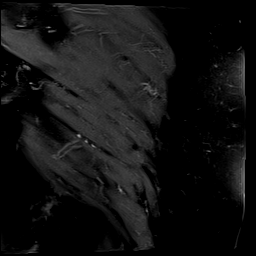
[im 10/29]
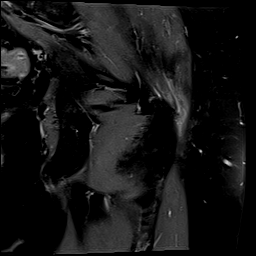
[im 15/29]
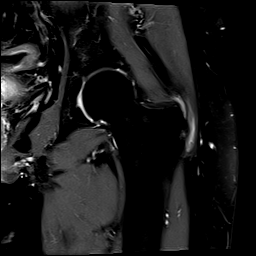
[im 19/29]
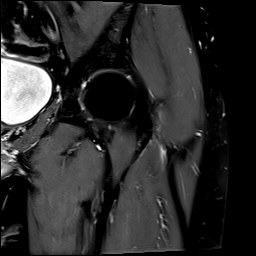
[im 24/29]
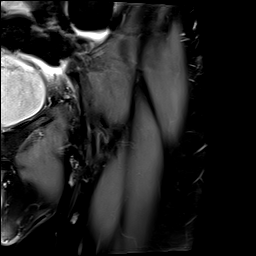
[im 29/29]
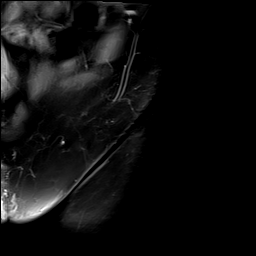

[32 of 40 positions shown; findings below may reference images not displayed]

FINDINGS: Bones: There is no evidence of acute fracture, dislocation or
avascular necrosis. No focal bone lesion. Mild degenerative marrow
edema along the inferior sacral aspect of the right sacroiliac
joint. The left sacroiliac joint and pubic symphysis are
unremarkable.

Articular cartilage and labrum

Articular cartilage: Focal mild partial-thickness cartilage loss
along the left posterosuperior hip joint without subchondral signal
abnormality. No focal cartilage defect in the right hip joint.

Labrum: Small tear of the right superior labrum. The left labrum is
grossly intact.

Joint or bursal effusion

Joint effusion: No significant hip joint effusion.

Bursae: No focal periarticular fluid collection.

Muscles and tendons

Muscles and tendons: Moderate left and mild right gluteus medius and
minimus tendinosis. The bilateral hamstring and iliopsoas tendons
are unremarkable. No muscle edema or atrophy.

Other findings

Miscellaneous: The visualized internal pelvic contents appear
unremarkable.
IMPRESSION: 1. Moderate left and mild right gluteus medius and minimus
tendinosis.
2. Early left hip osteoarthritis. Mild right sacroiliac joint
osteoarthritis.
3. Right superior labral tear.

## 2020-05-01 IMAGING — MR MR HIP*R* W/O CM
5 series · 32 of 40 positions shown · non-contrast
Comparison: Right hip x-rays dated April 20, 2014.

CLINICAL DATA: Chronic, progressively worsening bilateral hip pain
radiating down both legs.

EXAM:
MR OF THE LEFT HIP WITHOUT CONTRAST
MR OF THE RIGHT HIP WITHOUT CONTRAST
TECHNIQUE: Multiplanar, multisequence MR imaging was performed. No intravenous
contrast was administered.

[Series 10: T1 · coronal · right · 4.0mm · 0.47mm/px · 2 of 38 slices shown]
[im 1/38]
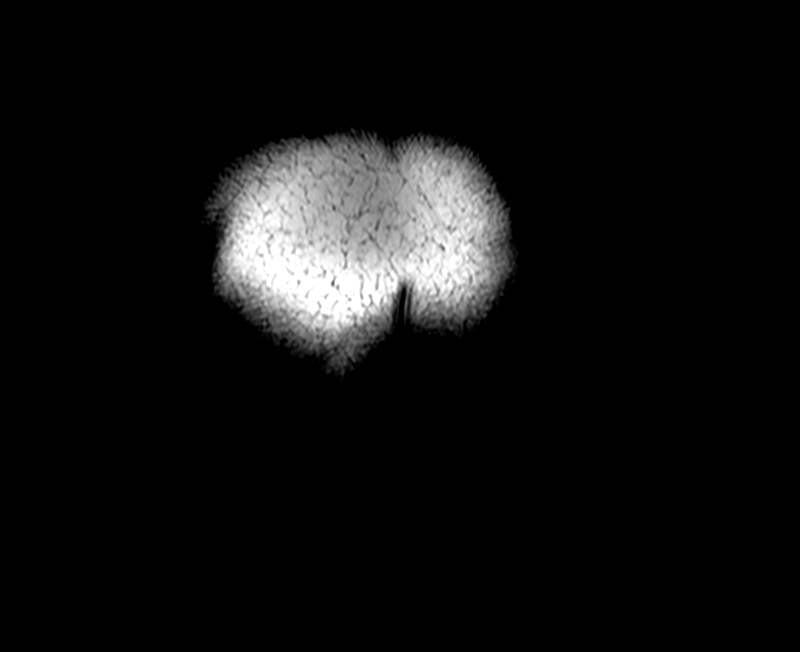
[im 5/38]
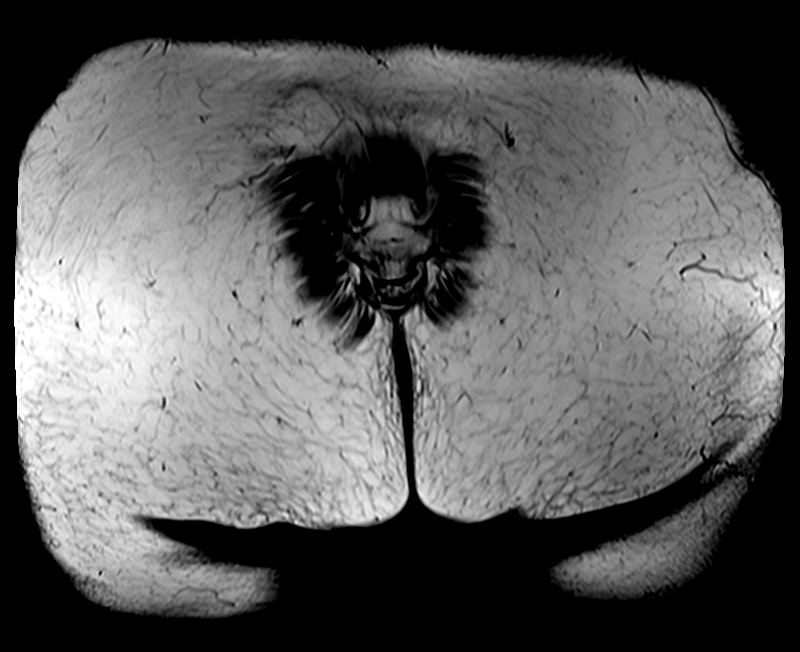

[Series 11: T2 fat-sat · coronal · right · 4.0mm · 1.19mm/px · 9 of 38 slices shown (1 of 2)]
[im 1/38]
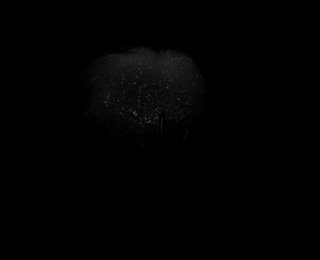
[im 5/38]
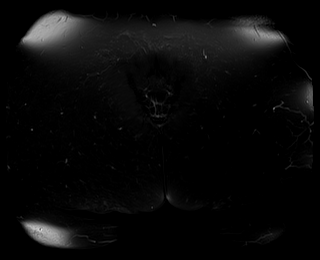
[im 10/38]
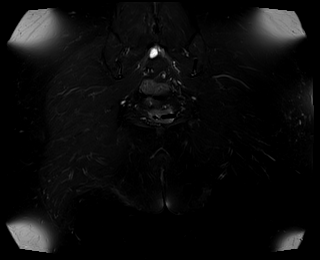
[im 14/38]
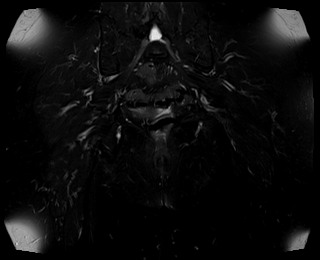
[im 19/38]
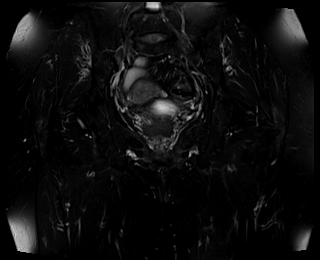
[im 24/38]
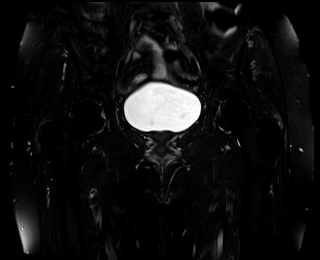
[im 28/38]
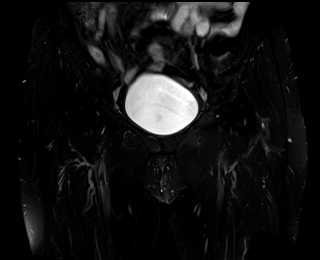
[im 33/38]
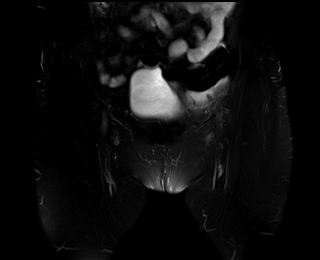
[im 38/38]
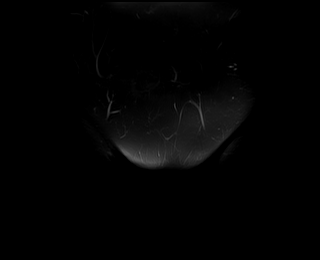

[Series 12: T2 fat-sat · axial · right · 4.0mm · 0.35mm/px · z∈[-60,+85]mm · 7 of 30 slices shown (2 of 2)]
[im 1/30]
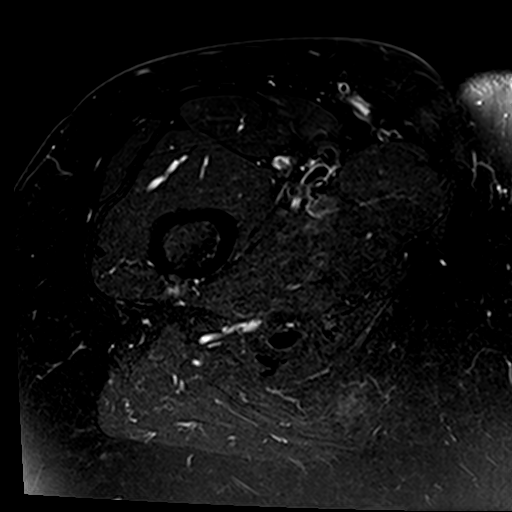
[im 5/30]
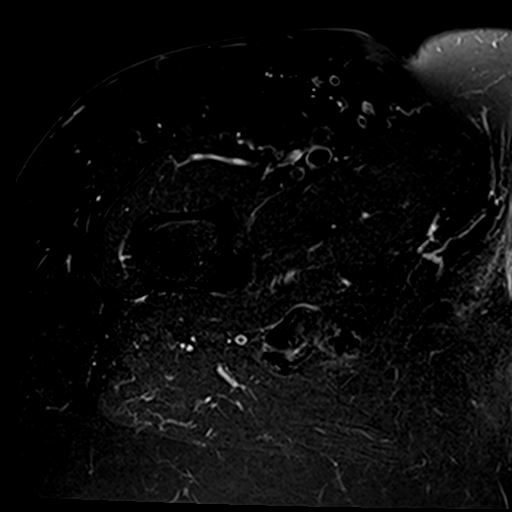
[im 10/30]
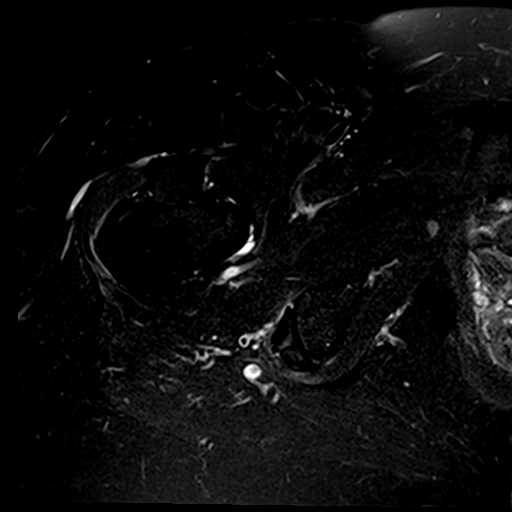
[im 15/30]
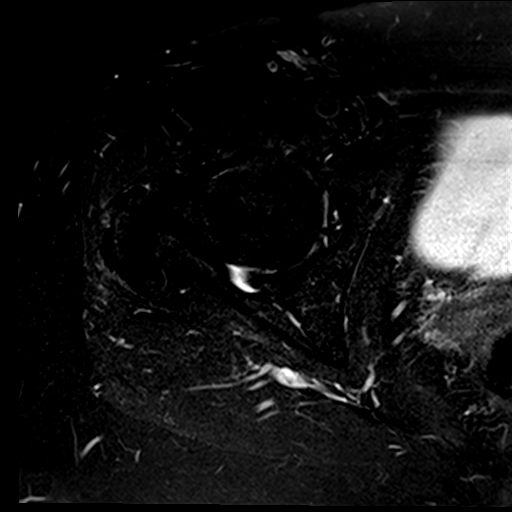
[im 20/30]
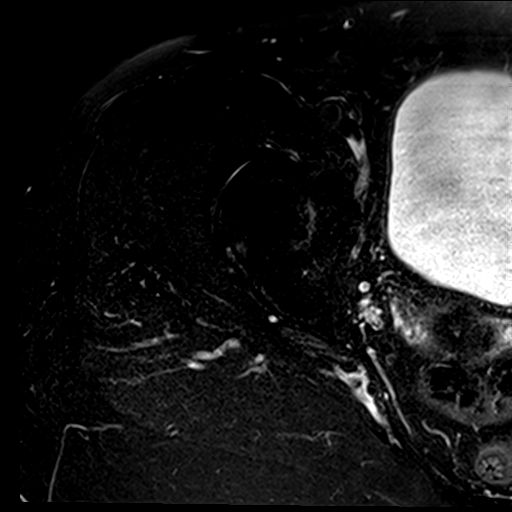
[im 25/30]
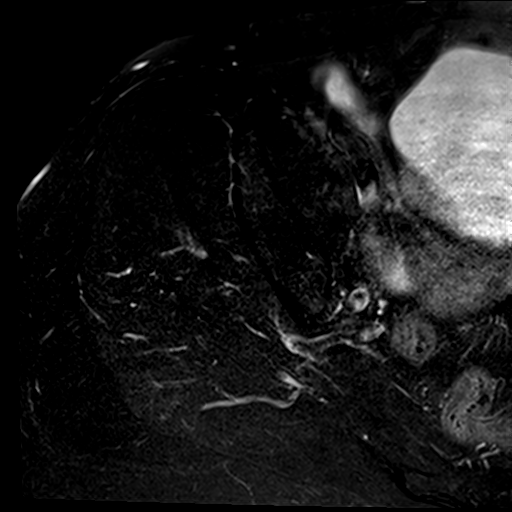
[im 30/30]
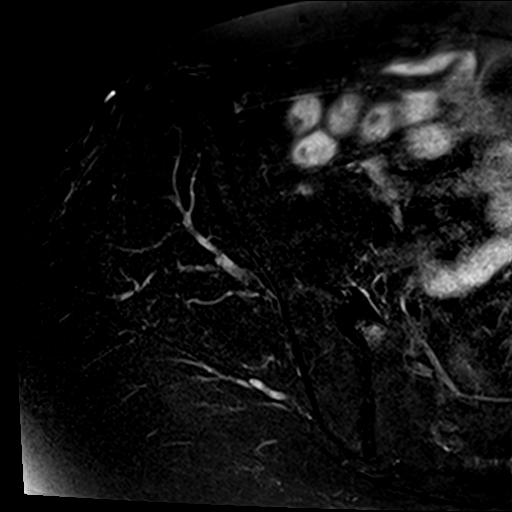

[Series 13: PD fat-sat · sagittal · right · 4.0mm · 0.70mm/px · 7 of 29 slices shown (1 of 2)]
[im 1/29]
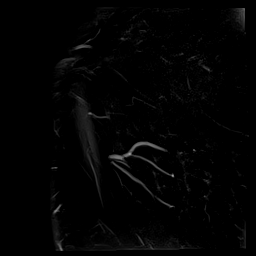
[im 5/29]
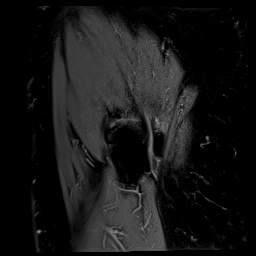
[im 10/29]
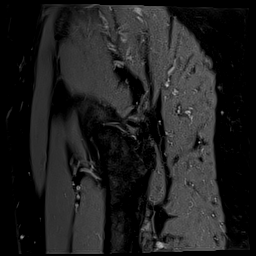
[im 15/29]
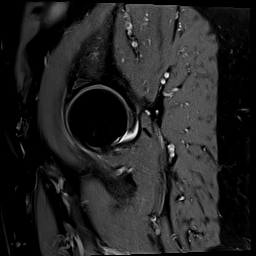
[im 19/29]
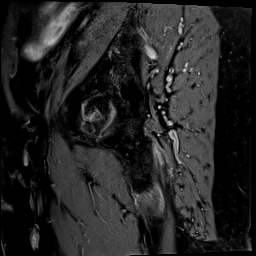
[im 24/29]
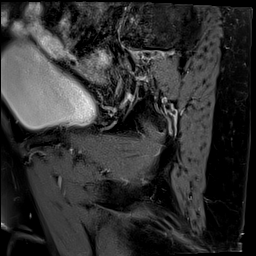
[im 29/29]
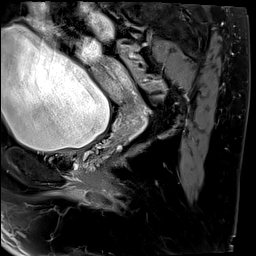

[Series 14: PD fat-sat · coronal · right · 4.0mm · 0.70mm/px · 7 of 29 slices shown (2 of 2)]
[im 1/29]
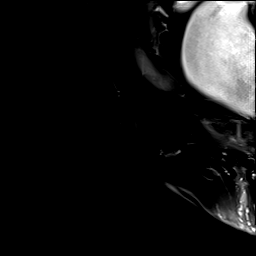
[im 5/29]
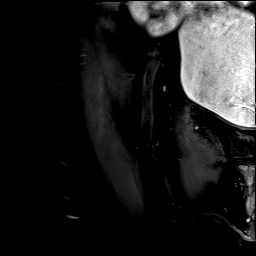
[im 10/29]
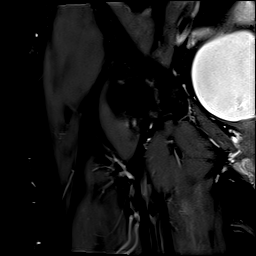
[im 15/29]
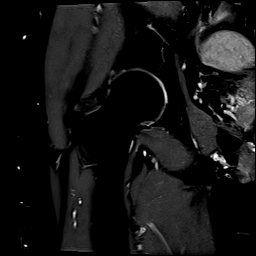
[im 19/29]
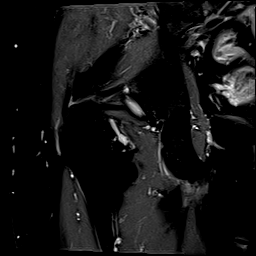
[im 24/29]
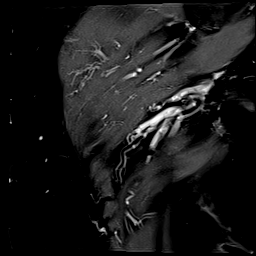
[im 29/29]
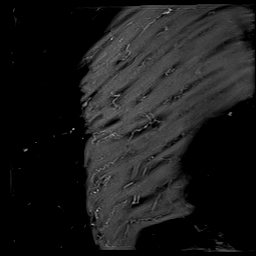

[32 of 40 positions shown; findings below may reference images not displayed]

FINDINGS: Bones: There is no evidence of acute fracture, dislocation or
avascular necrosis. No focal bone lesion. Mild degenerative marrow
edema along the inferior sacral aspect of the right sacroiliac
joint. The left sacroiliac joint and pubic symphysis are
unremarkable.

Articular cartilage and labrum

Articular cartilage: Focal mild partial-thickness cartilage loss
along the left posterosuperior hip joint without subchondral signal
abnormality. No focal cartilage defect in the right hip joint.

Labrum: Small tear of the right superior labrum. The left labrum is
grossly intact.

Joint or bursal effusion

Joint effusion: No significant hip joint effusion.

Bursae: No focal periarticular fluid collection.

Muscles and tendons

Muscles and tendons: Moderate left and mild right gluteus medius and
minimus tendinosis. The bilateral hamstring and iliopsoas tendons
are unremarkable. No muscle edema or atrophy.

Other findings

Miscellaneous: The visualized internal pelvic contents appear
unremarkable.
IMPRESSION: 1. Moderate left and mild right gluteus medius and minimus
tendinosis.
2. Early left hip osteoarthritis. Mild right sacroiliac joint
osteoarthritis.
3. Right superior labral tear.

## 2020-05-16 ENCOUNTER — Other Ambulatory Visit: Payer: Self-pay

## 2020-05-16 ENCOUNTER — Ambulatory Visit
Admission: RE | Admit: 2020-05-16 | Discharge: 2020-05-16 | Disposition: A | Discharge status: Home / Self Care | Payer: Federal, State, Local not specified - PPO | Source: Ambulatory Visit | Attending: Obstetrics and Gynecology | Admitting: Obstetrics and Gynecology

## 2020-05-16 DIAGNOSIS — R2232 Localized swelling, mass and lump, left upper limb: Secondary | ICD-10-CM

## 2020-09-27 NOTE — Progress Notes (Signed)
75 y.o. G25P1001 Married Caucasian female here for annual exam.    Some vaginal dryness.  Using vit E suppositories, and this is helping.   Doing water aerobics.   PCP:   Ramonita Lab, MD  Patient's last menstrual period was 03/23/1994.           Sexually active: Yes.    The current method of family planning is none.    Exercising: Yes.     Smoker:  no  Health Maintenance: Pap:  09-13-18 normal neg HPV, 08/19/17 - normal.  History of abnormal Pap:  yes.  ASCUS, neg HR HPV 08/18/16. MMG: 03-2020 Unilateral Right breast normal-Left mastectomy Colonoscopy:  2018 inflammation BMD:  2021  Result  Osteoporosis, [facility]. TDaP: UTD Gardasil:   no HIV:neg Hep C:neg Screening Labs:  Hb today: PCP, Urine today: PCP   reports that she quit smoking about 16 years ago. Her smoking use included cigarettes. She has a 35.00 pack-year smoking history. She has never used smokeless tobacco. She reports current alcohol use of about 7.0 standard drinks of alcohol per week. She reports that she does not use drugs.  Past Medical History:  Diagnosis Date   Abnormal Pap smear of cervix 2014 and 2018   ASCUS HPV not detected--hx of cryotherapy to cervix in her 20's   ADD (attention deficit disorder)    ADD (attention deficit disorder)    Anemia    Arthritis    fingers   Arthritis    Breast cancer ([place])    Left; Age 27   Bursitis    both hips   Colitis    Genital herpes 03/26/2009   GERD (gastroesophageal reflux disease)    History of chest pain    Related to reflux   HSV-2 (herpes simplex virus 2) infection    Hyperlipidemia    Hypertension    Hypothyroidism    Osteopenia    Personal history of chemotherapy    Personal history of tobacco use, presenting hazards to health 06/26/2015   S/P appendectomy 1981   Seborrheic dermatitis    Vertigo    x1 - approx 5 yrs ago    Past Surgical History:  Procedure Laterality Date   APPENDECTOMY     AUGMENTATION MAMMAPLASTY  1982   AUGMENTATION  MAMMAPLASTY Bilateral 1751   silicone gel   BREAST IMPLANT EXCHANGE Bilateral 04/21/2016   Procedure: REMOVAL OF BREAST IMPLANTS WITH IMMEDIATE REPLACMENTOF BREAST IMPLANTS;  Surgeon: Youlanda Roys, MD;  Location: [place];  Service: Plastics;  Laterality: Bilateral;   BREAST SURGERY  2005   Breast Implant replaceddue to leaking   BREAST SURGERY Left 2006   due to asymmetry   CARDIAC CATHETERIZATION     2011 at [facility] - reflux related   CERVICAL BIOPSY  W/ LOOP ELECTRODE EXCISION     unsure pathology    CESAREAN SECTION  1985   COLONOSCOPY WITH PROPOFOL N/A 06/17/2015   Procedure: COLONOSCOPY WITH PROPOFOL;  Surgeon: Lucilla Lame, MD;  Location: [place];  Service: Endoscopy;  Laterality: N/A;  PT PREFERS EARLY AM   COLONOSCOPY WITH PROPOFOL N/A 07/16/2016   Procedure: COLONOSCOPY WITH PROPOFOL;  Surgeon: Lucilla Lame, MD;  Location: [place];  Service: Endoscopy;  Laterality: N/A;  requests early as possible   COSMETIC SURGERY     eyelid surgery   DILATION AND CURETTAGE OF UTERUS     ENDOMETRIAL BIOPSY     eye lid surgery     MASTECTOMY  Left    new implant on the Right   MASTOPEXY Right 04/21/2016   Procedure: MASTOPEXY;  Surgeon: Youlanda Roys, MD;  Location: [place];  Service: Plastics;  Laterality: Right;   POLYPECTOMY  06/17/2015   Procedure: POLYPECTOMY;  Surgeon: Lucilla Lame, MD;  Location: [place];  Service: Endoscopy;;   TONSILLECTOMY  child   TUBAL LIGATION      Current Outpatient Medications  Medication Sig Dispense Refill   amphetamine-dextroamphetamine (ADDERALL) 20 MG tablet Take 20 mg by mouth daily.     Ascorbic Acid (VITAMIN C PO) Take by mouth.     azelastine (ASTELIN) 0.1 % nasal spray Place into both nostrils 2 (two) times daily as needed for rhinitis. Use in each nostril as directed     cholecalciferol (VITAMIN D) 1000 UNITS tablet Take 1,000 Units by mouth daily.      cyanocobalamin (,VITAMIN B-12,) 1000 MCG/ML injection USE 1 ML PER INJECTION, IM WEEKLY 10 mL 3   levothyroxine (SYNTHROID) 88 MCG tablet PLEASE SEE ATTACHED FOR DETAILED DIRECTIONS     losartan (COZAAR) 100 MG tablet Take 100 mg by mouth daily.     Melatonin 5 MG TABS Take by mouth at bedtime.      Multiple Vitamin (MULTIVITAMIN) tablet Take 1 tablet by mouth daily.     Omega-3 Fatty Acids (FISH OIL PO) Take by mouth.     OVER THE COUNTER MEDICATION [place] Bone Strength Takes: 2 tablets qhs     S-Adenosylmethionine (SAM-E PO) Take by mouth daily.     UNABLE TO FIND Vitamin E Vaginal supp 2 X weekly     valACYclovir (VALTREX) 500 MG tablet Take one tablet twice daily at onset of outbreak x 3 days 30 tablet 2   mometasone (ELOCON) 0.1 % lotion Apply topically as needed. (Patient not taking: Reported on 10/03/2020)     No current facility-administered medications for this visit.    Family History  Problem Relation Age of Onset   Breast cancer Mother 33   Heart disease Father    Skin cancer Brother        Melanoma   Crohn's disease Brother     Review of Systems  See HPI.    Exam:   BP 116/74 (BP Location: Right Arm, Patient Position: Sitting, Cuff Size: Normal)   Pulse 66   Ht 5' 1.5" (1.562 m)   Wt 130 lb (59 kg)   LMP 03/23/1994   SpO2 99%   BMI 24.17 kg/m     General appearance: alert, cooperative and appears stated age Head: normocephalic, without obvious abnormality, atraumatic Neck: no adenopathy, supple, symmetrical, trachea midline and thyroid normal to inspection and palpation Lungs: clear to auscultation bilaterally Breasts: scars bilateral breasts, bilateral implants, no masses or tenderness, No nipple retraction or dimpling, No nipple discharge or bleeding, No axillary adenopathy Heart: regular rate and rhythm Abdomen: soft, non-tender; no masses, no organomegaly Extremities: extremities normal, atraumatic, no cyanosis or edema Skin: skin color, texture,  turgor normal. No rashes or lesions Lymph nodes: cervical, supraclavicular, and axillary nodes normal. Neurologic: grossly normal  Pelvic: External genitalia:  no lesions              No abnormal inguinal nodes palpated.              Urethra:  normal appearing urethra with no masses, tenderness or lesions              Bartholins and Skenes: normal  Vagina: normal appearing vagina with normal color and discharge, no lesions              Cervix: no lesions              Pap taken: yes. Bimanual Exam:  Uterus:  normal size, contour, position, consistency, mobility, non-tender              Adnexa: no mass, fullness, tenderness              Rectal exam: yes.  Confirms.              Anus:  normal sphincter tone, no lesions  Chaperone was present for exam.  Assessment:    Hx LEEP in early 1990s.   Uncertain pathology. Hx cryotherapy prior to conization. ASCUS pap 2014 and 2018. Cervical cancer screening.  Screening breast exam.  Hx left breast cancer.   Left mastectomy.  Right mastopexy. Bilateral breast implants. Pelvic exam with abnormal finding of atrophy. Rectal exam. Osteoporosis.  Endocrinology managing. HSV. Elevated A1C.   Plan: Mammogram screening discussed. Self breast awareness reviewed. Pap and HR HPV as above. Guidelines for Calcium, Vitamin D, regular exercise program including cardiovascular and weight bearing exercise. Cooking oil discussed for hydration of the vagina.  Refill of Valtrex.   She will see endocrinologist about her osteoporosis.  Will try to get a copy of her bone density from 2021. Follow up annually and prn.    After visit summary provided.   20 min  total time was spent for this patient encounter, including preparation, face-to-face counseling with the patient, coordination of care, and documentation of the encounter.

## 2020-09-30 ENCOUNTER — Encounter: Payer: Self-pay | Admitting: Obstetrics and Gynecology

## 2020-10-03 ENCOUNTER — Ambulatory Visit: Payer: Federal, State, Local not specified - PPO | Admitting: Obstetrics and Gynecology

## 2020-10-03 ENCOUNTER — Ambulatory Visit (INDEPENDENT_AMBULATORY_CARE_PROVIDER_SITE_OTHER): Payer: Federal, State, Local not specified - PPO | Admitting: Obstetrics and Gynecology

## 2020-10-03 ENCOUNTER — Other Ambulatory Visit (HOSPITAL_COMMUNITY)
Admission: RE | Admit: 2020-10-03 | Discharge: 2020-10-03 | Disposition: A | Discharge status: Home / Self Care | Payer: Federal, State, Local not specified - PPO | Source: Ambulatory Visit | Attending: Obstetrics and Gynecology | Admitting: Obstetrics and Gynecology

## 2020-10-03 ENCOUNTER — Other Ambulatory Visit: Payer: Self-pay

## 2020-10-03 ENCOUNTER — Encounter: Payer: Self-pay | Admitting: Obstetrics and Gynecology

## 2020-10-03 VITALS — BP 116/74 | HR 66 | Ht 61.5 in | Wt 130.0 lb

## 2020-10-03 DIAGNOSIS — Z124 Encounter for screening for malignant neoplasm of cervix: Secondary | ICD-10-CM

## 2020-10-03 DIAGNOSIS — Z853 Personal history of malignant neoplasm of breast: Secondary | ICD-10-CM | POA: Diagnosis not present

## 2020-10-03 DIAGNOSIS — A6009 Herpesviral infection of other urogenital tract: Secondary | ICD-10-CM

## 2020-10-03 DIAGNOSIS — Z01411 Encounter for gynecological examination (general) (routine) with abnormal findings: Secondary | ICD-10-CM

## 2020-10-03 DIAGNOSIS — Z008 Encounter for other general examination: Secondary | ICD-10-CM

## 2020-10-03 DIAGNOSIS — Z1239 Encounter for other screening for malignant neoplasm of breast: Secondary | ICD-10-CM

## 2020-10-03 DIAGNOSIS — N952 Postmenopausal atrophic vaginitis: Secondary | ICD-10-CM | POA: Diagnosis not present

## 2020-10-03 MED ORDER — VALACYCLOVIR HCL 500 MG PO TABS: ORAL_TABLET | ORAL | 2 refills | Status: AC

## 2020-10-03 NOTE — Patient Instructions (Signed)

## 2020-10-09 LAB — CYTOLOGY - PAP: Diagnosis: NEGATIVE

## 2020-10-16 ENCOUNTER — Other Ambulatory Visit: Payer: Self-pay | Admitting: Obstetrics and Gynecology

## 2020-10-16 DIAGNOSIS — A6009 Herpesviral infection of other urogenital tract: Secondary | ICD-10-CM

## 2020-10-26 ENCOUNTER — Emergency Department (HOSPITAL_COMMUNITY): Payer: Federal, State, Local not specified - PPO

## 2020-10-26 ENCOUNTER — Emergency Department (HOSPITAL_COMMUNITY)
Admission: EM | Admit: 2020-10-26 | Discharge: 2020-10-27 | Disposition: A | Discharge status: Home / Self Care | Payer: Federal, State, Local not specified - PPO | Attending: Emergency Medicine | Admitting: Emergency Medicine

## 2020-10-26 DIAGNOSIS — I2510 Atherosclerotic heart disease of native coronary artery without angina pectoris: Secondary | ICD-10-CM | POA: Insufficient documentation

## 2020-10-26 DIAGNOSIS — R339 Retention of urine, unspecified: Secondary | ICD-10-CM | POA: Diagnosis present

## 2020-10-26 DIAGNOSIS — E039 Hypothyroidism, unspecified: Secondary | ICD-10-CM | POA: Diagnosis not present

## 2020-10-26 DIAGNOSIS — Z79899 Other long term (current) drug therapy: Secondary | ICD-10-CM | POA: Diagnosis not present

## 2020-10-26 DIAGNOSIS — Z87891 Personal history of nicotine dependence: Secondary | ICD-10-CM | POA: Insufficient documentation

## 2020-10-26 DIAGNOSIS — I10 Essential (primary) hypertension: Secondary | ICD-10-CM | POA: Insufficient documentation

## 2020-10-26 DIAGNOSIS — Z853 Personal history of malignant neoplasm of breast: Secondary | ICD-10-CM | POA: Insufficient documentation

## 2020-10-26 DIAGNOSIS — R338 Other retention of urine: Secondary | ICD-10-CM

## 2020-10-26 DIAGNOSIS — K5900 Constipation, unspecified: Secondary | ICD-10-CM | POA: Diagnosis not present

## 2020-10-26 LAB — URINALYSIS, ROUTINE W REFLEX MICROSCOPIC
Bilirubin Urine: NEGATIVE
Glucose, UA: NEGATIVE mg/dL
Hgb urine dipstick: NEGATIVE
Ketones, ur: 5 mg/dL — AB
Leukocytes,Ua: NEGATIVE
Nitrite: NEGATIVE
Protein, ur: NEGATIVE mg/dL
Specific Gravity, Urine: 1.009 (ref 1.005–1.030)
pH: 7 (ref 5.0–8.0)

## 2020-10-26 LAB — CBC
HCT: 35.8 % — ABNORMAL LOW (ref 36.0–46.0)
Hemoglobin: 12 g/dL (ref 12.0–15.0)
MCH: 31.2 pg (ref 26.0–34.0)
MCHC: 33.5 g/dL (ref 30.0–36.0)
MCV: 93 fL (ref 80.0–100.0)
Platelets: 285 10*3/uL (ref 150–400)
RBC: 3.85 MIL/uL — ABNORMAL LOW (ref 3.87–5.11)
RDW: 12.6 % (ref 11.5–15.5)
WBC: 13.2 10*3/uL — ABNORMAL HIGH (ref 4.0–10.5)
nRBC: 0 % (ref 0.0–0.2)

## 2020-10-26 LAB — BASIC METABOLIC PANEL
Anion gap: 7 (ref 5–15)
BUN: 16 mg/dL (ref 8–23)
CO2: 26 mmol/L (ref 22–32)
Calcium: 9.8 mg/dL (ref 8.9–10.3)
Chloride: 103 mmol/L (ref 98–111)
Creatinine, Ser: 0.69 mg/dL (ref 0.44–1.00)
GFR, Estimated: >60 mL/min (ref >60)
Glucose, Bld: 109 mg/dL — ABNORMAL HIGH (ref 70–99)
Potassium: 3.9 mmol/L (ref 3.5–5.1)
Sodium: 136 mmol/L (ref 135–145)

## 2020-10-26 NOTE — ED Triage Notes (Signed)
Pt reported to ED with c/o urinary retention since this AM and severe constipation x1 week. Expresses rectal pain and swelling, has taken OTC medications with no relief in symptoms. State she attempted to digitally excavate with no success.

## 2020-10-26 NOTE — ED Provider Notes (Signed)
Emergency Medicine Provider Triage Evaluation Note  Adriana Hicks , a 75 y.o. female  was evaluated in triage.  Pt complains of urinary retention and constipation.  Patient states that she started having hard stools this week and her constipation got worse and worse until today she was unable to urinate.  She has tried MiraLAX without relief of her symptoms.  She complains of severe urgency to urinate and bladder distention.  She is on been unable to urinate since this morning when she woke up.  She has never had anything like this before.  She denies fevers or chills..  Review of Systems  Positive: Abdominal distention and pain Negative: Fever chills or vomiting  Physical Exam  LMP 03/23/1994  Gen:   Awake, no distress   Resp:  Normal effort  MSK:   Moves extremities without difficulty  Other:  Bladder distension  Medical Decision Making  Medically screening exam initiated at 8:19 PM.  Appropriate orders placed.  Miyona Hyman Gleed was informed that the remainder of the evaluation will be completed by another provider, this initial triage assessment does not replace that evaluation, and the importance of remaining in the ED until their evaluation is complete.   BLADDER CATHETERIZATION  Date/Time: 10/26/2020 8:49 PM Performed by: Margarita Mail, PA-C Authorized by: Margarita Mail, PA-C   Consent:    Consent obtained:  Verbal   Consent given by:  Patient   Risks, benefits, and alternatives were discussed: yes     Risks discussed:  False passage, urethral injury, incomplete procedure, pain and infection   Alternatives discussed:  No treatment Universal protocol:    Patient identity confirmed:  Hospital-assigned identification number Pre-procedure details:    Procedure purpose:  Therapeutic   Preparation: Patient was prepped and draped in usual sterile fashion   Anesthesia:    Anesthesia method:  None Procedure details:    Provider performed due to:  Nurse  unavailable   Catheter insertion:  Temporary indwelling   Catheter type:  Foley   Catheter size:  16 Fr   Bladder irrigation: no     Number of attempts:  1   Urine characteristics:  Clear Post-procedure details:    Procedure completion:  Tolerated well, no immediate complications Comments:     Urine output 1100 ml    Margarita Mail, PA-C 10/26/20 2051    Regan Lemming, MD 10/26/20 [id number]

## 2020-10-27 MED ORDER — POLYETHYLENE GLYCOL 3350 17 G PO PACK: 17.0000 g | PACK | Freq: Two times a day (BID) | ORAL | 0 refills | Status: AC

## 2020-10-27 MED ORDER — SORBITOL 70 % SOLN
960.0000 mL | TOPICAL_OIL | Freq: Once | ORAL | Status: AC
  Administered 2020-10-27: 960 mL via RECTAL
  Filled 2020-10-27: qty 473

## 2020-10-27 MED ORDER — ACETAMINOPHEN 325 MG PO TABS
650.0000 mg | ORAL_TABLET | Freq: Once | ORAL | Status: AC
  Administered 2020-10-27: 650 mg via ORAL
  Filled 2020-10-27: qty 2

## 2020-10-27 MED ORDER — SORBITOL 70 % SOLN
960.0000 mL | TOPICAL_OIL | Freq: Once | ORAL | Status: DC
  Filled 2020-10-27 (×2): qty 473

## 2020-10-27 NOTE — Discharge Instructions (Addendum)
Start taking the medications for the constipation.  Follow-up with your doctor to discuss a bowel regimen.  Follow-up with the urologist determine when it is appropriate to remove the Foley catheter  Try increasing fiber in your diet.  Sure to stay well-hydrated

## 2020-10-27 NOTE — ED Notes (Signed)
Pt on BSC. Had total of 2 BM. Large in size. Pt verbalized she feels better. MD made aware

## 2020-10-27 NOTE — ED Provider Notes (Signed)
[facility] EMERGENCY DEPARTMENT Provider Note   CSN: [id number] Arrival date & time: 10/26/20  1923     History Chief Complaint  Patient presents with  . Urinary Retention  . Constipation    Adriana Hicks is a 75 y.o. female.  HPI  Patient presents to the ED for evaluation of constipation and urinary retention.  Patient states she has been having hard stools this past week.  She constantly feels like she has to go to the bathroom but has not had a normal bowel movement.  She has tried MiraLAX.  She also tried manual disimpaction herself.  Patient then started having issues with being unable to urinate.  Patient states since yesterday morning she had not been able to urinate.  Patient initially presented to the emergency room late last evening.  She was evaluated in triage and had a Foley catheter placed.  Patient put out over 1100 cc of urine.  Patient states she is feeling better with regards to the urinary retention but she still has persistent inability to have a bowel movement.  She has constant urge and feels very uncomfortable.  No fevers or chills.  No vomiting.  Past Medical History:  Diagnosis Date  . Abnormal Pap smear of cervix 2014 and 2018   ASCUS HPV not detected--hx of cryotherapy to cervix in her 20's  . ADD (attention deficit disorder)   . ADD (attention deficit disorder)   . Anemia   . Arthritis    fingers  . Arthritis   . Breast cancer ([place])    Left; Age 32  . Bursitis    both hips  . Colitis   . Genital herpes 03/26/2009  . GERD (gastroesophageal reflux disease)   . History of chest pain    Related to reflux  . HSV-2 (herpes simplex virus 2) infection   . Hyperlipidemia   . Hypertension   . Hypothyroidism   . Osteopenia   . Personal history of chemotherapy   . Personal history of tobacco use, presenting hazards to health 06/26/2015  . S/P appendectomy 1981  . Seborrheic dermatitis   . Vertigo    x1 - approx 5 yrs ago     Patient Active Problem List   Diagnosis Date Noted  . 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 Sprain and strain of hip and thigh 05/27/2018  . Osteoporosis 03/03/2018  . Aortic atherosclerosis ([place]) 01/19/2017  . Lymphocytic colitis 12/07/2016  . Noninfectious diarrhea   . Diarrhea of presumed infectious origin 06/26/2016  . Coronary artery disease involving native coronary artery of native heart without angina pectoris 07/22/2015  . Personal history of tobacco use, presenting hazards to health 06/26/2015  . Rectal polyp   . History of smoking 30 or more pack years 05/17/2015  . Personal history of nicotine dependence 05/17/2015  . Constipation 02/13/2015  . Cramp in lower leg 02/12/2015  . Difficulty hearing 02/12/2015  . Acid reflux 02/12/2015  . B12 deficiency 02/12/2015  . Abnormal blood sugar 02/12/2015  . Hearing loss 02/12/2015  . Hypothyroidism 09/10/2014  . Insomnia 09/05/2014  . Other specified postprocedural states 04/18/2013  . S/P breast reconstruction, left 04/18/2013  . Personal history of malignant neoplasm of breast 06/20/2012  . History of abnormal Pap smear 06/20/2012  . Benign hypertension 07/01/2009  . Essential (primary) hypertension 07/01/2009  . Genital herpes 03/26/2009  . 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 ADD (attention deficit hyperactivity disorder, inattentive type)  02/21/2009  . Hypercholesterolemia without hypertriglyceridemia 02/21/2009  . Allergic rhinitis 02/21/2009    Past Surgical History:  Procedure Laterality Date  . APPENDECTOMY    . AUGMENTATION MAMMAPLASTY  1982  . AUGMENTATION MAMMAPLASTY Bilateral 99991111   silicone gel  . BREAST IMPLANT EXCHANGE Bilateral 04/21/2016   Procedure: REMOVAL OF BREAST IMPLANTS WITH IMMEDIATE REPLACMENTOF BREAST IMPLANTS;  Surgeon: Youlanda Roys, MD;  Location: [facility];  Service: Plastics;  Laterality: Bilateral;  . BREAST SURGERY   2005   Breast Implant replaceddue to leaking  . BREAST SURGERY Left 2006   due to asymmetry  . CARDIAC CATHETERIZATION     2011 at [facility] - reflux related  . CERVICAL BIOPSY  W/ LOOP ELECTRODE EXCISION     unsure pathology   . CESAREAN SECTION  1985  . COLONOSCOPY WITH PROPOFOL N/A 06/17/2015   Procedure: COLONOSCOPY WITH PROPOFOL;  Surgeon: Lucilla Lame, MD;  Location: [facility];  Service: Endoscopy;  Laterality: N/A;  PT PREFERS EARLY AM  . COLONOSCOPY WITH PROPOFOL N/A 07/16/2016   Procedure: COLONOSCOPY WITH PROPOFOL;  Surgeon: Lucilla Lame, MD;  Location: [place];  Service: Endoscopy;  Laterality: N/A;  requests early as possible  . COSMETIC SURGERY     eyelid surgery  . DILATION AND CURETTAGE OF UTERUS    . ENDOMETRIAL BIOPSY    . eye lid surgery    . MASTECTOMY Left    new implant on the Right  . MASTOPEXY Right 04/21/2016   Procedure: MASTOPEXY;  Surgeon: Youlanda Roys, MD;  Location: [place];  Service: Plastics;  Laterality: Right;  . POLYPECTOMY  06/17/2015   Procedure: POLYPECTOMY;  Surgeon: Lucilla Lame, MD;  Location: [place];  Service: Endoscopy;;  . TONSILLECTOMY  child  . TUBAL LIGATION       OB History     Gravida  1   Para  1   Term  1   Preterm      AB      Living  1      SAB      IAB      Ectopic      Multiple      Live Births  1           Family History  Problem Relation Age of Onset  . Breast cancer Mother 55  . Heart disease Father   . Skin cancer Brother        Melanoma  . Crohn's disease Brother     Social History   Tobacco Use  . Smoking status: Former    Packs/day: 1.00    Years: 35.00    Pack years: 35.00    Types: Cigarettes    Quit date: 03/22/2004    Years since quitting: 16.6  . Smokeless tobacco: Never  Vaping Use  . Vaping Use: Never used  Substance Use Topics  . Alcohol use: Yes    Alcohol/week: 7.0 standard drinks    Types: 7 Glasses  of wine per week  . Drug use: No    Home Medications Prior to Admission medications   Medication Sig Start Date End Date Taking? Authorizing Provider  polyethylene glycol (MIRALAX) 17 g packet Take 17 g by mouth 2 (two) times daily for 14 days. 10/27/20 11/10/20 Yes Dorie Rank, MD  amphetamine-dextroamphetamine (ADDERALL) 20 MG tablet Take 20 mg by mouth daily.    [provider]  Ascorbic Acid (VITAMIN C PO) Take  by mouth.    [provider]  azelastine (ASTELIN) 0.1 % nasal spray Place into both nostrils 2 (two) times daily as needed for rhinitis. Use in each nostril as directed    [provider]  cholecalciferol (VITAMIN D) 1000 UNITS tablet Take 1,000 Units by mouth daily.    [provider]  cyanocobalamin (,VITAMIN B-12,) 1000 MCG/ML injection USE 1 ML PER INJECTION, IM WEEKLY 05/28/15   Margarita Rana, MD  levothyroxine (SYNTHROID) 88 MCG tablet PLEASE SEE ATTACHED FOR DETAILED DIRECTIONS 09/10/20   [provider]  losartan (COZAAR) 100 MG tablet Take 100 mg by mouth daily.    [provider]  Melatonin 5 MG TABS Take by mouth at bedtime.     [provider]  mometasone (ELOCON) 0.1 % lotion Apply topically as needed. Patient not taking: Reported on 10/03/2020    [provider]  Multiple Vitamin (MULTIVITAMIN) tablet Take 1 tablet by mouth daily.    [provider]  Omega-3 Fatty Acids (FISH OIL PO) Take by mouth.    [provider]  OVER THE COUNTER MEDICATION New Chapter Bone Strength Takes: 2 tablets qhs    [provider]  S-Adenosylmethionine (SAM-E PO) Take by mouth daily.    [provider]  UNABLE TO FIND Vitamin E Vaginal supp 2 X weekly    [provider]  valACYclovir (VALTREX) 500 MG tablet Take one tablet twice daily at onset of outbreak x 3 days 10/03/20   Nunzio Cobbs, MD    Allergies    Sulfamethoxazole  Review of Systems   Review of  Systems  All other systems reviewed and are negative.  Physical Exam Updated Vital Signs BP (!) 137/49   Pulse 74   Temp 98.4 F (36.9 C) (Oral)   Resp (!) 21   Ht 1.575 m ('5\' 2"'$ )   Wt 58.5 kg   LMP 03/23/1994   SpO2 97%   BMI 23.59 kg/m   Physical Exam Vitals and nursing note reviewed.  Constitutional:      General: She is not in acute distress.    Appearance: She is well-developed.  HENT:     Head: Normocephalic and atraumatic.     Right Ear: External ear normal.     Left Ear: External ear normal.  Eyes:     General: No scleral icterus.       Right eye: No discharge.        Left eye: No discharge.     Conjunctiva/sclera: Conjunctivae normal.  Neck:     Trachea: No tracheal deviation.  Cardiovascular:     Rate and Rhythm: Normal rate and regular rhythm.  Pulmonary:     Effort: Pulmonary effort is normal. No respiratory distress.     Breath sounds: Normal breath sounds. No stridor. No wheezing or rales.  Abdominal:     General: Bowel sounds are normal. There is no distension.     Palpations: Abdomen is soft.     Tenderness: There is no abdominal tenderness. There is no guarding or rebound.  Genitourinary:    Comments: Large amount of soft stool in the rectal vault, no mass appreciated, no gross blood noted Musculoskeletal:        General: No tenderness or deformity.     Cervical back: Neck supple.  Skin:    General: Skin is warm and dry.     Findings: No rash.  Neurological:     General: No focal deficit present.  Mental Status: She is alert.     Cranial Nerves: No cranial nerve deficit (no facial droop, extraocular movements intact, no slurred speech).     Sensory: No sensory deficit.     Motor: No abnormal muscle tone or seizure activity.     Coordination: Coordination normal.  Psychiatric:        Mood and Affect: Mood normal.    ED Results / Procedures / Treatments   Labs (all labs ordered are listed, but only abnormal results are displayed) Labs  Reviewed  URINALYSIS, ROUTINE W REFLEX MICROSCOPIC - Abnormal; Notable for the following components:      Result Value   APPearance HAZY (*)    Ketones, ur 5 (*)    All other components within normal limits  CBC - Abnormal; Notable for the following components:   WBC 13.2 (*)    RBC 3.85 (*)    HCT 35.8 (*)    All other components within normal limits  BASIC METABOLIC PANEL - Abnormal; Notable for the following components:   Glucose, Bld 109 (*)    All other components within normal limits    EKG None  Radiology CT Renal Stone Study  Result Date: 10/26/2020 CLINICAL DATA:  Hematuria, unknown cause.  Urinary retention. EXAM: CT ABDOMEN AND PELVIS WITHOUT CONTRAST TECHNIQUE: Multidetector CT imaging of the abdomen and pelvis was performed following the standard protocol without IV contrast. COMPARISON:  None. FINDINGS: Lower chest: Lung bases are clear. No effusions. Heart is normal size. Hepatobiliary: No focal hepatic abnormality. Gallbladder unremarkable. Pancreas: No focal abnormality or ductal dilatation. Spleen: No focal abnormality.  Normal size. Adrenals/Urinary Tract: No adrenal abnormality. No focal renal abnormality. No stones or hydronephrosis. Urinary bladder is unremarkable. Stomach/Bowel: Large stool burden throughout the colon. Stomach, large and small bowel grossly unremarkable. Vascular/Lymphatic: Aortic atherosclerosis. No evidence of aneurysm or adenopathy. Reproductive: No mass. Other: No free fluid or free air. Musculoskeletal: No acute bony abnormality. IMPRESSION: No renal or ureteral stones.  No hydronephrosis. Aortic atherosclerosis. Large stool burden. Electronically Signed   By: Rolm Baptise M.D.   On: 10/26/2020 22:15    Procedures Procedures   Medications Ordered in ED Medications  acetaminophen (TYLENOL) tablet 650 mg (650 mg Oral Given 10/27/20 0850)  sorbitol, milk of mag, mineral oil, glycerin (SMOG) enema (960 mLs Rectal Given 10/27/20 0908)    ED Course   I have reviewed the triage vital signs and the nursing notes.  Pertinent labs & imaging results that were available during my care of the patient were reviewed by me and considered in my medical decision making (see chart for details).  Clinical Course as of 10/27/20 1053  Sun Oct 27, 2020  1052 Leukocytosis noted but no signs of infection on CT scan.  Metabolic panel unremarkable.  Urinalysis without signs of infection [JK]    Clinical Course User Index [JK] Dorie Rank, MD   MDM Rules/Calculators/A&P                          Patient's ED work-up did show elevated white blood cell count.  Electrolyte panel and urinalysis are unremarkable.  Patient also had a CT renal stone study initiated by the triage provider.  No signs of hydronephrosis or renal or ureteral stones.  No signs of colitis or diverticulitis but she does have large stool burden noted on CT. Stool is soft on rectal exam, attempted disimpaction but not able to extract large amount of stool.  Will proceed with enema for symptomatic relief  Patient had good results after an enema.  She is feeling much better.  She still is having some rectal irritation but I think that is associated with her constipation and attempts at disimpaction at home.  We will have her continue on a bowel regimen.  Continue the Foley catheter and follow-up with urology. Final Clinical Impression(s) / ED Diagnoses Final diagnoses:  Constipation, unspecified constipation type  Acute urinary retention    Rx / DC Orders ED Discharge Orders          Ordered    polyethylene glycol (MIRALAX) 17 g packet  2 times daily        10/27/20 1051             Dorie Rank, MD 10/27/20 1053

## 2020-11-03 NOTE — Progress Notes (Signed)
11/04/2020 11:21 AM   Adriana Hicks Jul 25, 1945 [id number]  Referring provider: Adin Hector, MD [facility] [place],  [place] [address]  Chief Complaint  Patient presents with   Urinary Retention    HPI: Adriana Hicks is a 75 y.o. female who presents for follow-up of recent ED visit for urinary retention.  Seen at [facility] ED 10/26/2020 complaining of severe constipation for 1 week with subsequent development of urinary retention Foley catheter placed with 1100 mL of urine obtained Will disimpaction was attempted however significant stool volume removed was minimal and she underwent enema with good results Urinalysis was unremarkable Noncontrast CT abdomen/pelvis was performed which showed no GU abnormalities No prior history of urinary retention or other urologic problems Since her ED visit states her constipation has resolved   PMH: Past Medical History:  Diagnosis Date   Abnormal Pap smear of cervix 2014 and 2018   ASCUS HPV not detected--hx of cryotherapy to cervix in her 20's   ADD (attention deficit disorder)    ADD (attention deficit disorder)    Anemia    Arthritis    fingers   Arthritis    Breast cancer ([place])    Left; Age 12   Bursitis    both hips   Colitis    Genital herpes 03/26/2009   GERD (gastroesophageal reflux disease)    History of chest pain    Related to reflux   HSV-2 (herpes simplex virus 2) infection    Hyperlipidemia    Hypertension    Hypothyroidism    Osteopenia    Personal history of chemotherapy    Personal history of tobacco use, presenting hazards to health 06/26/2015   S/P appendectomy 1981   Seborrheic dermatitis    Vertigo    x1 - approx 5 yrs ago    Surgical History: Past Surgical History:  Procedure Laterality Date   APPENDECTOMY     AUGMENTATION MAMMAPLASTY  1982   AUGMENTATION MAMMAPLASTY Bilateral 99991111   silicone gel   BREAST IMPLANT EXCHANGE Bilateral 04/21/2016    Procedure: REMOVAL OF BREAST IMPLANTS WITH IMMEDIATE REPLACMENTOF BREAST IMPLANTS;  Surgeon: Youlanda Roys, MD;  Location: [facility];  Service: Plastics;  Laterality: Bilateral;   BREAST SURGERY  2005   Breast Implant replaceddue to leaking   BREAST SURGERY Left 2006   due to asymmetry   CARDIAC CATHETERIZATION     2011 at [facility] - reflux related   CERVICAL BIOPSY  W/ LOOP ELECTRODE EXCISION     unsure pathology    CESAREAN SECTION  1985   COLONOSCOPY WITH PROPOFOL N/A 06/17/2015   Procedure: COLONOSCOPY WITH PROPOFOL;  Surgeon: Lucilla Lame, MD;  Location: [facility];  Service: Endoscopy;  Laterality: N/A;  PT PREFERS EARLY AM   COLONOSCOPY WITH PROPOFOL N/A 07/16/2016   Procedure: COLONOSCOPY WITH PROPOFOL;  Surgeon: Lucilla Lame, MD;  Location: [facility] [place];  Service: Endoscopy;  Laterality: N/A;  requests early as possible   COSMETIC SURGERY     eyelid surgery   DILATION AND CURETTAGE OF UTERUS     ENDOMETRIAL BIOPSY     eye lid surgery     MASTECTOMY Left    new implant on the Right   MASTOPEXY Right 04/21/2016   Procedure: MASTOPEXY;  Surgeon: Youlanda Roys, MD;  Location: [place];  Service: Plastics;  Laterality: Right;   POLYPECTOMY  06/17/2015   Procedure: POLYPECTOMY;  Surgeon: Evangeline Gula  Allen Norris, MD;  Location: [place];  Service: Endoscopy;;   TONSILLECTOMY  child   TUBAL LIGATION      Home Medications:  Allergies as of 11/04/2020       Reactions   Sulfamethoxazole Other (See Comments)   Vaginal itching         Medication List        Accurate as of November 04, 2020 11:21 AM. If you have any questions, ask your nurse or doctor.          amphetamine-dextroamphetamine 20 MG tablet Commonly known as: ADDERALL Take 20 mg by mouth daily.   azelastine 0.1 % nasal spray Commonly known as: ASTELIN Place into both nostrils 2 (two) times daily as needed for rhinitis. Use in each nostril as  directed   cholecalciferol 1000 units tablet Commonly known as: VITAMIN D Take 1,000 Units by mouth daily.   cyanocobalamin 1000 MCG/ML injection Commonly known as: (VITAMIN B-12) USE 1 ML PER INJECTION, IM WEEKLY What changed: See the new instructions.   Finasteride-Minoxidil 0.1-7 % Soln Apply 60 mLs topically at bedtime.   FISH OIL PO Take by mouth daily at 2 PM. [employer]lean's Fishoil 1 tsp   levothyroxine 88 MCG tablet Commonly known as: SYNTHROID PLEASE SEE ATTACHED FOR DETAILED DIRECTIONS   losartan 100 MG tablet Commonly known as: COZAAR Take 100 mg by mouth daily.   melatonin 5 MG Tabs Take by mouth at bedtime.   mometasone 0.1 % lotion Commonly known as: ELOCON Apply topically as needed.   multivitamin tablet Take 1 tablet by mouth daily.   OVER THE COUNTER MEDICATION New Chapter Bone Strength Takes: 3 tablets qhs   polyethylene glycol 17 g packet Commonly known as: MiraLax Take 17 g by mouth 2 (two) times daily for 14 days.   SAM-E PO Take by mouth daily.   UNABLE TO FIND Place 20 mg vaginally 2 (two) times a week. Isaiah Blakes Key-E Vaginal Suppositories   valACYclovir 500 MG tablet Commonly known as: VALTREX Take one tablet twice daily at onset of outbreak x 3 days   VITAMIN C PO Take 1,000 mg by mouth.        Allergies:  Allergies  Allergen Reactions   Sulfamethoxazole Other (See Comments)    Vaginal itching     Family History: Family History  Problem Relation Age of Onset   Breast cancer Mother 63   Heart disease Father    Skin cancer Brother        Melanoma   Crohn's disease Brother     Social History:  reports that she quit smoking about 16 years ago. Her smoking use included cigarettes. She has a 35.00 pack-year smoking history. She has never used smokeless tobacco. She reports current alcohol use of about 7.0 standard drinks per week. She reports that she does not use drugs.   Physical Exam: BP 130/69 (BP Location: Right Arm,  Patient Position: Sitting, Cuff Size: Normal)   Pulse 66   Ht '5\' 2"'$  (1.575 m)   Wt 130 lb (59 kg)   LMP 03/23/1994   BMI 23.78 kg/m   Constitutional:  Alert and oriented, No acute distress. HEENT: [place] AT, moist mucus membranes.  Trachea midline, no masses. Cardiovascular: No clubbing, cyanosis, or edema. Respiratory: Normal respiratory effort, no increased work of breathing.   Assessment & Plan:    1.  Acute urinary retention Secondary to constipation Catheter removed this morning Follow-up appointment this afternoon for bladder scan If adequately emptying follow-up  as needed for any recurrent symptoms   Abbie Sons, MD  [facility] [address] [place], [place] [address] [phone]

## 2020-11-04 ENCOUNTER — Ambulatory Visit: Payer: Federal, State, Local not specified - PPO | Admitting: Urology

## 2020-11-04 ENCOUNTER — Other Ambulatory Visit: Payer: Self-pay

## 2020-11-04 ENCOUNTER — Ambulatory Visit (INDEPENDENT_AMBULATORY_CARE_PROVIDER_SITE_OTHER): Payer: Federal, State, Local not specified - PPO | Admitting: Physician Assistant

## 2020-11-04 ENCOUNTER — Encounter: Payer: Self-pay | Admitting: Urology

## 2020-11-04 VITALS — BP 130/69 | HR 66 | Ht 62.0 in | Wt 130.0 lb

## 2020-11-04 DIAGNOSIS — R339 Retention of urine, unspecified: Secondary | ICD-10-CM

## 2020-11-04 LAB — BLADDER SCAN AMB NON-IMAGING: Scan Result: 20 mL

## 2020-11-04 NOTE — Progress Notes (Signed)
Afternoon follow-up  Patient returned to clinic this afternoon for repeat PVR. She has been able to void without difficulty. PVR 80m.  Results for orders placed or performed in visit on 11/04/20  Bladder Scan (Post Void Residual) in office  Result Value Ref Range   Scan Result 20 mL   Voiding trial passed. Counseled patient to follow up as needed for recurrent symptoms.  Follow up: Return if symptoms worsen or fail to improve.

## 2020-11-04 NOTE — Progress Notes (Signed)
Catheter Removal  Patient is present today for a catheter removal. 25m of water was drained from the balloon. A 16FR foley cath was removed from the bladder no complications were noted. Patient tolerated well.  Performed by: OGordy Clement CMA   Follow up/ Additional notes: RTC this afternoon for PVR.

## 2021-03-26 ENCOUNTER — Ambulatory Visit: Payer: Federal, State, Local not specified - PPO | Admitting: Obstetrics and Gynecology

## 2021-03-26 ENCOUNTER — Other Ambulatory Visit: Payer: Self-pay | Admitting: Obstetrics and Gynecology

## 2021-03-26 DIAGNOSIS — Z1231 Encounter for screening mammogram for malignant neoplasm of breast: Secondary | ICD-10-CM

## 2021-03-26 NOTE — Progress Notes (Deleted)
GYNECOLOGY  VISIT   HPI: 76 y.o.   Married  Caucasian  female   G1P1001 with Patient's last menstrual period was 03/23/1994.   here for     GYNECOLOGIC HISTORY: Patient's last menstrual period was 03/23/1994. Contraception:  PMP Menopausal hormone therapy:  *** Last mammogram:   04-18-20 Unilateral Right breast normal-Left mastectomy/BiRads1 Last pap smear:  10-03-20 Neg, 09-13-18 Neg:Neg HR HPV, 08-19-17 Neg        OB History     Gravida  1   Para  1   Term  1   Preterm      AB      Living  1      SAB      IAB      Ectopic      Multiple      Live Births  1              Patient Active Problem List   Diagnosis Date Noted   Ductal carcinoma in situ (DCIS) of left breast 01/15/2020   Sprain and strain of hip and thigh 05/27/2018   Osteoporosis 03/03/2018   Aortic atherosclerosis ([place]) 01/19/2017   Lymphocytic colitis 12/07/2016   Noninfectious diarrhea    Diarrhea of presumed infectious origin 06/26/2016   Coronary artery disease involving native coronary artery of native heart without angina pectoris 07/22/2015   Personal history of tobacco use, presenting hazards to health 06/26/2015   Rectal polyp    History of smoking 30 or more pack years 05/17/2015   Personal history of nicotine dependence 05/17/2015   Constipation 02/13/2015   Cramp in lower leg 02/12/2015   Difficulty hearing 02/12/2015   Acid reflux 02/12/2015   B12 deficiency 02/12/2015   Abnormal blood sugar 02/12/2015   Hearing loss 02/12/2015   Hypothyroidism 09/10/2014   Insomnia 09/05/2014   Other specified postprocedural states 04/18/2013   S/P breast reconstruction, left 04/18/2013   Personal history of malignant neoplasm of breast 06/20/2012   History of abnormal Pap smear 06/20/2012   Benign hypertension 07/01/2009   Essential (primary) hypertension 07/01/2009   Genital herpes 03/26/2009   Herpesviral infection of urogenital system 03/26/2009   ADD (attention deficit  hyperactivity disorder, 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   Hypercholesterolemia without hypertriglyceridemia 02/21/2009   Allergic rhinitis 02/21/2009    Past Medical History:  Diagnosis Date   Abnormal Pap smear of cervix 2014 and 2018   ASCUS HPV not detected--hx of cryotherapy to cervix in her 20's   ADD (attention deficit disorder)    ADD (attention deficit disorder)    Anemia    Arthritis    fingers   Arthritis    Breast cancer ([place])    Left; Age 45   Bursitis    both hips   Colitis    Genital herpes 03/26/2009   GERD (gastroesophageal reflux disease)    History of chest pain    Related to reflux   HSV-2 (herpes simplex virus 2) infection    Hyperlipidemia    Hypertension    Hypothyroidism    Osteopenia    Personal history of chemotherapy    Personal history of tobacco use, presenting hazards to health 06/26/2015   S/P appendectomy 1981   Seborrheic dermatitis    Vertigo    x1 - approx 5 yrs ago    Past Surgical History:  Procedure Laterality Date   APPENDECTOMY     AUGMENTATION MAMMAPLASTY  1982   AUGMENTATION MAMMAPLASTY Bilateral 4782   silicone gel  BREAST IMPLANT EXCHANGE Bilateral 04/21/2016   Procedure: REMOVAL OF BREAST IMPLANTS WITH IMMEDIATE REPLACMENTOF BREAST IMPLANTS;  Surgeon: Youlanda Roys, MD;  Location: [place];  Service: Plastics;  Laterality: Bilateral;   BREAST SURGERY  2005   Breast Implant replaceddue to leaking   BREAST SURGERY Left 2006   due to asymmetry   CARDIAC CATHETERIZATION     2011 at [facility] - reflux related   CERVICAL BIOPSY  W/ LOOP ELECTRODE EXCISION     unsure pathology    CESAREAN SECTION  1985   COLONOSCOPY WITH PROPOFOL N/A 06/17/2015   Procedure: COLONOSCOPY WITH PROPOFOL;  Surgeon: Lucilla Lame, MD;  Location: [place];  Service: Endoscopy;  Laterality: N/A;  PT PREFERS EARLY AM   COLONOSCOPY WITH PROPOFOL N/A 07/16/2016   Procedure: COLONOSCOPY WITH PROPOFOL;  Surgeon: Lucilla Lame, MD;  Location: [place];  Service: Endoscopy;  Laterality: N/A;  requests early as possible   COSMETIC SURGERY     eyelid surgery   DILATION AND CURETTAGE OF UTERUS     ENDOMETRIAL BIOPSY     eye lid surgery     MASTECTOMY Left    new implant on the Right   MASTOPEXY Right 04/21/2016   Procedure: MASTOPEXY;  Surgeon: Youlanda Roys, MD;  Location: [place];  Service: Plastics;  Laterality: Right;   POLYPECTOMY  06/17/2015   Procedure: POLYPECTOMY;  Surgeon: Lucilla Lame, MD;  Location: [place];  Service: Endoscopy;;   TONSILLECTOMY  child   TUBAL LIGATION      Current Outpatient Medications  Medication Sig Dispense Refill   amphetamine-dextroamphetamine (ADDERALL) 20 MG tablet Take 20 mg by mouth daily.     Ascorbic Acid (VITAMIN C PO) Take 1,000 mg by mouth.     azelastine (ASTELIN) 0.1 % nasal spray Place into both nostrils 2 (two) times daily as needed for rhinitis. Use in each nostril as directed     cholecalciferol (VITAMIN D) 1000 UNITS tablet Take 1,000 Units by mouth daily.     cyanocobalamin (,VITAMIN B-12,) 1000 MCG/ML injection USE 1 ML PER INJECTION, IM WEEKLY (Patient taking differently: every 30 (thirty) days.) 10 mL 3   Finasteride-Minoxidil 0.1-7 % SOLN Apply 60 mLs topically at bedtime.     levothyroxine (SYNTHROID) 88 MCG tablet PLEASE SEE ATTACHED FOR DETAILED DIRECTIONS     losartan (COZAAR) 100 MG tablet Take 100 mg by mouth daily.     Melatonin 5 MG TABS Take by mouth at bedtime.      mometasone (ELOCON) 0.1 % lotion Apply topically as needed. (Patient not taking: No sig reported)     Multiple Vitamin (MULTIVITAMIN) tablet Take 1 tablet by mouth daily.     Omega-3 Fatty Acids (FISH OIL PO) Take by mouth daily at 2 PM. Barlean's Fishoil 1 tsp     OVER THE COUNTER MEDICATION New Chapter Bone Strength Takes: 3 tablets qhs     S-Adenosylmethionine (SAM-E PO) Take by mouth daily.     UNABLE TO FIND Place 20 mg  vaginally 2 (two) times a week. Isaiah Blakes Key-E Vaginal Suppositories     valACYclovir (VALTREX) 500 MG tablet Take one tablet twice daily at onset of outbreak x 3 days 30 tablet 2   No current facility-administered medications for this visit.     ALLERGIES: Sulfamethoxazole  Family History  Problem Relation Age of Onset   Breast cancer Mother 30   Heart disease Father    Skin cancer  Brother        Melanoma   Crohn's disease Brother     Social History   Socioeconomic History   Marital status: Married    Spouse name: Not on file   Number of children: Not on file   Years of education: Not on file   Highest education level: Not on file  Occupational History   Not on file  Tobacco Use   Smoking status: Former    Packs/day: 1.00    Years: 35.00    Pack years: 35.00    Types: Cigarettes    Quit date: 03/22/2004    Years since quitting: 17.0   Smokeless tobacco: Never  Vaping Use   Vaping Use: Never used  Substance and Sexual Activity   Alcohol use: Yes    Alcohol/week: 7.0 standard drinks    Types: 7 Glasses of wine per week   Drug use: No   Sexual activity: Yes    Partners: Male    Birth control/protection: Surgical    Comment: btl  Other Topics Concern   Not on file  Social History Narrative   Not on file   Social Determinants of Health   Financial Resource Strain: Not on file  Food Insecurity: Not on file  Transportation Needs: Not on file  Physical Activity: Not on file  Stress: Not on file  Social Connections: Not on file  Intimate Partner Violence: Not on file    Review of Systems  PHYSICAL EXAMINATION:    LMP 03/23/1994     General appearance: alert, cooperative and appears stated age Head: Normocephalic, without obvious abnormality, atraumatic Neck: no adenopathy, supple, symmetrical, trachea midline and thyroid normal to inspection and palpation Lungs: clear to auscultation bilaterally Breasts: normal appearance, no masses or tenderness, No  nipple retraction or dimpling, No nipple discharge or bleeding, No axillary or supraclavicular adenopathy Heart: regular rate and rhythm Abdomen: soft, non-tender, no masses,  no organomegaly Extremities: extremities normal, atraumatic, no cyanosis or edema Skin: Skin color, texture, turgor normal. No rashes or lesions Lymph nodes: Cervical, supraclavicular, and axillary nodes normal. No abnormal inguinal nodes palpated Neurologic: Grossly normal  Pelvic: External genitalia:  no lesions              Urethra:  normal appearing urethra with no masses, tenderness or lesions              Bartholins and Skenes: normal                 Vagina: normal appearing vagina with normal color and discharge, no lesions              Cervix: no lesions                Bimanual Exam:  Uterus:  normal size, contour, position, consistency, mobility, non-tender              Adnexa: no mass, fullness, tenderness              Rectal exam: {yes no:[id number]}.  Confirms.              Anus:  normal sphincter tone, no lesions  Chaperone was present for exam:  ***  ASSESSMENT     PLAN     An After Visit Summary was printed and given to the patient.  ______ minutes face to face time of which over 50% was spent in counseling.

## 2021-03-27 ENCOUNTER — Ambulatory Visit: Payer: Federal, State, Local not specified - PPO | Admitting: Obstetrics and Gynecology

## 2021-03-27 ENCOUNTER — Other Ambulatory Visit: Payer: Self-pay

## 2021-03-27 ENCOUNTER — Encounter: Payer: Self-pay | Admitting: Obstetrics and Gynecology

## 2021-03-27 VITALS — BP 132/70 | HR 77

## 2021-03-27 DIAGNOSIS — Z719 Counseling, unspecified: Secondary | ICD-10-CM

## 2021-03-27 DIAGNOSIS — N907 Vulvar cyst: Secondary | ICD-10-CM

## 2021-03-27 NOTE — Progress Notes (Signed)
GYNECOLOGY  VISIT   HPI: 76 y.o.   Married  Caucasian  female   G1P1001 with Patient's last menstrual period was 03/23/1994.   here for left vulvar lesion which she states has been present for several years. She feels it has gotten larger. It is not painful.  Looks like it is a pimple.  No known drainage.   She has vulvar HSV.  This does not feel like an outbreak.   Asking about her cervix and if it looks normal.   She also wants to know if it is ok that she still has her uterus and her ovaries, given she had breast cancer.  She tested negative for BRCA 1 and 2.   Long standing constipation.   GYNECOLOGIC HISTORY: Patient's last menstrual period was 03/23/1994. Contraception:  PMP Menopausal hormone therapy:  none Last mammogram:  04-18-20 Unilateral Right breast normal-Left mastectomy/BiRads1 Last pap smear:   10-03-20 Neg, 09-13-18 Neg:Neg HR HPV, 08-19-17 Neg               OB History     Gravida  1   Para  1   Term  1   Preterm      AB      Living  1      SAB      IAB      Ectopic      Multiple      Live Births  1              Patient Active Problem List   Diagnosis Date Noted   Ductal carcinoma in situ (DCIS) of left breast 01/15/2020   Sprain and strain of hip and thigh 05/27/2018   Osteoporosis 03/03/2018   Aortic atherosclerosis ([place]) 01/19/2017   Lymphocytic colitis 12/07/2016   Noninfectious diarrhea    Diarrhea of presumed infectious origin 06/26/2016   Coronary artery disease involving native coronary artery of native heart without angina pectoris 07/22/2015   Personal history of tobacco use, presenting hazards to health 06/26/2015   Rectal polyp    History of smoking 30 or more pack years 05/17/2015   Personal history of nicotine dependence 05/17/2015   Constipation 02/13/2015   Cramp in lower leg 02/12/2015   Difficulty hearing 02/12/2015   Acid reflux 02/12/2015   B12 deficiency 02/12/2015   Abnormal blood sugar 02/12/2015    Hearing loss 02/12/2015   Hypothyroidism 09/10/2014   Insomnia 09/05/2014   Other specified postprocedural states 04/18/2013   S/P breast reconstruction, left 04/18/2013   Personal history of malignant neoplasm of breast 06/20/2012   History of abnormal Pap smear 06/20/2012   Benign hypertension 07/01/2009   Essential (primary) hypertension 07/01/2009   Genital herpes 03/26/2009   Herpesviral infection of urogenital system 03/26/2009   ADD (attention deficit hyperactivity disorder, 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   Hypercholesterolemia without hypertriglyceridemia 02/21/2009   Allergic rhinitis 02/21/2009    Past Medical History:  Diagnosis Date   Abnormal Pap smear of cervix 2014 and 2018   ASCUS HPV not detected--hx of cryotherapy to cervix in her 20's   ADD (attention deficit disorder)    ADD (attention deficit disorder)    Anemia    Arthritis    fingers   Arthritis    Breast cancer ([place])    Left; Age 64   Bursitis    both hips   Colitis    Genital herpes 03/26/2009   GERD (gastroesophageal reflux disease)    History of chest pain  Related to reflux   HSV-2 (herpes simplex virus 2) infection    Hyperlipidemia    Hypertension    Hypothyroidism    Osteopenia    Personal history of chemotherapy    Personal history of tobacco use, presenting hazards to health 06/26/2015   S/P appendectomy 1981   Seborrheic dermatitis    Vertigo    x1 - approx 5 yrs ago    Past Surgical History:  Procedure Laterality Date   APPENDECTOMY     AUGMENTATION MAMMAPLASTY  1982   AUGMENTATION MAMMAPLASTY Bilateral 0623   silicone gel   BREAST IMPLANT EXCHANGE Bilateral 04/21/2016   Procedure: REMOVAL OF BREAST IMPLANTS WITH IMMEDIATE REPLACMENTOF BREAST IMPLANTS;  Surgeon: Youlanda Roys, MD;  Location: [place];  Service: Plastics;  Laterality: Bilateral;   BREAST SURGERY  2005   Breast Implant replaceddue to leaking   BREAST SURGERY Left 2006   due to asymmetry    CARDIAC CATHETERIZATION     [facility] - reflux related   CERVICAL BIOPSY  W/ LOOP ELECTRODE EXCISION     unsure pathology    CESAREAN SECTION  1985   COLONOSCOPY WITH PROPOFOL N/A 06/17/2015   Procedure: COLONOSCOPY WITH PROPOFOL;  Surgeon: Lucilla Lame, MD;  Location: [place];  Service: Endoscopy;  Laterality: N/A;  PT PREFERS EARLY AM   COLONOSCOPY WITH PROPOFOL N/A 07/16/2016   Procedure: COLONOSCOPY WITH PROPOFOL;  Surgeon: Lucilla Lame, MD;  Location: [place];  Service: Endoscopy;  Laterality: N/A;  requests early as possible   COSMETIC SURGERY     eyelid surgery   DILATION AND CURETTAGE OF UTERUS     ENDOMETRIAL BIOPSY     eye lid surgery     MASTECTOMY Left    new implant on the Right   MASTOPEXY Right 04/21/2016   Procedure: MASTOPEXY;  Surgeon: Youlanda Roys, MD;  Location: [place];  Service: Plastics;  Laterality: Right;   POLYPECTOMY  06/17/2015   Procedure: POLYPECTOMY;  Surgeon: Lucilla Lame, MD;  Location: [place];  Service: Endoscopy;;   TONSILLECTOMY  child   TUBAL LIGATION      Current Outpatient Medications  Medication Sig Dispense Refill   amphetamine-dextroamphetamine (ADDERALL) 20 MG tablet Take by mouth.     Ascorbic Acid (VITAMIN C PO) Take 1,000 mg by mouth.     azelastine (ASTELIN) 0.1 % nasal spray Place into both nostrils 2 (two) times daily as needed for rhinitis. Use in each nostril as directed     cholecalciferol (VITAMIN D) 1000 UNITS tablet Take 1,000 Units by mouth daily.     cyanocobalamin (,VITAMIN B-12,) 1000 MCG/ML injection USE 1 ML PER INJECTION, IM WEEKLY (Patient taking differently: every 30 (thirty) days.) 10 mL 3   levothyroxine (SYNTHROID) 88 MCG tablet PLEASE SEE ATTACHED FOR DETAILED DIRECTIONS     losartan (COZAAR) 100 MG tablet Take 100 mg by mouth daily.     Melatonin 5 MG TABS Take by mouth at bedtime.      mometasone (ELOCON) 0.1 % lotion Apply topically as  needed.     Multiple Vitamin (MULTIVITAMIN) tablet Take 1 tablet by mouth daily.     Omega-3 Fatty Acids (FISH OIL PO) Take by mouth daily at 2 PM. Barlean's Fishoil 1 tsp     OVER THE COUNTER MEDICATION New Chapter Bone Strength Takes: 3 tablets qhs     S-Adenosylmethionine (SAM-E PO) Take by mouth daily.     UNABLE  TO FIND Place 20 mg vaginally 2 (two) times a week. Isaiah Blakes Key-E Vaginal Suppositories     valACYclovir (VALTREX) 500 MG tablet Take one tablet twice daily at onset of outbreak x 3 days 30 tablet 2   No current facility-administered medications for this visit.     ALLERGIES: Statins and Sulfamethoxazole  Family History  Problem Relation Age of Onset   Breast cancer Mother 39   Heart disease Father    Skin cancer Brother        Melanoma   Crohn's disease Brother     Social History   Socioeconomic History   Marital status: Married    Spouse name: Not on file   Number of children: Not on file   Years of education: Not on file   Highest education level: Not on file  Occupational History   Not on file  Tobacco Use   Smoking status: Former    Packs/day: 1.00    Years: 35.00    Pack years: 35.00    Types: Cigarettes    Quit date: 03/22/2004    Years since quitting: 17.0   Smokeless tobacco: Never  Vaping Use   Vaping Use: Never used  Substance and Sexual Activity   Alcohol use: Yes    Alcohol/week: 7.0 standard drinks    Types: 7 Glasses of wine per week   Drug use: No   Sexual activity: Yes    Partners: Male    Birth control/protection: Surgical    Comment: btl  Other Topics Concern   Not on file  Social History Narrative   Not on file   Social Determinants of Health   Financial Resource Strain: Not on file  Food Insecurity: Not on file  Transportation Needs: Not on file  Physical Activity: Not on file  Stress: Not on file  Social Connections: Not on file  Intimate Partner Violence: Not on file    Review of Systems  Genitourinary:         Vulvar lesion  All other systems reviewed and are negative.  PHYSICAL EXAMINATION:    BP 132/70    Pulse 77    LMP 03/23/1994    SpO2 92%     General appearance: alert, cooperative and appears stated age   Pelvic: External genitalia:  left medial labia minora with 7 x 3 mm sebaceous cyst.  Nontender.               Urethra:  urethral mucosa visible.              Bartholins and Skenes: normal                 Vagina: normal appearing vagina with normal color and discharge, no lesions              Cervix: no lesions                Bimanual Exam:  Uterus:  normal size, contour, position, consistency, mobility, non-tender              Adnexa: no mass, fullness, tenderness      Chaperone was present for exam:  Estill Bamberg, CMA  ASSESSMENT  Vulvar sebaceous cyst.  No sign of infection.  Encounter for health education.  Hx breast cancer.  Negative BRCA status.   PLAN  We discussed epidermoid cysts.   She will let me know if the area becomes painful or if she would like removal.  I explained the procedure for  removing the cyst in the office setting.  Education about her cervical anatomy.  Signs and symptoms of uterine and ovarian cancer discussed with the patient.  We discussed the benefit of her having maintained her ovaries to protect against cardiovascular disease, osteoporosis, and other medical illness.  FU prn.    An After Visit Summary was printed and given to the patient.  24 min  total time was spent for this patient encounter, including preparation, face-to-face counseling with the patient, coordination of care, and documentation of the encounter.

## 2021-03-27 NOTE — Patient Instructions (Signed)
Epidermoid Cyst An epidermoid cyst, also known as epidermal cyst, is a sac made of skin tissue. The sac contains a substance called keratin. Keratin is a protein that is normally secreted through the hair follicles. When keratin becomes trapped in the top layer of skin (epidermis), it can form an epidermoid cyst. Epidermoid cysts can be found anywhere on your body. These cysts are usually harmless (benign), and they may not cause symptoms unless they become inflamed or infected. What are the causes? This condition may be caused by: A blocked hair follicle. A hair that curls and re-enters the skin instead of growing straight out of the skin (ingrown hair). A blocked pore. Irritated skin. An injury to the skin. Certain conditions that are passed along from parent to child (inherited). Human papillomavirus (HPV). This happens rarely when cysts occur on the bottom of the feet. Long-term (chronic) sun damage to the skin. What increases the risk? The following factors may make you more likely to develop an epidermoid cyst: Having acne. Being female. Having an injury to the skin. Being past puberty. Having certain rare genetic disorders. What are the signs or symptoms? The only symptom of this condition may be a small, painless lump underneath the skin. When an epidermal cyst ruptures, it may become inflamed. True infection in cysts is rare. Symptoms may include: Redness. Inflammation. Tenderness. Warmth. Keratin draining from the cyst. Keratin is grayish-white, bad-smelling substance. Pus draining from the cyst. How is this diagnosed? This condition is diagnosed with a physical exam. In some cases, you may have a sample of tissue (biopsy) taken from your cyst to be examined under a microscope or tested for bacteria. You may be referred to a health care provider who specializes in skin care (dermatologist). How is this treated? If a cyst becomes inflamed, treatment may include: Opening and  draining the cyst, done by a health care provider. After draining, minor surgery to remove the rest of the cyst may be done. Taking antibiotic medicine. Having injections of medicines (steroids) that help to reduce inflammation. Having surgery to remove the cyst. Surgery may be done if the cyst: Becomes large. Bothers you. Has a chance of turning into cancer. Do not try to open a cyst yourself. Follow these instructions at home: Medicines If you were prescribed an antibiotic medicine, take it it as told by your health care provider. Do not stop using the antibiotic even if you start to feel better. Take over-the-counter and prescription medicines only as told by your health care provider. General instructions Keep the area around your cyst clean and dry. Wear loose, dry clothing. Avoid touching your cyst. Check your cyst every day for signs of infection. Check for: Redness, swelling, or pain. Fluid or blood. Warmth. Pus or a bad smell. Keep all follow-up visits. This is important. How is this prevented? Wear clean, dry, clothing. Avoid wearing tight clothing. Keep your skin clean and dry. Take showers or baths every day. Contact a health care provider if: Your cyst develops symptoms of infection. Your condition is not improving or is getting worse. You develop a cyst that looks different from other cysts you have had. You have a fever. Get help right away if: Redness spreads from the cyst into the surrounding area. Summary An epidermoid cyst is a sac made of skin tissue. These cysts are usually harmless (benign), and they may not cause symptoms unless they become inflamed. If a cyst becomes inflamed, treatment may include surgery to open and drain the cyst,  or to remove it. Treatment may also include medicines by mouth or through an injection. Take over-the-counter and prescription medicines only as told by your health care provider. If you were prescribed an antibiotic medicine,  take it as told by your health care provider. Do not stop using the antibiotic even if you start to feel better. Contact a health care provider if your condition is not improving or is getting worse. Keep all follow-up visits as told by your health care provider. This is important. This information is not intended to replace advice given to you by your health care provider. Make sure you discuss any questions you have with your health care provider. Document Revised: 06/14/2019 Document Reviewed: 06/14/2019 Elsevier Patient Education  [place].

## 2021-04-22 ENCOUNTER — Ambulatory Visit
Admission: RE | Admit: 2021-04-22 | Discharge: 2021-04-22 | Disposition: A | Discharge status: Home / Self Care | Payer: Federal, State, Local not specified - PPO | Source: Ambulatory Visit | Attending: Obstetrics and Gynecology | Admitting: Obstetrics and Gynecology

## 2021-04-22 DIAGNOSIS — Z1231 Encounter for screening mammogram for malignant neoplasm of breast: Secondary | ICD-10-CM

## 2021-09-16 ENCOUNTER — Encounter: Payer: Self-pay | Admitting: Obstetrics and Gynecology

## 2021-10-17 NOTE — Progress Notes (Signed)
Office Visit Note  Patient: Adriana Hicks             Date of Birth: 04/11/1945           MRN: [id number]             PCP: Adin Hector, MD Referring: Adin Hector, MD Visit Date: 10/30/2021 Occupation: '@GUAROCC'$ @  Subjective:  Discuss treatment options for osteoporosis  History of Present Illness: Adriana Hicks is a 76 y.o. female with history of osteoporosis.  She came today to discuss different treatment options.  Patient states that she was diagnosed with osteopenia several years ago.  She recalls taking Fosamax but could not tolerate it due to GI side effects.  Last year she was diagnosed with osteoporosis.  She has discussed treatment options with her PCP and other doctors.  She wants to discuss treatment options with me today.  She denies any previous history of fracture.  She smoked 1 pack/day for 30 years and quit smoking in 2005.  She has been taking Synthroid for hypothyroidism.  She denies intake of any antiepileptic medications.  She gives history of gastroesophageal reflux.  She has been active all her life.  She works as Engineer, technical sales for [employer].  She goes for water aerobics 3 times a week and walks 3 miles twice a week.  She is also very active in her garden.  She denies any family history of osteoporosis.  Activities of Daily Living:  Patient reports morning stiffness for 0  none .   Patient Denies nocturnal pain.  Difficulty dressing/grooming: Denies Difficulty climbing stairs: Denies Difficulty getting out of chair: Denies Difficulty using hands for taps, buttons, cutlery, and/or writing: Denies  Review of Systems  Constitutional:  Positive for fatigue.  HENT:  Negative for mouth sores and mouth dryness.   Eyes:  Positive for dryness.  Respiratory:  Negative for shortness of breath.   Cardiovascular:  Positive for chest pain. Negative for palpitations.  Gastrointestinal:  Negative for blood in stool, constipation and diarrhea.   Endocrine: Negative for increased urination.  Genitourinary:  Positive for involuntary urination.  Musculoskeletal:  Negative for joint pain, gait problem, joint pain, joint swelling, myalgias, muscle weakness, morning stiffness, muscle tenderness and myalgias.  Skin:  Positive for hair loss and sensitivity to sunlight. Negative for color change and rash.  Allergic/Immunologic: Negative for susceptible to infections.  Neurological:  Negative for dizziness and headaches.  Hematological:  Negative for swollen glands.  Psychiatric/Behavioral:  Negative for depressed mood and sleep disturbance. The patient is nervous/anxious.     PMFS History:  Patient Active Problem List   Diagnosis Date Noted   Ductal carcinoma in situ (DCIS) of left breast 01/15/2020   Sprain and strain of hip and thigh 05/27/2018   Osteoporosis 03/03/2018   Aortic atherosclerosis ([place]) 01/19/2017   Lymphocytic colitis 12/07/2016   Noninfectious diarrhea    Diarrhea of presumed infectious origin 06/26/2016   Coronary artery disease involving native coronary artery of native heart without angina pectoris 07/22/2015   Personal history of tobacco use, presenting hazards to health 06/26/2015   Rectal polyp    History of smoking 30 or more pack years 05/17/2015   Personal history of nicotine dependence 05/17/2015   Constipation 02/13/2015   Cramp in lower leg 02/12/2015   Difficulty hearing 02/12/2015   Acid reflux 02/12/2015   B12 deficiency 02/12/2015   Abnormal blood sugar 02/12/2015   Hearing loss 02/12/2015  Hypothyroidism 09/10/2014   Insomnia 09/05/2014   Other specified postprocedural states 04/18/2013   S/P breast reconstruction, left 04/18/2013   Personal history of malignant neoplasm of breast 06/20/2012   History of abnormal Pap smear 06/20/2012   Benign hypertension 07/01/2009   Essential (primary) hypertension 07/01/2009   Genital herpes 03/26/2009   Herpesviral infection of urogenital system  03/26/2009   Attention deficit hyperactivity disorder (ADHD), predominantly inattentive type 02/21/2009   Hypercholesterolemia without hypertriglyceridemia 02/21/2009   Allergic rhinitis 02/21/2009    Past Medical History:  Diagnosis Date   Abnormal Pap smear of cervix 2014 and 2018   ASCUS HPV not detected--hx of cryotherapy to cervix in her 20's   ADD (attention deficit disorder)    ADD (attention deficit disorder)    Anemia    Arthritis    fingers   Arthritis    Breast cancer ([place])    Left; Age 76   Bursitis    both hips   Colitis    Genital herpes 03/26/2009   GERD (gastroesophageal reflux disease)    History of chest pain    Related to reflux   HSV-2 (herpes simplex virus 2) infection    Hyperlipidemia    Hypertension    Hypothyroidism    Osteopenia    Osteoporosis    Personal history of chemotherapy    Personal history of tobacco use, presenting hazards to health 06/26/2015   S/P appendectomy 1981   Seborrheic dermatitis    Vertigo    x1 - approx 5 yrs ago    Family History  Problem Relation Age of Onset   Breast cancer Mother 29   Heart disease Father    Skin cancer Brother        Melanoma   Crohn's disease Brother    Past Surgical History:  Procedure Laterality Date   APPENDECTOMY     AUGMENTATION MAMMAPLASTY  1982   AUGMENTATION MAMMAPLASTY Bilateral 2025   silicone gel   BREAST IMPLANT EXCHANGE Bilateral 04/21/2016   Procedure: REMOVAL OF BREAST IMPLANTS WITH IMMEDIATE REPLACMENTOF BREAST IMPLANTS;  Surgeon: Youlanda Roys, MD;  Location: [facility];  Service: Plastics;  Laterality: Bilateral;   BREAST SURGERY  2005   Breast Implant replaceddue to leaking   BREAST SURGERY Left 2006   due to asymmetry   CARDIAC CATHETERIZATION     2011 at [facility] - reflux related   CERVICAL BIOPSY  W/ LOOP ELECTRODE EXCISION     unsure pathology    CESAREAN SECTION  1985   COLONOSCOPY WITH PROPOFOL N/A 06/17/2015   Procedure:  COLONOSCOPY WITH PROPOFOL;  Surgeon: Lucilla Lame, MD;  Location: [facility];  Service: Endoscopy;  Laterality: N/A;  PT PREFERS EARLY AM   COLONOSCOPY WITH PROPOFOL N/A 07/16/2016   Procedure: COLONOSCOPY WITH PROPOFOL;  Surgeon: Lucilla Lame, MD;  Location: [facility];  Service: Endoscopy;  Laterality: N/A;  requests early as possible   COSMETIC SURGERY     eyelid surgery   DILATION AND CURETTAGE OF UTERUS     ENDOMETRIAL BIOPSY     eye lid surgery     MASTECTOMY Left    new implant on the Right   MASTOPEXY Right 04/21/2016   Procedure: MASTOPEXY;  Surgeon: Youlanda Roys, MD;  Location: [facility];  Service: Plastics;  Laterality: Right;   POLYPECTOMY  06/17/2015   Procedure: POLYPECTOMY;  Surgeon: Lucilla Lame, MD;  Location: [facility];  Service: Endoscopy;;   TONSILLECTOMY  child  TUBAL LIGATION     Social History   Social History Narrative   Not on file   Immunization History  Administered Date(s) Administered   Fluad Quad(high Dose 65+) 12/26/2018, 01/06/2020   Influenza Split 03/26/2009, 01/28/2010, 01/24/2011, 01/09/2012   Influenza, High Dose Seasonal PF 01/06/2014, 01/03/2015, 01/04/2017, 12/19/2017   Influenza,inj,Quad PF,6+ Mos 01/26/2013   Influenza-Unspecified 01/29/2016, 12/26/2018, 01/06/2020   Moderna Covid-19 Vaccine Bivalent Booster 80yr & up 12/30/2020   Moderna Sars-Covid-2 Vaccination 04/19/2019, 05/17/2019, 02/01/2020, 08/16/2020   Pneumococcal Conjugate-13 01/03/2015   Pneumococcal Polysaccharide-23 12/23/2012, 03/23/2013   Pneumococcal-Unspecified 03/23/2013   Td 03/23/2009   Tdap 11/27/2010   Zoster Recombinat (Shingrix) 10/19/2017, 02/06/2018   Zoster, Live 05/20/2012, 04/23/2013     Objective: Vital Signs: BP (!) 163/69 (BP Location: Right Arm, Patient Position: Sitting, Cuff Size: Normal)   Pulse 67   Resp 16   Ht '5\' 2"'$  (1.575 m)   Wt 131 lb (59.4 kg)   LMP 03/23/1994   BMI 23.96 kg/m     Physical Exam Vitals and nursing note reviewed.  Constitutional:      Appearance: She is well-developed.  HENT:     Head: Normocephalic and atraumatic.  Eyes:     Conjunctiva/sclera: Conjunctivae normal.  Cardiovascular:     Rate and Rhythm: Normal rate and regular rhythm.     Heart sounds: Normal heart sounds.  Pulmonary:     Effort: Pulmonary effort is normal.     Breath sounds: Normal breath sounds.  Abdominal:     General: Bowel sounds are normal.     Palpations: Abdomen is soft.  Musculoskeletal:     Cervical back: Normal range of motion.  Lymphadenopathy:     Cervical: No cervical adenopathy.  Skin:    General: Skin is warm and dry.     Capillary Refill: Capillary refill takes less than 2 seconds.  Neurological:     Mental Status: She is alert and oriented to person, place, and time.  Psychiatric:        Behavior: Behavior normal.      Musculoskeletal Exam: C-spine was in good range of motion.  Thoracic kyphosis was noted.  She had no point tenderness.  She had good mobility in her spine.  Shoulder joints, elbow joints, wrist joints, MCPs PIPs and DIPs with good range of motion.  She had bilateral PIP and DIP thickening with no synovitis.  Hip joints and knee joints with good range of motion.  She has thickening of bilateral PIP and DIP of her feet.  Dorsal spurs were noted.  CDAI Exam: CDAI Score: -- Patient Global: --; Provider Global: -- Swollen: --; Tender: -- Joint Exam 10/30/2021   No joint exam has been documented for this visit   There is currently no information documented on the homunculus. Go to the Rheumatology activity and complete the homunculus joint exam.  Investigation: No additional findings.  Imaging: No results found.  Recent Labs: Lab Results  Component Value Date   WBC 13.2 (H) 10/26/2020   HGB 12.0 10/26/2020   PLT 285 10/26/2020   NA 136 10/26/2020   K 3.9 10/26/2020   CL 103 10/26/2020   CO2 26 10/26/2020   GLUCOSE 109 (H)  10/26/2020   BUN 16 10/26/2020   CREATININE 0.69 10/26/2020   BILITOT 0.3 01/15/2020   ALKPHOS 69 01/15/2020   AST 21 01/15/2020   ALT 23 01/15/2020   PROT 7.2 01/15/2020   ALBUMIN 4.4 01/15/2020   CALCIUM 9.8 10/26/2020  GFRAA >60 04/16/2016   March 07, 2020 DEXA scan lumbar spine BMD 0.766, T-score -2.6% change 0.6% Speciality Comments: No specialty comments available.  Procedures:  No procedures performed Allergies: Statins and Sulfamethoxazole   Assessment / Plan:     Visit Diagnoses: Age-related osteoporosis without current pathological fracture -patient states that she was given the diagnosis of osteopenia several years ago.  She recalls being on Fosamax but could not tolerate due to GI side effects.  She was diagnosed with osteoporosis last year.  Different treatment options were discussed by different physicians.  She has done a lot of reading and is concerned about the side effects of most medications.  Detailed counsel regarding osteoporosis was provided.  Patient denies any personal or family history of fracture.  She gives history of smoking for 30 years but she quit several years ago.  She also has history of hypothyroidism.  She has been very active and has been exercising on a regular basis.  Different treatment options and their side effects were discussed at length.  Indications side effects contraindications of IV Reclast and subcutaneous Prolia were discussed.  Patient had intolerance to oral bisphosphonates.  Side effects of IV Reclast including osteonecrosis of the jaw and atypical fracture were also discussed.  After reviewing side effects of both medications she wants to proceed with IV Reclast.  Patient lives in [place], [place] and will get treatment there.  She came only to get the opinion regarding the medication choice.  All the questions were addressed. March 07, 2020 DEXA scan lumbar spine BMD 0.766, T-score -2.6% change 0.6%  Primary osteoarthritis  of both hands-she had bilateral PIP and DIP thickening.  She has some stiffness but not much discomfort.  Joint protection muscle strengthening was discussed.  Primary osteoarthritis of both feet-she also has osteoarthritis in her bilateral feet.  Proper fitting shoes with arch support were discussed.  Postural kyphosis of thoracic region-she had postural kyphosis.  Stretching exercises were demonstrated in the office.  She had no point tenderness.  Ductal carcinoma in situ (DCIS) of left breast -diagnosed in 1996, status post left mastectomy and chemotherapy followed by tamoxifen for 5 years.  No radiation therapy per patient.  S/P breast reconstruction, left  Essential (primary) hypertension-blood pressure was elevated today.  I advised her to monitor blood pressure closely and follow-up with her PCP.  Other medical problems are listed as follows:  Coronary artery disease involving native coronary artery of native heart without angina pectoris  Aortic atherosclerosis ([facility])  History of gastroesophageal reflux (GERD)  Lymphocytic colitis  History of hypothyroidism-she is on Synthroid.  Herpesviral infection of urogenital system  B12 deficiency  Attention deficit hyperactivity disorder (ADHD), predominantly inattentive type  History of smoking 30 or more pack years  Orders: No orders of the defined types were placed in this encounter.  No orders of the defined types were placed in this encounter.    Follow-Up Instructions: Return if symptoms worsen or fail to improve, for Osteoporosis.   Bo Merino, MD  Note - This record has been created using Editor, commissioning.  Chart creation errors have been sought, but may not always  have been located. Such creation errors do not reflect on  the standard of medical care.

## 2021-10-30 ENCOUNTER — Encounter: Payer: Self-pay | Admitting: Rheumatology

## 2021-10-30 ENCOUNTER — Ambulatory Visit: Payer: Federal, State, Local not specified - PPO | Attending: Rheumatology | Admitting: Rheumatology

## 2021-10-30 VITALS — BP 163/69 | HR 67 | Resp 16 | Ht 62.0 in | Wt 131.0 lb

## 2021-10-30 DIAGNOSIS — M19041 Primary osteoarthritis, right hand: Secondary | ICD-10-CM | POA: Diagnosis not present

## 2021-10-30 DIAGNOSIS — D0512 Intraductal carcinoma in situ of left breast: Secondary | ICD-10-CM

## 2021-10-30 DIAGNOSIS — M810 Age-related osteoporosis without current pathological fracture: Secondary | ICD-10-CM | POA: Diagnosis not present

## 2021-10-30 DIAGNOSIS — E538 Deficiency of other specified B group vitamins: Secondary | ICD-10-CM

## 2021-10-30 DIAGNOSIS — M19072 Primary osteoarthritis, left ankle and foot: Secondary | ICD-10-CM

## 2021-10-30 DIAGNOSIS — A6000 Herpesviral infection of urogenital system, unspecified: Secondary | ICD-10-CM

## 2021-10-30 DIAGNOSIS — K52832 Lymphocytic colitis: Secondary | ICD-10-CM

## 2021-10-30 DIAGNOSIS — I700 Atherosclerosis of aorta: Secondary | ICD-10-CM

## 2021-10-30 DIAGNOSIS — F900 Attention-deficit hyperactivity disorder, predominantly inattentive type: Secondary | ICD-10-CM

## 2021-10-30 DIAGNOSIS — I2510 Atherosclerotic heart disease of native coronary artery without angina pectoris: Secondary | ICD-10-CM

## 2021-10-30 DIAGNOSIS — Z8719 Personal history of other diseases of the digestive system: Secondary | ICD-10-CM

## 2021-10-30 DIAGNOSIS — M4004 Postural kyphosis, thoracic region: Secondary | ICD-10-CM | POA: Diagnosis not present

## 2021-10-30 DIAGNOSIS — Z87891 Personal history of nicotine dependence: Secondary | ICD-10-CM

## 2021-10-30 DIAGNOSIS — Z9889 Other specified postprocedural states: Secondary | ICD-10-CM

## 2021-10-30 DIAGNOSIS — M19042 Primary osteoarthritis, left hand: Secondary | ICD-10-CM

## 2021-10-30 DIAGNOSIS — K621 Rectal polyp: Secondary | ICD-10-CM

## 2021-10-30 DIAGNOSIS — I10 Essential (primary) hypertension: Secondary | ICD-10-CM

## 2021-10-30 DIAGNOSIS — Z8639 Personal history of other endocrine, nutritional and metabolic disease: Secondary | ICD-10-CM

## 2021-10-30 DIAGNOSIS — M19071 Primary osteoarthritis, right ankle and foot: Secondary | ICD-10-CM | POA: Diagnosis not present

## 2021-10-30 NOTE — Patient Instructions (Addendum)
Osteoporosis  Osteoporosis is when the bones get thin and weak. This can cause your bones to break (fracture) more easily. What are the causes? The exact cause of this condition is not known. What increases the risk? Having family members with this condition. Not eating enough healthy foods. Taking certain medicines. Being female. Being age 74 or older. Smoking or using other products that contain nicotine or tobacco, such as e-cigarettes or chewing tobacco. Not exercising. Being of European or Asian ancestry. Having a small body frame. What are the signs or symptoms? A broken bone might be the first sign, especially if the break results from a fall or injury that usually would not cause a bone to break. Other signs and symptoms include: Pain in the neck or low back. Being hunched over (stooped posture). Getting shorter. How is this treated? Eating more foods with more calcium and vitamin D in them. Doing exercises. Stopping tobacco use. Limiting how much alcohol you drink. Taking medicines to slow bone loss or help make the bones stronger. Taking supplements of calcium and vitamin D every day. Taking medicines to replace chemicals in the body (hormone replacement medicines). Monitoring your levels of calcium and vitamin D. The goal of treatment is to strengthen your bones and lower your risk for a bone break. Follow these instructions at home: Eating and drinking Eat plenty of calcium and vitamin D. These nutrients are good for your bones. Good sources of calcium and vitamin D include: Some fish, such as salmon and tuna. Foods that have calcium and vitamin D added to them (fortified foods), such as some breakfast cereals. Egg yolks. Cheese. Liver.  Activity Do exercises as told by your doctor. Ask your doctor what exercises are safe for you. You should do: Exercises that make your muscles work to hold your body weight up (weight-bearing exercises). These include tai chi,  yoga, and walking. Exercises to make your muscles stronger. One example is lifting weights. Lifestyle Do not drink alcohol if: Your doctor tells you not to drink. You are pregnant, may be pregnant, or are planning to become pregnant. If you drink alcohol: Limit how much you use to: 0-1 drink a day for women. 0-2 drinks a day for men. Know how much alcohol is in your drink. In the [place] one drink equals one 12 oz bottle of beer (355 mL), one 5 oz glass of wine (148 mL), or one 1 oz glass of hard liquor (44 mL). Do not smoke or use any products that contain nicotine or tobacco. If you need help quitting, ask your doctor. Preventing falls Use tools to help you move around (mobility aids) as needed. These include canes, walkers, scooters, and crutches. Keep rooms well-lit. Put away things on the floor that could make you trip. These include cords and rugs. Install safety rails on stairs. Install grab bars in bathrooms. Use rubber mats in slippery areas, like bathrooms. Wear shoes that: Fit you well. Support your feet. Have closed toes. Have rubber soles or low heels. Tell your doctor about all of the medicines you are taking. Some medicines can make you more likely to fall. General instructions Take over-the-counter and prescription medicines only as told by your doctor. Keep all follow-up visits. Contact a doctor if: You have not been tested (screened) for osteoporosis and you are: A woman who is age 74 or older. A man who is age 35 or older. Get help right away if: You fall. You get hurt. Summary Osteoporosis happens when your  bones get thin and weak. Weak bones can break (fracture) more easily. Eat plenty of calcium and vitamin D. These are good for your bones. Tell your doctor about all of the medicines that you take. This information is not intended to replace advice given to you by your health care provider. Make sure you discuss any questions you have with your health care  provider. Document Revised: 08/24/2019 Document Reviewed: 08/24/2019 Elsevier Patient Education  [place].   Zoledronic Acid Injection (Bone Disorders) What is this medication? ZOLEDRONIC ACID (ZOE le dron ik AS id) prevents and treats osteoporosis. It may also be used to treat Paget's disease of the bone. It works by Paramedic stronger and less likely to break (fracture). It belongs to a group of medications called bisphosphonates. This medicine may be used for other purposes; ask your health care provider or pharmacist if you have questions. COMMON BRAND NAME(S): Reclast What should I tell my care team before I take this medication? They need to know if you have any of these conditions: Bleeding disorder Cancer Dental disease Kidney disease Low levels of calcium in the blood Low red blood cell counts Lung or breathing disease, such as asthma Receiving steroids, such as dexamethasone or prednisone An unusual or allergic reaction to zoledronic acid, other medications, foods, dyes, or preservatives Pregnant or trying to get pregnant Breast-feeding How should I use this medication? This medication is injected into a vein. It is given by your care team in a hospital or clinic setting. A special MedGuide will be given to you before each treatment. Be sure to read this information carefully each time. Talk to your care team about the use of this medication in children. Special care may be needed. Overdosage: If you think you have taken too much of this medicine contact a poison control center or emergency room at once. NOTE: This medicine is only for you. Do not share this medicine with others. What if I miss a dose? Keep appointments for follow-up doses. It is important not to miss your dose. Call your care team if you are unable to keep an appointment. What may interact with this medication? Certain antibiotics given by injection Medications for pain and inflammation,  such as ibuprofen, naproxen, NSAIDs Some diuretics, such as bumetanide, furosemide Teriparatide This list may not describe all possible interactions. Give your health care provider a list of all the medicines, herbs, non-prescription drugs, or dietary supplements you use. Also tell them if you smoke, drink alcohol, or use illegal drugs. Some items may interact with your medicine. What should I watch for while using this medication? Visit your care team for regular checks on your progress. It may be some time before you see the benefit from this medication. Some people who take this medication have severe bone, joint, or muscle pain. This medication may also increase your risk for jaw problems or a broken thigh bone. Tell your care team right away if you have severe pain in your jaw, bones, joints, or muscles. Tell your care team if you have any pain that does not go away or that gets worse. You should make sure you get enough calcium and vitamin D while you are taking this medication. Discuss the foods you eat and the vitamins you take with your care team. You may need bloodwork while taking this medication. Tell your dentist and dental surgeon that you are taking this medication. You should not have major dental surgery while on this medication.  See your dentist to have a dental exam and fix any dental problems before starting this medication. Take good care of your teeth while on this medication. Make sure you see your dentist for regular follow-up appointments. What side effects may I notice from receiving this medication? Side effects that you should report to your care team as soon as possible: Allergic reactions--skin rash, itching, hives, swelling of the face, lips, tongue, or throat Kidney injury--decrease in the amount of urine, swelling of the ankles, hands, or feet Low calcium level--muscle pain or cramps, confusion, tingling, or numbness in the hands or feet Osteonecrosis of the jaw--pain,  swelling, or redness in the mouth, numbness of the jaw, poor healing after dental work, unusual discharge from the mouth, visible bones in the mouth Severe bone, joint, or muscle pain Side effects that usually do not require medical attention (report to your care team if they continue or are bothersome): Diarrhea Dizziness Headache Nausea Stomach pain Vomiting This list may not describe all possible side effects. Call your doctor for medical advice about side effects. You may report side effects to FDA at 1-800-FDA-1088. Where should I keep my medication? This medication is given in a hospital or clinic. It will not be stored at home. NOTE: This sheet is a summary. It may not cover all possible information. If you have questions about this medicine, talk to your doctor, pharmacist, or health care provider.  2023 Elsevier/Gold Standard (2007-04-30 00:00:00) Back Exercises The following exercises strengthen the muscles that help to support the trunk (torso) and back. They also help to keep the lower back flexible. Doing these exercises can help to prevent or lessen existing low back pain. If you have back pain or discomfort, try doing these exercises 2-3 times each day or as told by your health care provider. As your pain improves, do them once each day, but increase the number of times that you repeat the steps for each exercise (do more repetitions). To prevent the recurrence of back pain, continue to do these exercises once each day or as told by your health care provider. Do exercises exactly as told by your health care provider and adjust them as directed. It is normal to feel mild stretching, pulling, tightness, or discomfort as you do these exercises, but you should stop right away if you feel sudden pain or your pain gets worse. Exercises Single knee to chest Repeat these steps 3-5 times for each leg: Lie on your back on a firm bed or the floor with your legs extended. Bring one knee  to your chest. Your other leg should stay extended and in contact with the floor. Hold your knee in place by grabbing your knee or thigh with both hands and hold. Pull on your knee until you feel a gentle stretch in your lower back or buttocks. Hold the stretch for 10-30 seconds. Slowly release and straighten your leg.  Pelvic tilt Repeat these steps 5-10 times: Lie on your back on a firm bed or the floor with your legs extended. Bend your knees so they are pointing toward the ceiling and your feet are flat on the floor. Tighten your lower abdominal muscles to press your lower back against the floor. This motion will tilt your pelvis so your tailbone points up toward the ceiling instead of pointing to your feet or the floor. With gentle tension and even breathing, hold this position for 5-10 seconds.  Cat-cow Repeat these steps until your lower back becomes more flexible:  Get into a hands-and-knees position on a firm bed or the floor. Keep your hands under your shoulders, and keep your knees under your hips. You may place padding under your knees for comfort. Let your head hang down toward your chest. Contract your abdominal muscles and point your tailbone toward the floor so your lower back becomes rounded like the back of a cat. Hold this position for 5 seconds. Slowly lift your head, let your abdominal muscles relax, and point your tailbone up toward the ceiling so your back forms a sagging arch like the back of a cow. Hold this position for 5 seconds.  Press-ups Repeat these steps 5-10 times: Lie on your abdomen (face-down) on a firm bed or the floor. Place your palms near your head, about shoulder-width apart. Keeping your back as relaxed as possible and keeping your hips on the floor, slowly straighten your arms to raise the top half of your body and lift your shoulders. Do not use your back muscles to raise your upper torso. You may adjust the placement of your hands to make yourself  more comfortable. Hold this position for 5 seconds while you keep your back relaxed. Slowly return to lying flat on the floor.  Bridges Repeat these steps 10 times: Lie on your back on a firm bed or the floor. Bend your knees so they are pointing toward the ceiling and your feet are flat on the floor. Your arms should be flat at your sides, next to your body. Tighten your buttocks muscles and lift your buttocks off the floor until your waist is at almost the same height as your knees. You should feel the muscles working in your buttocks and the back of your thighs. If you do not feel these muscles, slide your feet 1-2 inches (2.5-5 cm) farther away from your buttocks. Hold this position for 3-5 seconds. Slowly lower your hips to the starting position, and allow your buttocks muscles to relax completely. If this exercise is too easy, try doing it with your arms crossed over your chest. Abdominal crunches Repeat these steps 5-10 times: Lie on your back on a firm bed or the floor with your legs extended. Bend your knees so they are pointing toward the ceiling and your feet are flat on the floor. Cross your arms over your chest. Tip your chin slightly toward your chest without bending your neck. Tighten your abdominal muscles and slowly raise your torso high enough to lift your shoulder blades a tiny bit off the floor. Avoid raising your torso higher than that because it can put too much stress on your lower back and does not help to strengthen your abdominal muscles. Slowly return to your starting position.  Back lifts Repeat these steps 5-10 times: Lie on your abdomen (face-down) with your arms at your sides, and rest your forehead on the floor. Tighten the muscles in your legs and your buttocks. Slowly lift your chest off the floor while you keep your hips pressed to the floor. Keep the back of your head in line with the curve in your back. Your eyes should be looking at the floor. Hold this  position for 3-5 seconds. Slowly return to your starting position.  Contact a health care provider if: Your back pain or discomfort gets much worse when you do an exercise. Your worsening back pain or discomfort does not lessen within 2 hours after you exercise. If you have any of these problems, stop doing these exercises right away. Do  not do them again unless your health care provider says that you can. Get help right away if: You develop sudden, severe back pain. If this happens, stop doing the exercises right away. Do not do them again unless your health care provider says that you can. This information is not intended to replace advice given to you by your health care provider. Make sure you discuss any questions you have with your health care provider. Document Revised: 09/03/2020 Document Reviewed: 05/22/2020 Elsevier Patient Education  [place].

## 2022-04-02 ENCOUNTER — Other Ambulatory Visit: Payer: Self-pay | Admitting: Internal Medicine

## 2022-04-02 DIAGNOSIS — Z1231 Encounter for screening mammogram for malignant neoplasm of breast: Secondary | ICD-10-CM

## 2022-05-25 ENCOUNTER — Ambulatory Visit
Admission: RE | Admit: 2022-05-25 | Discharge: 2022-05-25 | Disposition: A | Discharge status: Home / Self Care | Payer: Federal, State, Local not specified - PPO | Source: Ambulatory Visit | Attending: Internal Medicine | Admitting: Internal Medicine

## 2022-05-25 DIAGNOSIS — Z1231 Encounter for screening mammogram for malignant neoplasm of breast: Secondary | ICD-10-CM

## 2022-11-04 ENCOUNTER — Telehealth: Payer: Self-pay

## 2022-11-04 NOTE — Telephone Encounter (Signed)
[facility] is requesting colonoscopy and path reports for patient but did not send over a medical release form. Faxed back that we would need a sign medical release form before we could fax them to them. Faxed back to [phone]. Got confirmation fax went through

## 2022-11-27 ENCOUNTER — Other Ambulatory Visit: Payer: Self-pay | Admitting: Gastroenterology

## 2022-11-27 DIAGNOSIS — R1013 Epigastric pain: Secondary | ICD-10-CM

## 2022-11-27 DIAGNOSIS — K30 Functional dyspepsia: Secondary | ICD-10-CM

## 2022-11-27 DIAGNOSIS — R140 Abdominal distension (gaseous): Secondary | ICD-10-CM

## 2022-12-02 ENCOUNTER — Ambulatory Visit: Payer: Federal, State, Local not specified - PPO

## 2022-12-03 ENCOUNTER — Ambulatory Visit
Admission: RE | Admit: 2022-12-03 | Discharge: 2022-12-03 | Disposition: A | Discharge status: Home / Self Care | Payer: Federal, State, Local not specified - PPO | Source: Ambulatory Visit | Attending: Gastroenterology | Admitting: Gastroenterology

## 2022-12-03 DIAGNOSIS — R1013 Epigastric pain: Secondary | ICD-10-CM | POA: Insufficient documentation

## 2022-12-03 DIAGNOSIS — R140 Abdominal distension (gaseous): Secondary | ICD-10-CM | POA: Diagnosis present

## 2022-12-03 DIAGNOSIS — K30 Functional dyspepsia: Secondary | ICD-10-CM | POA: Insufficient documentation

## 2022-12-03 MED ORDER — IOHEXOL 300 MG/ML  SOLN
100.0000 mL | Freq: Once | INTRAMUSCULAR | Status: AC | PRN
  Administered 2022-12-03: 100 mL via INTRAVENOUS

## 2022-12-30 ENCOUNTER — Telehealth: Payer: Self-pay

## 2022-12-30 NOTE — Telephone Encounter (Signed)
Pt contacted office requesting to schedule colonoscopy.  Phone call returned to patient.  She is an established with [facility] Dr. Daleen Squibb (since 10/27/22) her last office visit with her was on 12/11/22 and she has a follow up with them on 02/09/23. She requested all records from our office to be sent to [facility] on 11/04/22.   Informed patient that since she is established with [facility] we cannot perform her colonoscopy.  She said that she wanted Dr. Servando Snare to do her colonoscopy because he has done the last two, and she does not want to drive to [facility] to have the colonoscopy.  Explained to patient that normally this is not allowed, and I would have to get permission from Dr. Servando Snare to allow this.  Previous colonoscopy performed by Dr. Servando Snare 07/16/16 pathology noted enteritis and lymphocytic colitis.  No notation of colon polyps.  Recommendation repeat 10 years in 2028.  Dr. Servando Snare please advise. Patient is not looking to transfer [facility].  Thanks,  [place], [place]

## 2022-12-30 NOTE — Telephone Encounter (Signed)
okay

## 2022-12-30 NOTE — Telephone Encounter (Signed)
Pt requesting call back to schedule colonoscopy.

## 2023-03-30 ENCOUNTER — Other Ambulatory Visit: Payer: Self-pay | Admitting: Internal Medicine

## 2023-03-30 DIAGNOSIS — Z1231 Encounter for screening mammogram for malignant neoplasm of breast: Secondary | ICD-10-CM

## 2023-05-31 ENCOUNTER — Ambulatory Visit
Admission: RE | Admit: 2023-05-31 | Discharge: 2023-05-31 | Disposition: A | Discharge status: Home / Self Care | Payer: Federal, State, Local not specified - PPO | Source: Ambulatory Visit | Attending: Internal Medicine | Admitting: Internal Medicine

## 2023-05-31 DIAGNOSIS — Z1231 Encounter for screening mammogram for malignant neoplasm of breast: Secondary | ICD-10-CM
